# Patient Record
Sex: Male | Born: 1965 | Race: White | Hispanic: No | Marital: Single | Smoking: Former smoker
Health system: Southern US, Community
[De-identification: ages and names within clinical notes are randomized; demographics above are authoritative.]

## PROBLEM LIST (undated history)

## (undated) DIAGNOSIS — E119 Type 2 diabetes mellitus without complications: Secondary | ICD-10-CM

## (undated) DIAGNOSIS — I10 Essential (primary) hypertension: Secondary | ICD-10-CM

## (undated) DIAGNOSIS — N189 Chronic kidney disease, unspecified: Secondary | ICD-10-CM

## (undated) DIAGNOSIS — J449 Chronic obstructive pulmonary disease, unspecified: Secondary | ICD-10-CM

## (undated) DIAGNOSIS — J45909 Unspecified asthma, uncomplicated: Secondary | ICD-10-CM

---

## 2000-10-07 ENCOUNTER — Emergency Department (HOSPITAL_COMMUNITY): Admission: EM | Admit: 2000-10-07 | Discharge: 2000-10-07 | Payer: Self-pay | Admitting: Emergency Medicine

## 2000-10-07 ENCOUNTER — Encounter: Payer: Self-pay | Admitting: Emergency Medicine

## 2019-03-31 ENCOUNTER — Other Ambulatory Visit: Payer: Self-pay | Admitting: Nephrology

## 2019-03-31 DIAGNOSIS — N183 Chronic kidney disease, stage 3 unspecified: Secondary | ICD-10-CM

## 2019-04-20 ENCOUNTER — Ambulatory Visit
Admission: RE | Admit: 2019-04-20 | Discharge: 2019-04-20 | Disposition: A | Discharge status: Home / Self Care | Payer: 59 | Source: Ambulatory Visit | Attending: Nephrology | Admitting: Nephrology

## 2019-04-20 DIAGNOSIS — N183 Chronic kidney disease, stage 3 unspecified: Secondary | ICD-10-CM

## 2019-07-12 DIAGNOSIS — Z0000 Encounter for general adult medical examination without abnormal findings: Secondary | ICD-10-CM | POA: Diagnosis not present

## 2019-07-12 DIAGNOSIS — Z23 Encounter for immunization: Secondary | ICD-10-CM | POA: Diagnosis not present

## 2019-07-12 DIAGNOSIS — Z1211 Encounter for screening for malignant neoplasm of colon: Secondary | ICD-10-CM | POA: Diagnosis not present

## 2019-07-12 DIAGNOSIS — I10 Essential (primary) hypertension: Secondary | ICD-10-CM | POA: Diagnosis not present

## 2019-07-19 DIAGNOSIS — E1122 Type 2 diabetes mellitus with diabetic chronic kidney disease: Secondary | ICD-10-CM | POA: Diagnosis not present

## 2019-07-19 DIAGNOSIS — N183 Chronic kidney disease, stage 3 unspecified: Secondary | ICD-10-CM | POA: Diagnosis not present

## 2019-07-28 DIAGNOSIS — E875 Hyperkalemia: Secondary | ICD-10-CM | POA: Diagnosis not present

## 2019-07-28 DIAGNOSIS — I129 Hypertensive chronic kidney disease with stage 1 through stage 4 chronic kidney disease, or unspecified chronic kidney disease: Secondary | ICD-10-CM | POA: Diagnosis not present

## 2019-07-28 DIAGNOSIS — R809 Proteinuria, unspecified: Secondary | ICD-10-CM | POA: Diagnosis not present

## 2019-07-28 DIAGNOSIS — N183 Chronic kidney disease, stage 3 unspecified: Secondary | ICD-10-CM | POA: Diagnosis not present

## 2019-08-18 DIAGNOSIS — Z20828 Contact with and (suspected) exposure to other viral communicable diseases: Secondary | ICD-10-CM | POA: Diagnosis not present

## 2019-08-18 DIAGNOSIS — J329 Chronic sinusitis, unspecified: Secondary | ICD-10-CM | POA: Diagnosis not present

## 2019-08-18 DIAGNOSIS — J0140 Acute pansinusitis, unspecified: Secondary | ICD-10-CM | POA: Diagnosis not present

## 2019-10-12 DIAGNOSIS — E1165 Type 2 diabetes mellitus with hyperglycemia: Secondary | ICD-10-CM | POA: Diagnosis not present

## 2019-10-12 DIAGNOSIS — E1122 Type 2 diabetes mellitus with diabetic chronic kidney disease: Secondary | ICD-10-CM | POA: Diagnosis not present

## 2019-10-12 DIAGNOSIS — M25512 Pain in left shoulder: Secondary | ICD-10-CM | POA: Diagnosis not present

## 2019-10-12 DIAGNOSIS — E782 Mixed hyperlipidemia: Secondary | ICD-10-CM | POA: Diagnosis not present

## 2019-10-12 DIAGNOSIS — N1832 Chronic kidney disease, stage 3b: Secondary | ICD-10-CM | POA: Diagnosis not present

## 2019-10-12 DIAGNOSIS — Z79899 Other long term (current) drug therapy: Secondary | ICD-10-CM | POA: Diagnosis not present

## 2019-10-12 DIAGNOSIS — I129 Hypertensive chronic kidney disease with stage 1 through stage 4 chronic kidney disease, or unspecified chronic kidney disease: Secondary | ICD-10-CM | POA: Diagnosis not present

## 2019-10-12 DIAGNOSIS — L989 Disorder of the skin and subcutaneous tissue, unspecified: Secondary | ICD-10-CM | POA: Diagnosis not present

## 2019-10-20 DIAGNOSIS — H61002 Unspecified perichondritis of left external ear: Secondary | ICD-10-CM | POA: Diagnosis not present

## 2019-10-20 DIAGNOSIS — H61032 Chondritis of left external ear: Secondary | ICD-10-CM | POA: Diagnosis not present

## 2019-10-20 DIAGNOSIS — D492 Neoplasm of unspecified behavior of bone, soft tissue, and skin: Secondary | ICD-10-CM | POA: Diagnosis not present

## 2019-12-01 DIAGNOSIS — E113592 Type 2 diabetes mellitus with proliferative diabetic retinopathy without macular edema, left eye: Secondary | ICD-10-CM | POA: Diagnosis not present

## 2019-12-01 DIAGNOSIS — E113491 Type 2 diabetes mellitus with severe nonproliferative diabetic retinopathy without macular edema, right eye: Secondary | ICD-10-CM | POA: Diagnosis not present

## 2020-03-23 DIAGNOSIS — H3582 Retinal ischemia: Secondary | ICD-10-CM | POA: Diagnosis not present

## 2020-03-23 DIAGNOSIS — H35033 Hypertensive retinopathy, bilateral: Secondary | ICD-10-CM | POA: Diagnosis not present

## 2020-03-23 DIAGNOSIS — E113491 Type 2 diabetes mellitus with severe nonproliferative diabetic retinopathy without macular edema, right eye: Secondary | ICD-10-CM | POA: Diagnosis not present

## 2020-03-23 DIAGNOSIS — E113592 Type 2 diabetes mellitus with proliferative diabetic retinopathy without macular edema, left eye: Secondary | ICD-10-CM | POA: Diagnosis not present

## 2020-04-03 DIAGNOSIS — E782 Mixed hyperlipidemia: Secondary | ICD-10-CM | POA: Diagnosis not present

## 2020-04-03 DIAGNOSIS — E1122 Type 2 diabetes mellitus with diabetic chronic kidney disease: Secondary | ICD-10-CM | POA: Diagnosis not present

## 2020-04-03 DIAGNOSIS — I10 Essential (primary) hypertension: Secondary | ICD-10-CM | POA: Diagnosis not present

## 2020-04-03 DIAGNOSIS — N1832 Chronic kidney disease, stage 3b: Secondary | ICD-10-CM | POA: Diagnosis not present

## 2020-04-03 DIAGNOSIS — E1165 Type 2 diabetes mellitus with hyperglycemia: Secondary | ICD-10-CM | POA: Diagnosis not present

## 2020-04-03 DIAGNOSIS — I129 Hypertensive chronic kidney disease with stage 1 through stage 4 chronic kidney disease, or unspecified chronic kidney disease: Secondary | ICD-10-CM | POA: Diagnosis not present

## 2020-04-18 DIAGNOSIS — Z1211 Encounter for screening for malignant neoplasm of colon: Secondary | ICD-10-CM | POA: Diagnosis not present

## 2020-06-05 DIAGNOSIS — K635 Polyp of colon: Secondary | ICD-10-CM | POA: Diagnosis not present

## 2020-06-05 DIAGNOSIS — Z1211 Encounter for screening for malignant neoplasm of colon: Secondary | ICD-10-CM | POA: Diagnosis not present

## 2020-06-05 DIAGNOSIS — D120 Benign neoplasm of cecum: Secondary | ICD-10-CM | POA: Diagnosis not present

## 2020-06-05 DIAGNOSIS — K621 Rectal polyp: Secondary | ICD-10-CM | POA: Diagnosis not present

## 2020-07-05 DIAGNOSIS — I10 Essential (primary) hypertension: Secondary | ICD-10-CM | POA: Diagnosis not present

## 2020-07-05 DIAGNOSIS — E1165 Type 2 diabetes mellitus with hyperglycemia: Secondary | ICD-10-CM | POA: Diagnosis not present

## 2020-07-05 DIAGNOSIS — Z23 Encounter for immunization: Secondary | ICD-10-CM | POA: Diagnosis not present

## 2020-07-13 DIAGNOSIS — H3582 Retinal ischemia: Secondary | ICD-10-CM | POA: Diagnosis not present

## 2020-07-13 DIAGNOSIS — E113592 Type 2 diabetes mellitus with proliferative diabetic retinopathy without macular edema, left eye: Secondary | ICD-10-CM | POA: Diagnosis not present

## 2020-07-13 DIAGNOSIS — H35033 Hypertensive retinopathy, bilateral: Secondary | ICD-10-CM | POA: Diagnosis not present

## 2020-07-13 DIAGNOSIS — E113491 Type 2 diabetes mellitus with severe nonproliferative diabetic retinopathy without macular edema, right eye: Secondary | ICD-10-CM | POA: Diagnosis not present

## 2020-10-04 DIAGNOSIS — I10 Essential (primary) hypertension: Secondary | ICD-10-CM | POA: Diagnosis not present

## 2020-10-26 DIAGNOSIS — U071 COVID-19: Secondary | ICD-10-CM | POA: Diagnosis not present

## 2021-01-03 DIAGNOSIS — I129 Hypertensive chronic kidney disease with stage 1 through stage 4 chronic kidney disease, or unspecified chronic kidney disease: Secondary | ICD-10-CM | POA: Diagnosis not present

## 2021-01-03 DIAGNOSIS — N1832 Chronic kidney disease, stage 3b: Secondary | ICD-10-CM | POA: Diagnosis not present

## 2021-01-03 DIAGNOSIS — I10 Essential (primary) hypertension: Secondary | ICD-10-CM | POA: Diagnosis not present

## 2021-01-03 DIAGNOSIS — E782 Mixed hyperlipidemia: Secondary | ICD-10-CM | POA: Diagnosis not present

## 2021-01-03 DIAGNOSIS — E1165 Type 2 diabetes mellitus with hyperglycemia: Secondary | ICD-10-CM | POA: Diagnosis not present

## 2021-01-03 DIAGNOSIS — E1122 Type 2 diabetes mellitus with diabetic chronic kidney disease: Secondary | ICD-10-CM | POA: Diagnosis not present

## 2021-02-12 DIAGNOSIS — I129 Hypertensive chronic kidney disease with stage 1 through stage 4 chronic kidney disease, or unspecified chronic kidney disease: Secondary | ICD-10-CM | POA: Diagnosis not present

## 2021-02-12 DIAGNOSIS — N281 Cyst of kidney, acquired: Secondary | ICD-10-CM | POA: Diagnosis not present

## 2021-02-12 DIAGNOSIS — N184 Chronic kidney disease, stage 4 (severe): Secondary | ICD-10-CM | POA: Diagnosis not present

## 2021-02-12 DIAGNOSIS — E875 Hyperkalemia: Secondary | ICD-10-CM | POA: Diagnosis not present

## 2021-02-12 DIAGNOSIS — R809 Proteinuria, unspecified: Secondary | ICD-10-CM | POA: Diagnosis not present

## 2021-02-14 ENCOUNTER — Other Ambulatory Visit: Payer: Self-pay | Admitting: Nephrology

## 2021-02-14 DIAGNOSIS — N184 Chronic kidney disease, stage 4 (severe): Secondary | ICD-10-CM

## 2021-02-23 ENCOUNTER — Ambulatory Visit
Admission: RE | Admit: 2021-02-23 | Discharge: 2021-02-23 | Disposition: A | Discharge status: Home / Self Care | Payer: 59 | Source: Ambulatory Visit | Attending: Nephrology | Admitting: Nephrology

## 2021-02-23 DIAGNOSIS — N184 Chronic kidney disease, stage 4 (severe): Secondary | ICD-10-CM

## 2021-02-23 DIAGNOSIS — N281 Cyst of kidney, acquired: Secondary | ICD-10-CM | POA: Diagnosis not present

## 2021-04-02 DIAGNOSIS — E782 Mixed hyperlipidemia: Secondary | ICD-10-CM | POA: Diagnosis not present

## 2021-04-02 DIAGNOSIS — N1832 Chronic kidney disease, stage 3b: Secondary | ICD-10-CM | POA: Diagnosis not present

## 2021-04-02 DIAGNOSIS — Z0000 Encounter for general adult medical examination without abnormal findings: Secondary | ICD-10-CM | POA: Diagnosis not present

## 2021-04-02 DIAGNOSIS — Z125 Encounter for screening for malignant neoplasm of prostate: Secondary | ICD-10-CM | POA: Diagnosis not present

## 2021-04-02 DIAGNOSIS — I129 Hypertensive chronic kidney disease with stage 1 through stage 4 chronic kidney disease, or unspecified chronic kidney disease: Secondary | ICD-10-CM | POA: Diagnosis not present

## 2021-04-02 DIAGNOSIS — E1165 Type 2 diabetes mellitus with hyperglycemia: Secondary | ICD-10-CM | POA: Diagnosis not present

## 2021-04-23 DIAGNOSIS — N184 Chronic kidney disease, stage 4 (severe): Secondary | ICD-10-CM | POA: Diagnosis not present

## 2021-05-31 DIAGNOSIS — R809 Proteinuria, unspecified: Secondary | ICD-10-CM | POA: Diagnosis not present

## 2021-05-31 DIAGNOSIS — N2581 Secondary hyperparathyroidism of renal origin: Secondary | ICD-10-CM | POA: Diagnosis not present

## 2021-05-31 DIAGNOSIS — N184 Chronic kidney disease, stage 4 (severe): Secondary | ICD-10-CM | POA: Diagnosis not present

## 2021-05-31 DIAGNOSIS — N281 Cyst of kidney, acquired: Secondary | ICD-10-CM | POA: Diagnosis not present

## 2021-05-31 DIAGNOSIS — E875 Hyperkalemia: Secondary | ICD-10-CM | POA: Diagnosis not present

## 2021-05-31 DIAGNOSIS — I129 Hypertensive chronic kidney disease with stage 1 through stage 4 chronic kidney disease, or unspecified chronic kidney disease: Secondary | ICD-10-CM | POA: Diagnosis not present

## 2021-05-31 DIAGNOSIS — D631 Anemia in chronic kidney disease: Secondary | ICD-10-CM | POA: Diagnosis not present

## 2021-10-31 DIAGNOSIS — E7800 Pure hypercholesterolemia, unspecified: Secondary | ICD-10-CM | POA: Diagnosis not present

## 2021-10-31 DIAGNOSIS — N1832 Chronic kidney disease, stage 3b: Secondary | ICD-10-CM | POA: Diagnosis not present

## 2021-10-31 DIAGNOSIS — E119 Type 2 diabetes mellitus without complications: Secondary | ICD-10-CM | POA: Diagnosis not present

## 2021-10-31 DIAGNOSIS — I10 Essential (primary) hypertension: Secondary | ICD-10-CM | POA: Diagnosis not present

## 2021-11-01 ENCOUNTER — Encounter (HOSPITAL_COMMUNITY): Payer: Self-pay

## 2021-11-01 ENCOUNTER — Inpatient Hospital Stay (HOSPITAL_COMMUNITY): Payer: BC Managed Care – PPO

## 2021-11-01 ENCOUNTER — Other Ambulatory Visit: Payer: Self-pay

## 2021-11-01 ENCOUNTER — Inpatient Hospital Stay (HOSPITAL_COMMUNITY)
Admission: EM | Admit: 2021-11-01 | Discharge: 2021-11-09 | DRG: 673 | Disposition: A | Discharge status: Home / Self Care | Payer: BC Managed Care – PPO | Attending: Internal Medicine | Admitting: Internal Medicine

## 2021-11-01 DIAGNOSIS — I4891 Unspecified atrial fibrillation: Secondary | ICD-10-CM | POA: Diagnosis not present

## 2021-11-01 DIAGNOSIS — E1169 Type 2 diabetes mellitus with other specified complication: Secondary | ICD-10-CM | POA: Diagnosis present

## 2021-11-01 DIAGNOSIS — N184 Chronic kidney disease, stage 4 (severe): Secondary | ICD-10-CM | POA: Diagnosis not present

## 2021-11-01 DIAGNOSIS — I5043 Acute on chronic combined systolic (congestive) and diastolic (congestive) heart failure: Secondary | ICD-10-CM | POA: Diagnosis not present

## 2021-11-01 DIAGNOSIS — R0600 Dyspnea, unspecified: Secondary | ICD-10-CM | POA: Diagnosis not present

## 2021-11-01 DIAGNOSIS — I509 Heart failure, unspecified: Secondary | ICD-10-CM

## 2021-11-01 DIAGNOSIS — Z992 Dependence on renal dialysis: Secondary | ICD-10-CM

## 2021-11-01 DIAGNOSIS — Z7982 Long term (current) use of aspirin: Secondary | ICD-10-CM

## 2021-11-01 DIAGNOSIS — R195 Other fecal abnormalities: Secondary | ICD-10-CM | POA: Diagnosis not present

## 2021-11-01 DIAGNOSIS — Z86010 Personal history of colonic polyps: Secondary | ICD-10-CM

## 2021-11-01 DIAGNOSIS — N179 Acute kidney failure, unspecified: Principal | ICD-10-CM | POA: Diagnosis present

## 2021-11-01 DIAGNOSIS — Z4901 Encounter for fitting and adjustment of extracorporeal dialysis catheter: Secondary | ICD-10-CM | POA: Diagnosis not present

## 2021-11-01 DIAGNOSIS — Z7985 Long-term (current) use of injectable non-insulin antidiabetic drugs: Secondary | ICD-10-CM

## 2021-11-01 DIAGNOSIS — D123 Benign neoplasm of transverse colon: Secondary | ICD-10-CM | POA: Diagnosis not present

## 2021-11-01 DIAGNOSIS — E872 Acidosis, unspecified: Secondary | ICD-10-CM | POA: Diagnosis present

## 2021-11-01 DIAGNOSIS — I517 Cardiomegaly: Secondary | ICD-10-CM | POA: Diagnosis not present

## 2021-11-01 DIAGNOSIS — D649 Anemia, unspecified: Secondary | ICD-10-CM | POA: Diagnosis not present

## 2021-11-01 DIAGNOSIS — N186 End stage renal disease: Secondary | ICD-10-CM | POA: Diagnosis not present

## 2021-11-01 DIAGNOSIS — I129 Hypertensive chronic kidney disease with stage 1 through stage 4 chronic kidney disease, or unspecified chronic kidney disease: Secondary | ICD-10-CM | POA: Diagnosis not present

## 2021-11-01 DIAGNOSIS — D638 Anemia in other chronic diseases classified elsewhere: Secondary | ICD-10-CM | POA: Diagnosis not present

## 2021-11-01 DIAGNOSIS — K2980 Duodenitis without bleeding: Secondary | ICD-10-CM | POA: Diagnosis present

## 2021-11-01 DIAGNOSIS — I132 Hypertensive heart and chronic kidney disease with heart failure and with stage 5 chronic kidney disease, or end stage renal disease: Secondary | ICD-10-CM | POA: Diagnosis not present

## 2021-11-01 DIAGNOSIS — I10 Essential (primary) hypertension: Secondary | ICD-10-CM | POA: Diagnosis present

## 2021-11-01 DIAGNOSIS — E785 Hyperlipidemia, unspecified: Secondary | ICD-10-CM | POA: Diagnosis present

## 2021-11-01 DIAGNOSIS — F17220 Nicotine dependence, chewing tobacco, uncomplicated: Secondary | ICD-10-CM | POA: Diagnosis present

## 2021-11-01 DIAGNOSIS — D62 Acute posthemorrhagic anemia: Secondary | ICD-10-CM | POA: Diagnosis present

## 2021-11-01 DIAGNOSIS — K635 Polyp of colon: Secondary | ICD-10-CM | POA: Diagnosis not present

## 2021-11-01 DIAGNOSIS — D631 Anemia in chronic kidney disease: Secondary | ICD-10-CM | POA: Diagnosis present

## 2021-11-01 DIAGNOSIS — R0602 Shortness of breath: Secondary | ICD-10-CM | POA: Diagnosis present

## 2021-11-01 DIAGNOSIS — N2581 Secondary hyperparathyroidism of renal origin: Secondary | ICD-10-CM | POA: Diagnosis present

## 2021-11-01 DIAGNOSIS — N189 Chronic kidney disease, unspecified: Secondary | ICD-10-CM | POA: Diagnosis not present

## 2021-11-01 DIAGNOSIS — J9811 Atelectasis: Secondary | ICD-10-CM | POA: Diagnosis not present

## 2021-11-01 DIAGNOSIS — K922 Gastrointestinal hemorrhage, unspecified: Secondary | ICD-10-CM

## 2021-11-01 DIAGNOSIS — K5730 Diverticulosis of large intestine without perforation or abscess without bleeding: Secondary | ICD-10-CM | POA: Diagnosis not present

## 2021-11-01 DIAGNOSIS — A048 Other specified bacterial intestinal infections: Secondary | ICD-10-CM | POA: Diagnosis present

## 2021-11-01 DIAGNOSIS — N19 Unspecified kidney failure: Secondary | ICD-10-CM | POA: Diagnosis not present

## 2021-11-01 DIAGNOSIS — Z833 Family history of diabetes mellitus: Secondary | ICD-10-CM | POA: Diagnosis not present

## 2021-11-01 DIAGNOSIS — E1122 Type 2 diabetes mellitus with diabetic chronic kidney disease: Secondary | ICD-10-CM | POA: Diagnosis present

## 2021-11-01 DIAGNOSIS — J9601 Acute respiratory failure with hypoxia: Secondary | ICD-10-CM | POA: Diagnosis present

## 2021-11-01 DIAGNOSIS — Z20822 Contact with and (suspected) exposure to covid-19: Secondary | ICD-10-CM | POA: Diagnosis not present

## 2021-11-01 DIAGNOSIS — K2951 Unspecified chronic gastritis with bleeding: Secondary | ICD-10-CM | POA: Diagnosis not present

## 2021-11-01 DIAGNOSIS — Z79899 Other long term (current) drug therapy: Secondary | ICD-10-CM

## 2021-11-01 DIAGNOSIS — D509 Iron deficiency anemia, unspecified: Secondary | ICD-10-CM | POA: Diagnosis not present

## 2021-11-01 DIAGNOSIS — R918 Other nonspecific abnormal finding of lung field: Secondary | ICD-10-CM | POA: Diagnosis not present

## 2021-11-01 DIAGNOSIS — E8729 Other acidosis: Secondary | ICD-10-CM

## 2021-11-01 DIAGNOSIS — K2950 Unspecified chronic gastritis without bleeding: Secondary | ICD-10-CM | POA: Diagnosis present

## 2021-11-01 DIAGNOSIS — E1110 Type 2 diabetes mellitus with ketoacidosis without coma: Secondary | ICD-10-CM | POA: Diagnosis not present

## 2021-11-01 DIAGNOSIS — D500 Iron deficiency anemia secondary to blood loss (chronic): Secondary | ICD-10-CM | POA: Diagnosis not present

## 2021-11-01 HISTORY — DX: Chronic obstructive pulmonary disease, unspecified: J44.9

## 2021-11-01 HISTORY — DX: Chronic kidney disease, unspecified: N18.9

## 2021-11-01 HISTORY — DX: Type 2 diabetes mellitus without complications: E11.9

## 2021-11-01 HISTORY — DX: Unspecified asthma, uncomplicated: J45.909

## 2021-11-01 HISTORY — DX: Essential (primary) hypertension: I10

## 2021-11-01 LAB — COMPREHENSIVE METABOLIC PANEL
ALT: 21 U/L (ref 0–44)
AST: 13 U/L — ABNORMAL LOW (ref 15–41)
Albumin: 3.9 g/dL (ref 3.5–5.0)
Alkaline Phosphatase: 67 U/L (ref 38–126)
Anion gap: 20 — ABNORMAL HIGH (ref 5–15)
BUN: 140 mg/dL — ABNORMAL HIGH (ref 6–20)
CO2: 18 mmol/L — ABNORMAL LOW (ref 22–32)
Calcium: 7.7 mg/dL — ABNORMAL LOW (ref 8.9–10.3)
Chloride: 98 mmol/L (ref 98–111)
Creatinine, Ser: 14.18 mg/dL — ABNORMAL HIGH (ref 0.61–1.24)
GFR, Estimated: 4 mL/min — ABNORMAL LOW (ref >60)
Glucose, Bld: 113 mg/dL — ABNORMAL HIGH (ref 70–99)
Potassium: 4.4 mmol/L (ref 3.5–5.1)
Sodium: 136 mmol/L (ref 135–145)
Total Bilirubin: 0.7 mg/dL (ref 0.3–1.2)
Total Protein: 6.8 g/dL (ref 6.5–8.1)

## 2021-11-01 LAB — CBC WITH DIFFERENTIAL/PLATELET
Abs Immature Granulocytes: 0.03 10*3/uL (ref 0.00–0.07)
Basophils Absolute: 0 10*3/uL (ref 0.0–0.1)
Basophils Relative: 0 %
Eosinophils Absolute: 0.1 10*3/uL (ref 0.0–0.5)
Eosinophils Relative: 1 %
HCT: 20.8 % — ABNORMAL LOW (ref 39.0–52.0)
Hemoglobin: 7.3 g/dL — ABNORMAL LOW (ref 13.0–17.0)
Immature Granulocytes: 0 %
Lymphocytes Relative: 9 %
Lymphs Abs: 0.7 10*3/uL (ref 0.7–4.0)
MCH: 29.9 pg (ref 26.0–34.0)
MCHC: 35.1 g/dL (ref 30.0–36.0)
MCV: 85.2 fL (ref 80.0–100.0)
Monocytes Absolute: 0.5 10*3/uL (ref 0.1–1.0)
Monocytes Relative: 6 %
Neutro Abs: 6.3 10*3/uL (ref 1.7–7.7)
Neutrophils Relative %: 84 %
Platelets: 127 10*3/uL — ABNORMAL LOW (ref 150–400)
RBC: 2.44 MIL/uL — ABNORMAL LOW (ref 4.22–5.81)
RDW: 13.2 % (ref 11.5–15.5)
WBC: 7.6 10*3/uL (ref 4.0–10.5)
nRBC: 0 % (ref 0.0–0.2)

## 2021-11-01 LAB — URINALYSIS, ROUTINE W REFLEX MICROSCOPIC
Bilirubin Urine: NEGATIVE
Glucose, UA: 150 mg/dL — AB
Ketones, ur: 5 mg/dL — AB
Leukocytes,Ua: NEGATIVE
Nitrite: NEGATIVE
Protein, ur: >=300 mg/dL — AB
Specific Gravity, Urine: 1.01 (ref 1.005–1.030)
pH: 5 (ref 5.0–8.0)

## 2021-11-01 LAB — POC OCCULT BLOOD, ED: Fecal Occult Bld: POSITIVE — AB

## 2021-11-01 LAB — RESP PANEL BY RT-PCR (FLU A&B, COVID) ARPGX2
Influenza A by PCR: NEGATIVE
Influenza B by PCR: NEGATIVE
SARS Coronavirus 2 by RT PCR: NEGATIVE

## 2021-11-01 LAB — BRAIN NATRIURETIC PEPTIDE: B Natriuretic Peptide: 933.7 pg/mL — ABNORMAL HIGH (ref 0.0–100.0)

## 2021-11-01 LAB — PREPARE RBC (CROSSMATCH)

## 2021-11-01 LAB — ABO/RH: ABO/RH(D): A POS

## 2021-11-01 MED ORDER — METOPROLOL TARTRATE 5 MG/5ML IV SOLN
5.0000 mg | Freq: Once | INTRAVENOUS | Status: AC
Start: 2021-11-01 — End: 2021-11-02
  Administered 2021-11-02: 5 mg via INTRAVENOUS
  Filled 2021-11-01: qty 5

## 2021-11-01 MED ORDER — SODIUM CHLORIDE 0.9 % IV SOLN
10.0000 mL/h | Freq: Once | INTRAVENOUS | Status: AC
Start: 2021-11-01 — End: 2021-11-01
  Administered 2021-11-01: 10 mL/h via INTRAVENOUS

## 2021-11-01 MED ORDER — CHLORHEXIDINE GLUCONATE CLOTH 2 % EX PADS
6.0000 | MEDICATED_PAD | Freq: Every day | CUTANEOUS | Status: DC
  Administered 2021-11-02 – 2021-11-07 (×5): 6 via TOPICAL

## 2021-11-01 MED ORDER — FUROSEMIDE 10 MG/ML IJ SOLN
160.0000 mg | Freq: Once | INTRAVENOUS | Status: AC
  Administered 2021-11-01: 160 mg via INTRAVENOUS
  Filled 2021-11-01: qty 16

## 2021-11-01 NOTE — ED Provider Triage Note (Signed)
Emergency Medicine Provider Triage Evaluation Note  Allen Mathis , a 56 y.o. male  was evaluated in triage.  Pt complains of abnormal lab, had urine analysis by PCP which showed protein present.  Patient endorses urinating small amounts for the past 3 weeks, has also had some nausea along with vomiting.  He is followed by Dr. Carolin Sicks of nephrology  Review of Systems  Positive: Urinary symptoms, nausea, vomiting Negative: Fever, flank pain  Physical Exam  BP (!) 176/90 (BP Location: Left Arm)    Pulse 95    Temp 98 F (36.7 C) (Oral)    Resp 18    Ht 5\' 5"  (1.651 m)    Wt 79.4 kg    SpO2 96%    BMI 29.12 kg/m  Gen:   Awake, no distress   Resp:  Normal effort  MSK:   Moves extremities without difficulty  Other:    Medical Decision Making  Medically screening exam initiated at 1:01 PM.  Appropriate orders placed.  [name] was informed that the remainder of the evaluation will be completed by another provider, this initial triage assessment does not replace that evaluation, and the importance of remaining in the ED until their evaluation is complete.     Janeece Fitting, PA-C 11/01/21 1304

## 2021-11-01 NOTE — H&P (Addendum)
History and Physical    Allen Mathis XFG:[id number] DOB: 01/02/1966 DOA: 11/01/2021  PCP: Lyman Bishop, DO  Patient coming from: Home  I have personally briefly reviewed patient's old medical records in [place]  Chief Complaint: nausea, vomiting, diarrhea  HPI: Allen Mathis is a 56 y.o. male with medical history significant for HTN, HLD, Type 2 DM, CKD stage 4 who presents with nausea, vomiting, diarrhea.   He has been feeling sick for about 3 weeks with nausea,vomiting and diarrhea. No abdominal pain. Feels weak and dizzy. Went to a new primary doctor and had blood work yesterday and found to have worsening renal function and asked to come to ED. Past 2 days also more acutely short of breath,has orthopnea, and has mild cough. Slight ankle edema. No chest pain. Has urinary dribbling for about 8 months and feels he is urinating less in the past week.  Denies NSAID use other than daily aspirin.  No alcohol use.  ED Course:  He was afebrile, hypertensive up to 190 over 80s on 6 L.  No leukocytosis, hemoglobin of 7.3 down from 12.7 several months ago. Creatinine of 14 from previous of 3.78 back in June.  Anion gap of 20.  UA shows negative leukocyte, nitrite but had greater than 300 protein.  FOBT positive but per ED PA no melena or bright red blood was noted on exam.  Review of Systems:  No other pertinent positives or negatives other than stated above in HPI  Social history Chews tobacco daily, occasional alcohol use.  Previous history of marijuana use but not recently.  History reviewed. No pertinent surgical history.  No Known Allergies  Family History  Problem Relation Age of Onset   Diabetes Mother    Diabetes Father      Prior to Admission medications   Not on File    Physical Exam: Vitals:   11/01/21 1412 11/01/21 1546 11/01/21 1739 11/01/21 1816  BP: (!) 174/96 (!) 174/89 (!) 203/104 (!) 181/91  Pulse: 84 85 97 96  Resp: 18 17 (!) 25 (!)  25  Temp:   (!) 97.5 F (36.4 C)   TempSrc:   Oral   SpO2: 94% 91% 94% 94%  Weight:      Height:        Constitutional: Ill-appearing elderly gentleman appearing older than stated age sitting upright in bed with labored respiration Vitals:   11/01/21 1412 11/01/21 1546 11/01/21 1739 11/01/21 1816  BP: (!) 174/96 (!) 174/89 (!) 203/104 (!) 181/91  Pulse: 84 85 97 96  Resp: 18 17 (!) 25 (!) 25  Temp:   (!) 97.5 F (36.4 C)   TempSrc:   Oral   SpO2: 94% 91% 94% 94%  Weight:      Height:       Eyes:  lids and conjunctivae normal ENMT: Mucous membranes are moist.  Neck: normal, supple Respiratory: Left base crackles, labored respiration on 6 L via nasal cannula but no use of accessory muscle.  Able to speak in full sentences. cardiovascular: Regular rate and rhythm, no murmurs / rubs / gallops.  +2 pitting edema of distal lower extremity.   Abdomen: no tenderness,  Bowel sounds positive.  Musculoskeletal: no clubbing / cyanosis. No joint deformity upper and lower extremities. Good ROM, no contractures. Normal muscle tone.  Skin: no rashes, lesions, ulcers. No induration Neurologic: CN 2-12 grossly intact.  Strength 5/5 in all 4.  Psychiatric: Normal judgment and insight. Alert and oriented  x 3. Normal mood.     Labs on Admission: I have personally reviewed following labs and imaging studies  CBC: Recent Labs  Lab 11/01/21 1307  WBC 7.6  NEUTROABS 6.3  HGB 7.3*  HCT 20.8*  MCV 85.2  PLT 433*   Basic Metabolic Panel: Recent Labs  Lab 11/01/21 1307  NA 136  K 4.4  CL 98  CO2 18*  GLUCOSE 113*  BUN 140*  CREATININE 14.18*  CALCIUM 7.7*   GFR: Estimated Creatinine Clearance: 5.6 mL/min (A) (by C-G formula based on SCr of 14.18 mg/dL (H)). Liver Function Tests: Recent Labs  Lab 11/01/21 1307  AST 13*  ALT 21  ALKPHOS 67  BILITOT 0.7  PROT 6.8  ALBUMIN 3.9   No results for input(s): LIPASE, AMYLASE in the last 168 hours. No results for input(s): AMMONIA  in the last 168 hours. Coagulation Profile: No results for input(s): INR, PROTIME in the last 168 hours. Cardiac Enzymes: No results for input(s): CKTOTAL, CKMB, CKMBINDEX, TROPONINI in the last 168 hours. BNP (last 3 results) No results for input(s): PROBNP in the last 8760 hours. HbA1C: No results for input(s): HGBA1C in the last 72 hours. CBG: No results for input(s): GLUCAP in the last 168 hours. Lipid Profile: No results for input(s): CHOL, HDL, LDLCALC, TRIG, CHOLHDL, LDLDIRECT in the last 72 hours. Thyroid Function Tests: No results for input(s): TSH, T4TOTAL, FREET4, T3FREE, THYROIDAB in the last 72 hours. Anemia Panel: No results for input(s): VITAMINB12, FOLATE, FERRITIN, TIBC, IRON, RETICCTPCT in the last 72 hours. Urine analysis:    Component Value Date/Time   COLORURINE STRAW (A) 11/01/2021 1307   APPEARANCEUR CLEAR 11/01/2021 1307   LABSPEC 1.010 11/01/2021 1307   PHURINE 5.0 11/01/2021 1307   GLUCOSEU 150 (A) 11/01/2021 1307   HGBUR MODERATE (A) 11/01/2021 1307   BILIRUBINUR NEGATIVE 11/01/2021 1307   KETONESUR 5 (A) 11/01/2021 1307   PROTEINUR >=300 (A) 11/01/2021 1307   NITRITE NEGATIVE 11/01/2021 1307   LEUKOCYTESUR NEGATIVE 11/01/2021 1307    Radiological Exams on Admission: No results found.    Assessment/Plan  Acute renal failure w/hx of CKD stage IV -Creatinine of 14 from a prior of 3.78 back in June -renal ultrasound ruled out post-void obstruction -Pt to be transferred to [facility] for dialysis catheter placement and dialysis tomorrow -Monitor Is and Os, daily weights  -Avoid nephrotoxic agents, contrast study.  Renally dose medications. -Nephrology is following.  Appreciate recommendations.  Acute hypoxic respiratory failure -Multifactorial from suspected new CHF exacerbation, symptomatic anemia, volume overload from renal insufficiency -IV diuresis per nephrology overnight.  Hemodialysis in the morning.  New acute CHF exacerbation -CXR  obtained showing cardiomegaly with bilateral pleural effusion. BNP of 933 -obtain echocardiogram -IV Lasix 160mg  per nephrology. HD tomorrow.  -Strict intake and output, monitor daily weights  New onset atrial fibrillation with RVR -induced by new CHF, fluid overload -give one time dose of IV metoprolol and continue to monitor on telemetry -CHA2DS2-VASc score of 3 but not good candidate to start anticoagulation with symptomatic anemia and suspected GI bleed.   Symptomatic anemia -Hemoglobin of 7.3 down from 12.7 since June 2022 -suspect anemia of CKD and also possible GI source with positive FOBT -Last colonoscopy in 05/2020 at [place] to see results but pt says he did not have good prep and was advised to go back for repeat. ED PA did discuss with GI Dr. Michail Sermon and they will see in consultation. -will need to hold on transfusion  while he is fluid overloaded. Likely will need transfusion following dialysis. Appreciate nephrology on possible EPO.   Anion gap Metabolic acidosis secondary to Acute renal failure. Monitor after dialysis.   Type 2 diabetes Obtain hemoglobin A1c Hold on any insulin for now due to worsening renal function  Hypertension elevated. Received IV Lasix overnight. Continue amlodipine. Follow with HD tomorrow   Hyperlipidemia Continue statin  DVT prophylaxis:SCD Code Status: Full Family Communication: Plan discussed with patient and son at bedside  disposition Plan: Home with at least 2 midnight stays  Consults called: Nephrology Admission status: inpatient  Level of care: Telemetry Medical  Status is: Inpatient  Remains inpatient appropriate because: Admit - It is my clinical opinion that admission to INPATIENT is reasonable and necessary because this patient will require at least 2 midnights in the hospital to treat this condition based on the medical complexity of the problems presented.  Given the aforementioned information, the  predictability of an adverse outcome is felt to be significant.         Orene Desanctis DO [facility]   If 7PM-7AM, please contact night-coverage [email or web address]   11/01/2021, 7:22 PM

## 2021-11-01 NOTE — ED Notes (Signed)
Pt placed on 2L nasal cannula O2 levels did not come up until pt placed on 6 lpm and came up to 95%

## 2021-11-01 NOTE — ED Triage Notes (Addendum)
Patient was notified by his PCP that he had elevated protein in his urine and to come to the ED for further evaluation.  Patient added that he has been voiding frequent small amounts.

## 2021-11-01 NOTE — ED Notes (Signed)
Called lab and verified able to add BNP to previous drawn bloodwork

## 2021-11-01 NOTE — Consult Note (Signed)
Nephrology Consult   Requesting provider: Ileene Musa Service requesting consult: Hospitalist Reason for consult: AKI on CKD IV/V   Assessment/Recommendations: Allen Mathis is a/an 56 y.o. male with a past medical history DM2, HTN, CKD, HLD who present w/ acute renal failure   Severe acute renal failure on CKD IV/V: with uremic symptoms and likely excess volume. Given his fairly rapid progression I think this may represent advancement of his severe CKD but a component of AKI is possible -NPO at [facility] for dialysis catheter -IR consulted for [facility] placement; ideally [facility] but if he is too unstable can do temporary HD catheter more emergently -Dialysis ordered for tomorrow -Management of volume excess as below  -Continue to monitor daily Cr, Dose meds for GFR -Monitor Daily I/Os, Daily weight  -Maintain MAP>65 for optimal renal perfusion.  -Avoid nephrotoxic medications including NSAIDs and Vanc/Zosyn combo  Shortness of breath/volume overload: very dyspneic in the room likely related to volume overload and pulmonary edema -IV lasix 160mg  x1 -BNP and Chest x-ray -consider echocardiogram  Hypertension: diuresis as above. Consider additional agents PRN  Blood loss Anemia: possible GI bleeding. CKD playing a role. Would hold on transfusion until breathing status has improved with lasix. Then transfusions PRN  DM2: poorly controlled previously. mgmt per primary  Anion gap metabolic acidosis: likely mainly from renal failure. Plan for dialysis as above  Hypocalcemia: check PTH and phos   Recommendations conveyed to primary service.    [facility] 11/01/2021 8:00 PM   _____________________________________________________________________________________ CC: AKI on CKD 4/5  History of Present Illness: Allen Mathis is a/an 56 y.o. male with a past medical history of HTN, HLD DM2 who presents with abnormal labs found by PCP as well as SOB and  nausea.  Patient states that he felt himself about 4 weeks ago but over the past few weeks he has experienced worsening nausea and vomiting. He also has had several weeks of intermittent watery, non-bloody diarrhea. He has had a difficult time keeping food down and appetite has been poor. Over the past few days he has also noted worsening SOB and orthopnea. He has not had chest pain, fevers, or chills. He has not had hematuria but does say that his stream has been weak for about 6 months and sometimes it is difficult to void. He feels like he has continued to make a fair amount of urine. He also feels very weak and tired. Denies any NSAID use and does not take an ARB. He has not followed closely with his primary provider due to an insurance issue.  In the ER he was found to have a Crt of 14 and BUN of 140. Bicarb 18. UA with protein, moderate blood, but minimal cellularity. Hgb was 7.2 and fecal occult +. He has been hypertensive. RUS without obstruction.  Patient has followed with Dr. Carolin Sicks at [facility] and was last seen in August of 2022. He was lost to follow up but in August his creatinine was 4. Previously UPC was 4g. It is thought his CKD is 2/2 diabetes. Based on previous labs it appears that his Crt has worsened fairly rapidly in the past; going from 2 in 2021 to 4 in August of 2022.    Medications:  Current Facility-Administered Medications  Medication Dose Route Frequency Provider Last Rate Last Admin   furosemide (LASIX) 160 mg in dextrose 5 % 50 mL IVPB  160 mg Intravenous Once Reesa Chew, MD  Current Outpatient Medications  Medication Sig Dispense Refill   amLODipine (NORVASC) 5 MG tablet Take 1 tablet by mouth daily.     aspirin 81 MG EC tablet Take by mouth.     atorvastatin (LIPITOR) 20 MG tablet Take 20 mg by mouth daily.     omega-3 acid ethyl esters (LOVAZA) 1 g capsule Take 1 g by mouth daily.     TRULICITY 2.35 TD/3.2KG SOPN Inject 0.75 mg into the skin once a week.      vitamin B-12 (CYANOCOBALAMIN) 100 MCG tablet Take 100 mcg by mouth daily.       ALLERGIES Patient has no known allergies.  MEDICAL HISTORY History reviewed. No pertinent past medical history.   SOCIAL HISTORY Social History   Socioeconomic History   Marital status: Single    Spouse name: Not on file   Number of children: Not on file   Years of education: Not on file   Highest education level: Not on file  Occupational History   Not on file  Tobacco Use   Smoking status: Former    Types: Cigarettes   Smokeless tobacco: Current    Types: Snuff  Vaping Use   Vaping Use: Never used  Substance and Sexual Activity   Alcohol use: Yes   Drug use: Yes    Types: Marijuana   Sexual activity: Not on file  Other Topics Concern   Not on file  Social History Narrative   Not on file   Social Determinants of Health   Financial Resource Strain: Not on file  Food Insecurity: Not on file  Transportation Needs: Not on file  Physical Activity: Not on file  Stress: Not on file  Social Connections: Not on file  Intimate Partner Violence: Not on file     FAMILY HISTORY Family History  Problem Relation Age of Onset   Diabetes Mother    Diabetes Father       Review of Systems: 12 systems reviewed Otherwise as per HPI, all other systems reviewed and negative  Physical Exam: Vitals:   11/01/21 1915 11/01/21 1917  BP: (!) 182/91   Pulse: 97 94  Resp: (!) 23 (!) 23  Temp:    SpO2: 96% 96%   No intake/output data recorded. No intake or output data in the 24 hours ending 11/01/21 2000 General: ill appearing, mild distress HEENT: anicteric sclera, oropharynx clear without lesions CV: normal rate, no audible murmur, no rub Lungs: decreased breath sounds at the bases, mild crackles heard in right mid lung field, iwob Abd: soft, non-tender, non-distended Skin: no visible lesions or rashes Psych: alert, engaged, appropriate mood and affect Musculoskeletal: no obvious  deformities Neuro: normal speech, no gross focal deficits   Test Results Reviewed Lab Results  Component Value Date   NA 136 11/01/2021   K 4.4 11/01/2021   CL 98 11/01/2021   CO2 18 (L) 11/01/2021   BUN 140 (H) 11/01/2021   CREATININE 14.18 (H) 11/01/2021   CALCIUM 7.7 (L) 11/01/2021   ALBUMIN 3.9 11/01/2021     I have reviewed all relevant outside healthcare records related to the patient's current hospitalization

## 2021-11-01 NOTE — ED Provider Notes (Signed)
[facility] DEPT Provider Note   CSN: [id number] Arrival date & time: 11/01/21  1152     History  Chief Complaint  Patient presents with   Abnormal Lab    Allen Mathis is a 56 y.o. male.  The history is provided by the patient and a relative. No language interpreter was used.  Abnormal Lab  56 year old male sent here at the recommendation of his PCP due to having elevated protein in his urine.  Patient admits that he has a history of chronic kidney disease Stage IV.  He does follow-up with [facility].  He was seen by a PCP yesterday for regular well check and was notified today that his kidney function is abnormal and he needs to come to the ER for further assessment.  Patient admits for the past week and a half he has been feeling generalized weak.  He also endorsed having bouts of nausea, include with vomiting and having diarrhea he denies any abnormal bleeding no black tarry stool.  He denies alcohol abuse but admits to tobacco dipping.  He denies any chest pain or trouble breathing but does endorse generalized weakness.  He denies any recent medication changes.  No fever or cold symptoms.  He does make urine.  Report weak stream.  Home Medications Prior to Admission medications   Not on File      Allergies    Patient has no known allergies.    Review of Systems   Review of Systems  All other systems reviewed and are negative.  Physical Exam Updated Vital Signs BP (!) 174/89    Pulse 85    Temp 98 F (36.7 C) (Oral)    Resp 17    Ht 5\' 5"  (1.651 m)    Wt 79.4 kg    SpO2 91%    BMI 29.12 kg/m  Physical Exam Vitals and nursing note reviewed.  Constitutional:      General: He is not in acute distress.    Appearance: He is well-developed.  HENT:     Head: Atraumatic.  Eyes:     Conjunctiva/sclera: Conjunctivae normal.  Cardiovascular:     Rate and Rhythm: Normal rate and regular rhythm.     Pulses: Normal pulses.     Heart  sounds: Normal heart sounds.  Pulmonary:     Effort: Pulmonary effort is normal.     Breath sounds: Normal breath sounds.  Abdominal:     Palpations: Abdomen is soft.     Tenderness: There is no abdominal tenderness. There is no right CVA tenderness or left CVA tenderness.  Genitourinary:    Comments: Chaperone present during exam.  Normal rectal tone, no obvious mass, normal color stool on glove no obvious bleeding noted Musculoskeletal:     Cervical back: Neck supple.     Right lower leg: Edema present.     Left lower leg: Edema present.     Comments: Global weakness but equal strength.  Slight tremors  Trace edema to bilateral lower extremities  Skin:    Findings: No rash.  Neurological:     Mental Status: He is alert and oriented to person, place, and time.  Psychiatric:        Mood and Affect: Mood normal.    ED Results / Procedures / Treatments   Labs (all labs ordered are listed, but only abnormal results are displayed) Labs Reviewed  CBC WITH DIFFERENTIAL/PLATELET - Abnormal; Notable for the following components:  Result Value   RBC 2.44 (*)    Hemoglobin 7.3 (*)    HCT 20.8 (*)    Platelets 127 (*)    All other components within normal limits  COMPREHENSIVE METABOLIC PANEL - Abnormal; Notable for the following components:   CO2 18 (*)    Glucose, Bld 113 (*)    BUN 140 (*)    Creatinine, Ser 14.18 (*)    Calcium 7.7 (*)    AST 13 (*)    GFR, Estimated 4 (*)    Anion gap 20 (*)    All other components within normal limits  URINALYSIS, ROUTINE W REFLEX MICROSCOPIC - Abnormal; Notable for the following components:   Color, Urine STRAW (*)    Glucose, UA 150 (*)    Hgb urine dipstick MODERATE (*)    Ketones, ur 5 (*)    Protein, ur >=300 (*)    Bacteria, UA RARE (*)    All other components within normal limits  POC OCCULT BLOOD, ED - Abnormal; Notable for the following components:   Fecal Occult Bld POSITIVE (*)    All other components within normal  limits  URINE CULTURE  RESP PANEL BY RT-PCR (FLU A&B, COVID) ARPGX2  TYPE AND SCREEN  PREPARE RBC (CROSSMATCH)    EKG None  Radiology No results found.  Procedures .Critical Care Performed by: Domenic Moras, PA-C Authorized by: Domenic Moras, PA-C   Critical care provider statement:    Critical care time (minutes):  45   Critical care was time spent personally by me on the following activities:  Development of treatment plan with patient or surrogate, discussions with consultants, evaluation of patient's response to treatment, examination of patient, ordering and review of laboratory studies, ordering and review of radiographic studies, ordering and performing treatments and interventions, pulse oximetry, re-evaluation of patient's condition and review of old charts    Medications Ordered in ED Medications  0.9 %  sodium chloride infusion (10 mL/hr Intravenous New Bag/Given 11/01/21 1816)    ED Course/ Medical Decision Making/ A&P                           Medical Decision Making Amount and/or Complexity of Data Reviewed Labs: ordered. ECG/medicine tests: ordered.  Risk Prescription drug management.   BP (!) 174/89    Pulse 85    Temp 98 F (36.7 C) (Oral)    Resp 17    Ht 5\' 5"  (1.651 m)    Wt 79.4 kg    SpO2 91%    BMI 29.12 kg/m   4:47 PM This is a 56 year old male significant history of chronic kidney disease who presenting at the request of his PCP for concerns of kidney failure.  Work-up today remarkable for a BUN of 140, creatinine of 14.1  I do not have any prior value for comparison.  4:58 PM Patient has a hemoglobin of 7.3.  No prior value for comparison.  His anemia may be due to chronic kidney disease however since patient is symptomatic which includes weakness and dizziness, I discussed with patient and he is amenable for blood transfusion.  I did perform a digital rectal exam with normal color stool on glove, fecal occult blood test positive.  Will consult GI  for involvement.  5:40 PM I reviewed patient's outside notes from March 2022 when patient's creatinine was 3 at that time.  Therefore changed today significantly different from prior.  I have independently review  and interpret patient's labs.  5:50 PM Appreciate consultation from on call nephrologist Dr. Janan Halter who request pt to be transfer to [facility] for dialysis.  I have also contact [facility] Dr. Michail Sermon in regard to the fecal occult positive test and a Hgb of 7.3, they are agree to be involve in the care as well.  Will consult medicine for admission  I have also request 1 unit of PRBC to be transfuse.  Pt is agreeable with this plan.   6:36 PM Appreciate consultation from [facility] Dr. Flossie Buffy who agrees to see and will admit pt to [facility] for further care.   This patient presents to the ED for concern of acute renal failure, this involves an extensive number of treatment options, and is a complaint that carries with it a high risk of complications and morbidity.  The differential diagnosis includes nephrotic syndrome, ATN, nephrotoxicity, CKD, GIB, anemia, dehydration.  Co morbidities that complicate the patient evaluation CKD, DM Additional history obtained:  Additional history obtained from PCP visits External records from outside source obtained and reviewed including annual physical exam from 04/02/21 when his Hgb was 12, and Cr 3.0  Lab Tests:  I Ordered, and personally interpreted labs.  The pertinent results include:  BUN 140, Cr 14   Medicines ordered and prescription drug management:  I ordered medication including PRBC  for anemia Reevaluation of the patient after these medicines showed that the patient improved I have reviewed the patients home medicines and have made adjustments as needed   Critical Interventions: blood transfusion  Consultations Obtained:  I requested consultation with the GI specialist, nephrologist and hospitalist,  and discussed lab and  imaging findings as well as pertinent plan - they recommend: admission for dialysis and further work up  Problem List / ED Course: acute renal failure, symptomatic anemia, n/v/d  Reevaluation:  After the interventions noted above, I reevaluated the patient and found that they have :improved   Dispostion:  After consideration of the diagnostic results and the patients response to treatment, I feel that the patent would benefit from admission.         Final Clinical Impression(s) / ED Diagnoses Final diagnoses:  Symptomatic anemia  Acute renal failure, unspecified acute renal failure type [facility])    Rx / DC Orders ED Discharge Orders     None         Domenic Moras, PA-C 11/01/21 1843    Fredia Sorrow, MD 11/04/21 [phone]

## 2021-11-01 NOTE — ED Notes (Signed)
IV attempt x2 without success.

## 2021-11-02 ENCOUNTER — Inpatient Hospital Stay (HOSPITAL_COMMUNITY): Payer: BC Managed Care – PPO

## 2021-11-02 DIAGNOSIS — E8729 Other acidosis: Secondary | ICD-10-CM

## 2021-11-02 DIAGNOSIS — N179 Acute kidney failure, unspecified: Secondary | ICD-10-CM | POA: Diagnosis not present

## 2021-11-02 DIAGNOSIS — I10 Essential (primary) hypertension: Secondary | ICD-10-CM | POA: Diagnosis present

## 2021-11-02 DIAGNOSIS — D649 Anemia, unspecified: Secondary | ICD-10-CM | POA: Diagnosis not present

## 2021-11-02 DIAGNOSIS — J9601 Acute respiratory failure with hypoxia: Secondary | ICD-10-CM | POA: Diagnosis not present

## 2021-11-02 DIAGNOSIS — E1169 Type 2 diabetes mellitus with other specified complication: Secondary | ICD-10-CM

## 2021-11-02 DIAGNOSIS — I4891 Unspecified atrial fibrillation: Secondary | ICD-10-CM | POA: Diagnosis present

## 2021-11-02 DIAGNOSIS — I517 Cardiomegaly: Secondary | ICD-10-CM | POA: Diagnosis not present

## 2021-11-02 DIAGNOSIS — I509 Heart failure, unspecified: Secondary | ICD-10-CM

## 2021-11-02 HISTORY — PX: IR FLUORO GUIDE CV LINE RIGHT: IMG2283

## 2021-11-02 LAB — HEPATITIS B SURFACE ANTIGEN: Hepatitis B Surface Ag: NONREACTIVE

## 2021-11-02 LAB — IRON AND TIBC
Iron: 30 ug/dL — ABNORMAL LOW (ref 45–182)
Saturation Ratios: 11 % — ABNORMAL LOW (ref 17.9–39.5)
TIBC: 276 ug/dL (ref 250–450)
UIBC: 246 ug/dL

## 2021-11-02 LAB — BASIC METABOLIC PANEL
Anion gap: 22 — ABNORMAL HIGH (ref 5–15)
BUN: 145 mg/dL — ABNORMAL HIGH (ref 6–20)
CO2: 14 mmol/L — ABNORMAL LOW (ref 22–32)
Calcium: 8.1 mg/dL — ABNORMAL LOW (ref 8.9–10.3)
Chloride: 101 mmol/L (ref 98–111)
Creatinine, Ser: 15.06 mg/dL — ABNORMAL HIGH (ref 0.61–1.24)
GFR, Estimated: 3 mL/min — ABNORMAL LOW (ref >60)
Glucose, Bld: 106 mg/dL — ABNORMAL HIGH (ref 70–99)
Potassium: 4.7 mmol/L (ref 3.5–5.1)
Sodium: 137 mmol/L (ref 135–145)

## 2021-11-02 LAB — HIV ANTIBODY (ROUTINE TESTING W REFLEX): HIV Screen 4th Generation wRfx: NONREACTIVE

## 2021-11-02 LAB — CBC
HCT: 21.4 % — ABNORMAL LOW (ref 39.0–52.0)
Hemoglobin: 7.5 g/dL — ABNORMAL LOW (ref 13.0–17.0)
MCH: 29.3 pg (ref 26.0–34.0)
MCHC: 35 g/dL (ref 30.0–36.0)
MCV: 83.6 fL (ref 80.0–100.0)
Platelets: 126 10*3/uL — ABNORMAL LOW (ref 150–400)
RBC: 2.56 MIL/uL — ABNORMAL LOW (ref 4.22–5.81)
RDW: 13.2 % (ref 11.5–15.5)
WBC: 8.1 10*3/uL (ref 4.0–10.5)
nRBC: 0 % (ref 0.0–0.2)

## 2021-11-02 LAB — PHOSPHORUS: Phosphorus: 11.1 mg/dL — ABNORMAL HIGH (ref 2.5–4.6)

## 2021-11-02 LAB — ECHOCARDIOGRAM COMPLETE
Area-P 1/2: 4.93 cm2
Height: 65 in
S' Lateral: 4 cm
Weight: 2655.4 oz

## 2021-11-02 LAB — HEMOGLOBIN A1C
Hgb A1c MFr Bld: 4.8 % (ref 4.8–5.6)
Mean Plasma Glucose: 91.06 mg/dL

## 2021-11-02 LAB — URINE CULTURE: Culture: NO GROWTH

## 2021-11-02 LAB — HEPATITIS B SURFACE ANTIBODY,QUALITATIVE: Hep B S Ab: NONREACTIVE

## 2021-11-02 LAB — FERRITIN: Ferritin: 420 ng/mL — ABNORMAL HIGH (ref 24–336)

## 2021-11-02 LAB — TSH: TSH: 1.77 u[IU]/mL (ref 0.350–4.500)

## 2021-11-02 LAB — PREPARE RBC (CROSSMATCH)

## 2021-11-02 MED ORDER — AMLODIPINE BESYLATE 5 MG PO TABS
5.0000 mg | ORAL_TABLET | Freq: Every day | ORAL | Status: DC
  Administered 2021-11-02: 5 mg via ORAL
  Filled 2021-11-02: qty 1

## 2021-11-02 MED ORDER — GELATIN ABSORBABLE 12-7 MM EX MISC
CUTANEOUS | Status: AC
  Filled 2021-11-02: qty 1

## 2021-11-02 MED ORDER — METOPROLOL SUCCINATE ER 50 MG PO TB24
50.0000 mg | ORAL_TABLET | Freq: Every day | ORAL | Status: DC
  Administered 2021-11-03 – 2021-11-08 (×5): 50 mg via ORAL
  Filled 2021-11-02 (×5): qty 1

## 2021-11-02 MED ORDER — PENTAFLUOROPROP-TETRAFLUOROETH EX AERO: 1.0000 "application " | INHALATION_SPRAY | CUTANEOUS | Status: DC | PRN

## 2021-11-02 MED ORDER — DARBEPOETIN ALFA 200 MCG/0.4ML IJ SOSY: 200.0000 ug | PREFILLED_SYRINGE | INTRAMUSCULAR | Status: DC

## 2021-11-02 MED ORDER — NEPRO/CARBSTEADY PO LIQD
237.0000 mL | Freq: Two times a day (BID) | ORAL | Status: DC
  Administered 2021-11-03 – 2021-11-05 (×4): 237 mL via ORAL

## 2021-11-02 MED ORDER — MIDAZOLAM HCL 2 MG/2ML IJ SOLN
INTRAMUSCULAR | Status: AC
  Filled 2021-11-02: qty 2

## 2021-11-02 MED ORDER — CEFAZOLIN SODIUM-DEXTROSE 2-4 GM/100ML-% IV SOLN
INTRAVENOUS | Status: AC | PRN
  Administered 2021-11-02: 2 g via INTRAVENOUS

## 2021-11-02 MED ORDER — LIDOCAINE HCL (PF) 1 % IJ SOLN
5.0000 mL | INTRAMUSCULAR | Status: DC | PRN
  Filled 2021-11-02: qty 5

## 2021-11-02 MED ORDER — PERFLUTREN LIPID MICROSPHERE
1.0000 mL | INTRAVENOUS | Status: AC | PRN
  Administered 2021-11-02: 2 mL via INTRAVENOUS
  Filled 2021-11-02: qty 10

## 2021-11-02 MED ORDER — MIDAZOLAM HCL 2 MG/2ML IJ SOLN
INTRAMUSCULAR | Status: AC | PRN
  Administered 2021-11-02: .5 mg via INTRAVENOUS

## 2021-11-02 MED ORDER — CEFAZOLIN SODIUM-DEXTROSE 2-4 GM/100ML-% IV SOLN
INTRAVENOUS | Status: AC
  Filled 2021-11-02: qty 100

## 2021-11-02 MED ORDER — ALTEPLASE 2 MG IJ SOLR: 2.0000 mg | Freq: Once | INTRAMUSCULAR | Status: DC | PRN

## 2021-11-02 MED ORDER — SODIUM CHLORIDE 0.9 % IV SOLN: 100.0000 mL | INTRAVENOUS | Status: DC | PRN

## 2021-11-02 MED ORDER — FUROSEMIDE 10 MG/ML IJ SOLN
200.0000 mg | Freq: Two times a day (BID) | INTRAVENOUS | Status: DC
  Administered 2021-11-03 – 2021-11-05 (×5): 200 mg via INTRAVENOUS
  Filled 2021-11-02 (×8): qty 20

## 2021-11-02 MED ORDER — LIDOCAINE-PRILOCAINE 2.5-2.5 % EX CREA
1.0000 "application " | TOPICAL_CREAM | CUTANEOUS | Status: DC | PRN
  Filled 2021-11-02: qty 5

## 2021-11-02 MED ORDER — FENTANYL CITRATE (PF) 100 MCG/2ML IJ SOLN
INTRAMUSCULAR | Status: AC
  Filled 2021-11-02: qty 2

## 2021-11-02 MED ORDER — HEPARIN SODIUM (PORCINE) 1000 UNIT/ML DIALYSIS
1000.0000 [IU] | INTRAMUSCULAR | Status: DC | PRN
  Filled 2021-11-02: qty 1

## 2021-11-02 MED ORDER — METOPROLOL TARTRATE 5 MG/5ML IV SOLN
5.0000 mg | Freq: Once | INTRAVENOUS | Status: AC
Start: 2021-11-02 — End: 2021-11-02
  Administered 2021-11-02: 5 mg via INTRAVENOUS
  Filled 2021-11-02: qty 5

## 2021-11-02 MED ORDER — SODIUM CHLORIDE 0.9% IV SOLUTION: Freq: Once | INTRAVENOUS | Status: DC

## 2021-11-02 MED ORDER — HEPARIN SODIUM (PORCINE) 1000 UNIT/ML IJ SOLN
INTRAMUSCULAR | Status: AC
  Filled 2021-11-02: qty 10

## 2021-11-02 MED ORDER — FUROSEMIDE 10 MG/ML IJ SOLN
200.0000 mg | Freq: Once | INTRAVENOUS | Status: AC
  Administered 2021-11-02: 200 mg via INTRAVENOUS
  Filled 2021-11-02: qty 20

## 2021-11-02 MED ORDER — FENTANYL CITRATE (PF) 100 MCG/2ML IJ SOLN
INTRAMUSCULAR | Status: AC | PRN
  Administered 2021-11-02: 25 ug via INTRAVENOUS

## 2021-11-02 MED ORDER — LIDOCAINE HCL 1 % IJ SOLN
INTRAMUSCULAR | Status: AC
  Filled 2021-11-02: qty 20

## 2021-11-02 MED ORDER — ATORVASTATIN CALCIUM 10 MG PO TABS
20.0000 mg | ORAL_TABLET | Freq: Every day | ORAL | Status: DC
  Administered 2021-11-02 – 2021-11-09 (×7): 20 mg via ORAL
  Filled 2021-11-02 (×7): qty 2

## 2021-11-02 NOTE — Progress Notes (Signed)
RT called to assess pt for SOB. O2 increased to 6L [place] prior to RT's arrival, but pt is in obvious distress. Pt has very diminished bbs with fine crackles noted. Pt placed on BIPAP at this time per RT protocol. Pt achieving volumes of around 567mL, and does appear to be more comfortable on BIPAP. Pt states he is starting to breathe easier. RT will continue to monitor.

## 2021-11-02 NOTE — Procedures (Signed)
Interventional Radiology Procedure:   Indications: ESRD and needs hemodialysis  Procedure: Tunneled dialysis catheter placement  Findings: Right jugular Palindrome, 19 cm tip to cuff, tip at SVC/RA junction  Complications: No immediate complications noted.     EBL: Minimal  Plan: Dialysis catheter is ready to use.     Imelda Dandridge R. Anselm Pancoast, MD  Pager: [phone]

## 2021-11-02 NOTE — Progress Notes (Signed)
°  Transition of Care [facility]) Screening Note   Patient Details  Name: Allen Mathis Date of Birth: 03-15-1966   Transition of Care [facility]) CM/SW Contact:    Benard Halsted, LCSW Phone Number: 11/02/2021, 9:38 AM    Transition of Care Department [facility]) has reviewed patient and no TOC needs have been identified at this time. We will continue to monitor patient advancement through interdisciplinary progression rounds. If new patient transition needs arise, please place a TOC consult.

## 2021-11-02 NOTE — ED Notes (Signed)
Report given to [facility]. 

## 2021-11-02 NOTE — Progress Notes (Signed)
Subjective:  Appreciate IR-  should be getting temp HD cath placed soon followed by HD- still dyspneic but is making some urine with high dose lasix and actually feels better -  ultrasound did not show obstruction-  u/a with moderate hgb but no cells   Objective Vital signs in last 24 hours: Vitals:   11/02/21 0727 11/02/21 0751 11/02/21 0800 11/02/21 0930  BP: (!) 168/89 (!) 168/89 (!) 150/82   Pulse: 89 86 82 92  Resp: 20 (!) 21 13 (!) 22  Temp:   97.7 F (36.5 C)   TempSrc:   Axillary   SpO2: 97% 99% 100% 94%  Weight:      Height:       Weight change:   Intake/Output Summary (Last 24 hours) at 11/02/2021 1149 Last data filed at 11/02/2021 1049 Gross per 24 hour  Intake 50 ml  Output 900 ml  Net -850 ml    Assessment/ Plan: Pt is a 56 y.o. yo male who was admitted on 11/01/2021 with uremia and volume overload in the setting of worsening renal failure  Assessment/Plan: 1. Renal-  crt of 4   6 mos ago-  small echogenic kidneys on u/s-  we all suspect that this is just progression of ckd and now ESRD but could have acute component as well-  support with HD today and second treatment for tomorrow  and then monitor-  if is looking like esrd will put plans in place for permanent access and OP placement  2. HTN/vol-  overloaded-  some UOP with big dose diuretics to keep him out of trouble and also utilizing HD.  Continue diuretics for now -  anticipate BP will improve as volume improves  3. Anemia-   most likely due to CKD-  will check iron stores and give esa-  4. Secondary hyperparathyroidism- PTH pending-  will see what phos does with HD for now    [place]: Basic Metabolic Panel: Recent Labs  Lab 11/01/21 1307 11/02/21 0652  NA 136 137  K 4.4 4.7  CL 98 101  CO2 18* 14*  GLUCOSE 113* 106*  BUN 140* 145*  CREATININE 14.18* 15.06*  CALCIUM 7.7* 8.1*  PHOS  --  11.1*   Liver Function Tests: Recent Labs  Lab 11/01/21 1307  AST 13*  ALT 21   ALKPHOS 67  BILITOT 0.7  PROT 6.8  ALBUMIN 3.9   No results for input(s): LIPASE, AMYLASE in the last 168 hours. No results for input(s): AMMONIA in the last 168 hours. CBC: Recent Labs  Lab 11/01/21 1307 11/02/21 0652  WBC 7.6 8.1  NEUTROABS 6.3  --   HGB 7.3* 7.5*  HCT 20.8* 21.4*  MCV 85.2 83.6  PLT 127* 126*   Cardiac Enzymes: No results for input(s): CKTOTAL, CKMB, CKMBINDEX, TROPONINI in the last 168 hours. CBG: No results for input(s): GLUCAP in the last 168 hours.  Iron Studies: No results for input(s): IRON, TIBC, TRANSFERRIN, FERRITIN in the last 72 hours. Studies/Results: US RENAL  Result Date: 11/01/2021 CLINICAL DATA:  Acute renal insufficiency. EXAM: RENAL / URINARY TRACT ULTRASOUND COMPLETE COMPARISON:  Ultrasound dated 02/23/2021. FINDINGS: Right Kidney: Renal measurements: 9.5 x 4.3 x 8.2 cm = volume: 89 mL. The right kidney is atrophic and echogenic. No hydronephrosis or shadowing stone. Left Kidney: Renal measurements: 9.8 x 3.4 x 4.3 cm = volume: 75 mL. The left kidney is atrophic and echogenic. No hydronephrosis or shadowing stone. There is a  2.5 cm inferior pole cyst. Bladder: Appears normal for degree of bladder distention. Other: None. IMPRESSION: Echogenic and atrophic kidneys in keeping with chronic kidney disease. No hydronephrosis or shadowing stone. Electronically Signed   By: Anner Crete M.D.   On: 11/01/2021 19:46   DG CHEST PORT 1 VIEW  Result Date: 11/01/2021 CLINICAL DATA:  Proteinuria, dyspnea, diabetes mellitus, smoker EXAM: PORTABLE CHEST 1 VIEW COMPARISON:  Portable exam 1951 hours without priors for comparison FINDINGS: Enlargement of cardiac silhouette. Mediastinal contours normal. Patchy BILATERAL pulmonary infiltrates with bibasilar pleural effusions and atelectasis. No pneumothorax or acute osseous findings. IMPRESSION: BILATERAL pulmonary infiltrates with bibasilar pleural effusions atelectasis associated with cardiomegaly. Findings  favor pulmonary edema and CHF though infection is not completely excluded. Electronically Signed   By: Lavonia Dana M.D.   On: 11/01/2021 19:59   Medications: Infusions:  sodium chloride     sodium chloride      Scheduled Medications:  sodium chloride   Intravenous Once   amLODipine  5 mg Oral Daily   atorvastatin  20 mg Oral Daily   Chlorhexidine Gluconate Cloth  6 each Topical Q0600    have reviewed scheduled and prn medications.  Physical Exam: General: more comfortable than has been noted in chart-  family at bedside Heart: RRR Lungs: dec BS at bases Abdomen: soft, non tender Extremities: pitting edema  Dialysis Access: none yet    11/02/2021,11:49 AM  LOS: 1 day

## 2021-11-02 NOTE — Consult Note (Signed)
Reason for Consult: Anemia and heme positive stool Referring Physician: [facility]  [name] HPI: This is a 56 year old male with a PMH of DM, HTN, hyperlipidemia, and renal insufficiency admitted for worsening creatinine, nausea, vomiting, and diarrhea.  He states that his symptoms started 3-4 weeks ago and over the time period he felt weaker.  His symptoms stopped approximately one week ago and he did feel that he had an infection at that time.   Nobody else in his family was ill.  Blood work at his PCP's office was showed that he had a markedly elevated creatinine.  In the ER his creatinine was documented to be at 14.18 with a BUN of 140.  His BNP was also elevated at 933.  With his anemia a hemoccult was obtained and he was noted to be heme positive.  The patient was evaluated by Dr. Collene Mares for a screening colonoscopy on 04/2020.  His HGB at that time was 14.0 g/dL.  A colonoscopy was performed on 06/05/2020 and some adenomas were removed, but he had a poor prep.  The recommendation was for him to reprep and to have the repeat procedure in 3 months, but the patient did not follow up as his insurance was not going to cover the procedure.  He needed to wait one year for a repeat procedure.  History reviewed. No pertinent past medical history.  History reviewed. No pertinent surgical history.  Family History  Problem Relation Age of Onset   Diabetes Mother    Diabetes Father     Social History:  reports that he has quit smoking. His smoking use included cigarettes. His smokeless tobacco use includes snuff. He reports current alcohol use. He reports current drug use. Drug: Marijuana.  Allergies: No Known Allergies  Medications: Scheduled:  sodium chloride   Intravenous Once   amLODipine  5 mg Oral Daily   atorvastatin  20 mg Oral Daily   Chlorhexidine Gluconate Cloth  6 each Topical Q0600   Continuous:  sodium chloride     sodium chloride      Results for orders placed or  performed during the hospital encounter of 11/01/21 (from the past 24 hour(s))  CBC with Differential     Status: Abnormal   Collection Time: 11/01/21  1:07 PM  Result Value Ref Range   WBC 7.6 4.0 - 10.5 K/uL   RBC 2.44 (L) 4.22 - 5.81 MIL/uL   Hemoglobin 7.3 (L) 13.0 - 17.0 g/dL   HCT 20.8 (L) 39.0 - 52.0 %   MCV 85.2 80.0 - 100.0 fL   MCH 29.9 26.0 - 34.0 pg   MCHC 35.1 30.0 - 36.0 g/dL   RDW 13.2 11.5 - 15.5 %   Platelets 127 (L) 150 - 400 K/uL   nRBC 0.0 0.0 - 0.2 %   Neutrophils Relative % 84 %   Neutro Abs 6.3 1.7 - 7.7 K/uL   Lymphocytes Relative 9 %   Lymphs Abs 0.7 0.7 - 4.0 K/uL   Monocytes Relative 6 %   Monocytes Absolute 0.5 0.1 - 1.0 K/uL   Eosinophils Relative 1 %   Eosinophils Absolute 0.1 0.0 - 0.5 K/uL   Basophils Relative 0 %   Basophils Absolute 0.0 0.0 - 0.1 K/uL   Immature Granulocytes 0 %   Abs Immature Granulocytes 0.03 0.00 - 0.07 K/uL  Comprehensive metabolic panel     Status: Abnormal   Collection Time: 11/01/21  1:07 PM  Result Value Ref Range  Sodium 136 135 - 145 mmol/L   Potassium 4.4 3.5 - 5.1 mmol/L   Chloride 98 98 - 111 mmol/L   CO2 18 (L) 22 - 32 mmol/L   Glucose, Bld 113 (H) 70 - 99 mg/dL   BUN 140 (H) 6 - 20 mg/dL   Creatinine, Ser 14.18 (H) 0.61 - 1.24 mg/dL   Calcium 7.7 (L) 8.9 - 10.3 mg/dL   Total Protein 6.8 6.5 - 8.1 g/dL   Albumin 3.9 3.5 - 5.0 g/dL   AST 13 (L) 15 - 41 U/L   ALT 21 0 - 44 U/L   Alkaline Phosphatase 67 38 - 126 U/L   Total Bilirubin 0.7 0.3 - 1.2 mg/dL   GFR, Estimated 4 (L) >60 mL/min   Anion gap 20 (H) 5 - 15  Urinalysis, Routine w reflex microscopic Urine, Clean Catch     Status: Abnormal   Collection Time: 11/01/21  1:07 PM  Result Value Ref Range   Color, Urine STRAW (A) YELLOW   APPearance CLEAR CLEAR   Specific Gravity, Urine 1.010 1.005 - 1.030   pH 5.0 5.0 - 8.0   Glucose, UA 150 (A) NEGATIVE mg/dL   Hgb urine dipstick MODERATE (A) NEGATIVE   Bilirubin Urine NEGATIVE NEGATIVE   Ketones, ur  5 (A) NEGATIVE mg/dL   Protein, ur >=300 (A) NEGATIVE mg/dL   Nitrite NEGATIVE NEGATIVE   Leukocytes,Ua NEGATIVE NEGATIVE   RBC / HPF 0-5 0 - 5 RBC/hpf   WBC, UA 0-5 0 - 5 WBC/hpf   Bacteria, UA RARE (A) NONE SEEN  ABO/Rh     Status: None   Collection Time: 11/01/21  1:07 PM  Result Value Ref Range   ABO/RH(D)      A POS Performed at [facility], [place] [address] [place], [place] [address]   Brain natriuretic peptide     Status: Abnormal   Collection Time: 11/01/21  1:07 PM  Result Value Ref Range   B Natriuretic Peptide 933.7 (H) 0.0 - 100.0 pg/mL  Prepare RBC (crossmatch)     Status: None   Collection Time: 11/01/21  4:44 PM  Result Value Ref Range   Order Confirmation      ORDER PROCESSED BY BLOOD BANK Performed at [place] [facility] [address], [place], [place] [address]   POC occult blood, ED RN will collect     Status: Abnormal   Collection Time: 11/01/21  5:18 PM  Result Value Ref Range   Fecal Occult Bld POSITIVE (A) NEGATIVE  Type and screen     Status: None (Preliminary result)   Collection Time: 11/01/21  5:50 PM  Result Value Ref Range   ABO/RH(D) A POS    Antibody Screen NEG    Sample Expiration 11/04/2021,2359    Unit Number [id number]    Blood Component Type RED CELLS,LR    Unit division 00    Status of Unit ALLOCATED    Transfusion Status OK TO TRANSFUSE    Crossmatch Result      Compatible Performed at [facility] [address] [place],  [address]   Resp Panel by RT-PCR (Flu A&B, Covid) Nasopharyngeal Swab     Status: None   Collection Time: 11/01/21  5:55 PM   Specimen: Nasopharyngeal Swab; Nasopharyngeal(NP) swabs in vial transport medium  Result Value Ref Range   SARS Coronavirus 2 by RT PCR NEGATIVE NEGATIVE   Influenza A by PCR NEGATIVE NEGATIVE   Influenza B by  PCR NEGATIVE NEGATIVE  Phosphorus     Status: Abnormal   Collection Time: 11/02/21  6:52 AM  Result Value Ref  Range   Phosphorus 11.1 (H) 2.5 - 4.6 mg/dL  TSH     Status: None   Collection Time: 11/02/21  6:52 AM  Result Value Ref Range   TSH 1.770 0.350 - 4.500 uIU/mL  Basic metabolic panel     Status: Abnormal   Collection Time: 11/02/21  6:52 AM  Result Value Ref Range   Sodium 137 135 - 145 mmol/L   Potassium 4.7 3.5 - 5.1 mmol/L   Chloride 101 98 - 111 mmol/L   CO2 14 (L) 22 - 32 mmol/L   Glucose, Bld 106 (H) 70 - 99 mg/dL   BUN 145 (H) 6 - 20 mg/dL   Creatinine, Ser 15.06 (H) 0.61 - 1.24 mg/dL   Calcium 8.1 (L) 8.9 - 10.3 mg/dL   GFR, Estimated 3 (L) >60 mL/min   Anion gap 22 (H) 5 - 15  CBC     Status: Abnormal   Collection Time: 11/02/21  6:52 AM  Result Value Ref Range   WBC 8.1 4.0 - 10.5 K/uL   RBC 2.56 (L) 4.22 - 5.81 MIL/uL   Hemoglobin 7.5 (L) 13.0 - 17.0 g/dL   HCT 21.4 (L) 39.0 - 52.0 %   MCV 83.6 80.0 - 100.0 fL   MCH 29.3 26.0 - 34.0 pg   MCHC 35.0 30.0 - 36.0 g/dL   RDW 13.2 11.5 - 15.5 %   Platelets 126 (L) 150 - 400 K/uL   nRBC 0.0 0.0 - 0.2 %  Hemoglobin A1c     Status: None   Collection Time: 11/02/21  6:52 AM  Result Value Ref Range   Hgb A1c MFr Bld 4.8 4.8 - 5.6 %   Mean Plasma Glucose 91.06 mg/dL  Type and screen [place]     Status: None (Preliminary result)   Collection Time: 11/02/21  6:55 AM  Result Value Ref Range   ABO/RH(D) A POS    Antibody Screen NEG    Sample Expiration      11/05/2021,2359 Performed at [facility], [address], [place], [place] [address]    Unit Number [id number]    Blood Component Type RED CELLS,LR    Unit division 00    Status of Unit ALLOCATED    Transfusion Status OK TO TRANSFUSE    Crossmatch Result Compatible   Prepare RBC (crossmatch)     Status: None   Collection Time: 11/02/21  7:56 AM  Result Value Ref Range   Order Confirmation      ORDER PROCESSED BY BLOOD BANK Performed at [facility], [place] [address], [place], [place] [address]      US RENAL  Result Date:  11/01/2021 CLINICAL DATA:  Acute renal insufficiency. EXAM: RENAL / URINARY TRACT ULTRASOUND COMPLETE COMPARISON:  Ultrasound dated 02/23/2021. FINDINGS: Right Kidney: Renal measurements: 9.5 x 4.3 x 8.2 cm = volume: 89 mL. The right kidney is atrophic and echogenic. No hydronephrosis or shadowing stone. Left Kidney: Renal measurements: 9.8 x 3.4 x 4.3 cm = volume: 75 mL. The left kidney is atrophic and echogenic. No hydronephrosis or shadowing stone. There is a 2.5 cm inferior pole cyst. Bladder: Appears normal for degree of bladder distention. Other: None. IMPRESSION: Echogenic and atrophic kidneys in keeping with chronic kidney disease. No hydronephrosis or shadowing stone. Electronically Signed   By: Anner Crete  M.D.   On: 11/01/2021 19:46   DG CHEST PORT 1 VIEW  Result Date: 11/01/2021 CLINICAL DATA:  Proteinuria, dyspnea, diabetes mellitus, smoker EXAM: PORTABLE CHEST 1 VIEW COMPARISON:  Portable exam 1951 hours without priors for comparison FINDINGS: Enlargement of cardiac silhouette. Mediastinal contours normal. Patchy BILATERAL pulmonary infiltrates with bibasilar pleural effusions and atelectasis. No pneumothorax or acute osseous findings. IMPRESSION: BILATERAL pulmonary infiltrates with bibasilar pleural effusions atelectasis associated with cardiomegaly. Findings favor pulmonary edema and CHF though infection is not completely excluded. Electronically Signed   By: Lavonia Dana M.D.   On: 11/01/2021 19:59    ROS:  As stated above in the HPI otherwise negative.  Blood pressure (!) 150/82, pulse 92, temperature 97.7 F (36.5 C), temperature source Axillary, resp. rate (!) 22, height 5\' 5"  (1.651 m), weight 75.3 kg, SpO2 94 %.    PE: Gen: NAD, Alert and Oriented HEENT:  [place]/AT, EOMI Neck: Supple, no LAD Lungs: Crackles CV: RRR without M/G/R ABD: Soft, NTND, +BS Ext: No C/C/E  Assessment/Plan: 1) Heme positive stool. 2) Anemia. 3) History of tubular adenomas. 3) Renal  failure.   The patient will require an EGD/colonoscopy during this hospitalization, however, his clinical status needs to be stabilized.  His current oxygen saturation is at 92-94% on 4 liters of oxygen and he does have some dyspnea with speaking.  He is on the schedule today to receive his HD catheter with  IR.  Plan: 1) Dialysis per Renal. 2) Plan for EGD/colonoscopy this coming Monday or Tuesday.  Dr. Tarri Glenn will round for [facility] Soules this weekend.  Dashiell Franchino D 11/02/2021, 11:07 AM

## 2021-11-02 NOTE — Progress Notes (Signed)
RT NOTES: Pt removed from bipap and placed on 5lpm nasal cannula per MD. Pt tolerating well. Will continue to monitor.

## 2021-11-02 NOTE — ED Notes (Signed)
[facility] here to transport pt 

## 2021-11-02 NOTE — Progress Notes (Signed)
Echocardiogram 2D Echocardiogram has been performed.  Oneal Deputy Shakeerah Gradel RDCS 11/02/2021, 12:32 PM

## 2021-11-02 NOTE — Progress Notes (Signed)
PROGRESS NOTE    Allen Mathis  ZOX:[id number] DOB: January 13, 1966 DOA: 11/01/2021 PCP: Allen Bishop, DO    Chief Complaint  Patient presents with   Abnormal Lab    Brief Narrative:    Allen Mathis is a 56 y.o. male with medical history significant for HTN, HLD, Type 2 DM, CKD stage 4 who presents with nausea, vomiting, diarrhea, in ED his work-up significant for ascending creatinine at 14, anemia with hemoglobin of 7.3, with Hemoccult positive stool, significantly dyspneic, hypoxic, requiring 6 L nasal cannula initially, he did require to go on BiPAP overnight as well.   Assessment & Plan:   Principal Problem:   Acute renal failure (ARF) (HCC) Active Problems:   Acute respiratory failure with hypoxia (HCC)   CHF exacerbation (HCC)   Atrial fibrillation with RVR (HCC)   Anemia   High anion gap metabolic acidosis   Type 2 diabetes mellitus with hyperlipidemia (HCC)   HTN (hypertension)     Acute renal failure w/hx of CKD stage IV -Creatinine of 14 from a prior of 3.78 back in June -Management per renal, patient will need to start hemodialysis, especially in the setting of volume overload and poor response to Lasix and respiratory distress. -Plan for temporary HD catheter today, as unstable yet for tunneled HD catheter, to be placed by IR. -Patient to start hemodialysis today after catheter is inserted.  Acute hypoxic respiratory failure -To volume overload in the setting of renal failure, required BiPAP overnight, received significant dose of Lasix 160 mg IV yesterday evening, and 200 mg of IV Lasix this morning,, 600 mils unit output this a.m.    Acute combined systolic/diastolic CHF -CXR obtained showing cardiomegaly with bilateral pleural effusion. BNP of 933 -obtain echocardiogram -IV Lasix 160mg  per nephrology. HD tomorrow.  -Strict intake and output, monitor daily weights   New onset atrial fibrillation with RVR -induced by new CHF, fluid overload -give  one time dose of IV metoprolol and continue to monitor on telemetry -CHA2DS2-VASc score of 3 but not good candidate to start anticoagulation with symptomatic anemia and suspected GI bleed.    Symptomatic anemia -Hemoglobin of 7.3 down from 12.7 since June 2022 -Patient will receive 1 unit PRBC today during HD, will avoid earlier acute to significant volume overload -suspect anemia of CKD and also possible GI source with positive FOBT -Input appreciated, plan for EGD/colonoscopy next week.   Anion gap Metabolic acidosis - secondary to Acute renal failure. Monitor after dialysis.    Type 2 diabetes - Obtain hemoglobin A1c - Hold on any insulin for now due to worsening renal function   Hypertension elevated. Received IV Lasix overnight. Continue amlodipine. Follow with HD today   Hyperlipidemia Continue statin   DVT prophylaxis: SCD  Code Status: Full Family Communication: Cussed with son and sister at bedside Disposition:   Status is: Inpatient  Remains inpatient appropriate because: Will need dialysis, volume overloaded, and respiratory distress       Consultants:  IR  renal GI   Subjective:  Reports dyspnea, he denies any chest pain, fever or chills  Objective: Vitals:   11/02/21 0751 11/02/21 0800 11/02/21 0930 11/02/21 1214  BP: (!) 168/89 (!) 150/82  (!) 166/90  Pulse: 86 82 92 93  Resp: (!) 21 13 (!) 22 20  Temp:  97.7 F (36.5 C)  98.3 F (36.8 C)  TempSrc:  Axillary  Oral  SpO2: 99% 100% 94% 94%  Weight:      Height:  Intake/Output Summary (Last 24 hours) at 11/02/2021 1416 Last data filed at 11/02/2021 1235 Gross per 24 hour  Intake 120 ml  Output 1000 ml  Net -880 ml   Filed Weights   11/01/21 1258 11/02/21 0545  Weight: 79.4 kg 75.3 kg    Examination:   Awake Alert, Oriented X 3, No new F.N deficits, in mild distress due to dyspnea Symmetrical Chest wall movement, diminished air entry at the bases with some  crackles Irregular,No Gallops,Rubs or new Murmurs, No Parasternal Heave, has + JVD  +ve B.Sounds, Abd Soft, No tenderness, No rebound - guarding or rigidity. No Cyanosis, Clubbing ,+1 edema, No new Rash or bruise      Data Reviewed: I have personally reviewed following labs and imaging studies  CBC: Recent Labs  Lab 11/01/21 1307 11/02/21 0652  WBC 7.6 8.1  NEUTROABS 6.3  --   HGB 7.3* 7.5*  HCT 20.8* 21.4*  MCV 85.2 83.6  PLT 127* 126*    Basic Metabolic Panel: Recent Labs  Lab 11/01/21 1307 11/02/21 0652  NA 136 137  K 4.4 4.7  CL 98 101  CO2 18* 14*  GLUCOSE 113* 106*  BUN 140* 145*  CREATININE 14.18* 15.06*  CALCIUM 7.7* 8.1*  PHOS  --  11.1*    GFR: Estimated Creatinine Clearance: 5.1 mL/min (A) (by C-G formula based on SCr of 15.06 mg/dL (H)).  Liver Function Tests: Recent Labs  Lab 11/01/21 1307  AST 13*  ALT 21  ALKPHOS 67  BILITOT 0.7  PROT 6.8  ALBUMIN 3.9    CBG: No results for input(s): GLUCAP in the last 168 hours.   Recent Results (from the past 240 hour(s))  Urine Culture     Status: None   Collection Time: 11/01/21  1:08 PM   Specimen: Urine, Clean Catch  Result Value Ref Range Status   Specimen Description   Final    URINE, CLEAN CATCH Performed at [facility], [place] [address] [place], [place] [address]    Special Requests   Final    NONE Performed at [facility], [place] [address], [place], [place] [address]    Culture   Final    NO GROWTH Performed at [facility], [place] [address], [place], [place] [address]    Report Status 11/02/2021 FINAL  Final  Resp Panel by RT-PCR (Flu A&B, Covid) Nasopharyngeal Swab     Status: None   Collection Time: 11/01/21  5:55 PM   Specimen: Nasopharyngeal Swab; Nasopharyngeal(NP) swabs in vial transport medium  Result Value Ref Range Status   SARS Coronavirus 2 by RT PCR NEGATIVE NEGATIVE Final    Comment: (NOTE) SARS-CoV-2 target nucleic acids  are NOT DETECTED.  The SARS-CoV-2 RNA is generally detectable in upper respiratory specimens during the acute phase of infection. The lowest concentration of SARS-CoV-2 viral copies this assay can detect is 138 copies/mL. A negative result does not preclude SARS-Cov-2 infection and should not be used as the sole basis for treatment or other patient management decisions. A negative result may occur with  improper specimen collection/handling, submission of specimen other than nasopharyngeal swab, presence of viral mutation(s) within the areas targeted by this assay, and inadequate number of viral copies(<138 copies/mL). A negative result must be combined with clinical observations, patient history, and epidemiological information. The expected result is Negative.  Fact Sheet for Patients:  EntrepreneurPulse.com.au  Fact Sheet for Healthcare Providers:  IncredibleEmployment.be  This test is no t yet  approved or cleared by the [place] and  has been authorized for detection and/or diagnosis of SARS-CoV-2 by FDA under an Emergency Use Authorization (EUA). This EUA will remain  in effect (meaning this test can be used) for the duration of the COVID-19 declaration under Section 564(b)(1) of the Act, 21 U.S.C.section 360bbb-3(b)(1), unless the authorization is terminated  or revoked sooner.       Influenza A by PCR NEGATIVE NEGATIVE Final   Influenza B by PCR NEGATIVE NEGATIVE Final    Comment: (NOTE) The Xpert Xpress SARS-CoV-2/FLU/RSV plus assay is intended as an aid in the diagnosis of influenza from Nasopharyngeal swab specimens and should not be used as a sole basis for treatment. Nasal washings and aspirates are unacceptable for Xpert Xpress SARS-CoV-2/FLU/RSV testing.  Fact Sheet for Patients: EntrepreneurPulse.com.au  Fact Sheet for Healthcare Providers: IncredibleEmployment.be  This test is  not yet approved or cleared by the [place] FDA and has been authorized for detection and/or diagnosis of SARS-CoV-2 by FDA under an Emergency Use Authorization (EUA). This EUA will remain in effect (meaning this test can be used) for the duration of the COVID-19 declaration under Section 564(b)(1) of the Act, 21 U.S.C. section 360bbb-3(b)(1), unless the authorization is terminated or revoked.  Performed at [facility], [place] [address], [place], [place] [address]          Radiology Studies: US RENAL  Result Date: 11/01/2021 CLINICAL DATA:  Acute renal insufficiency. EXAM: RENAL / URINARY TRACT ULTRASOUND COMPLETE COMPARISON:  Ultrasound dated 02/23/2021. FINDINGS: Right Kidney: Renal measurements: 9.5 x 4.3 x 8.2 cm = volume: 89 mL. The right kidney is atrophic and echogenic. No hydronephrosis or shadowing stone. Left Kidney: Renal measurements: 9.8 x 3.4 x 4.3 cm = volume: 75 mL. The left kidney is atrophic and echogenic. No hydronephrosis or shadowing stone. There is a 2.5 cm inferior pole cyst. Bladder: Appears normal for degree of bladder distention. Other: None. IMPRESSION: Echogenic and atrophic kidneys in keeping with chronic kidney disease. No hydronephrosis or shadowing stone. Electronically Signed   By: Anner Crete M.D.   On: 11/01/2021 19:46   DG CHEST PORT 1 VIEW  Result Date: 11/01/2021 CLINICAL DATA:  Proteinuria, dyspnea, diabetes mellitus, smoker EXAM: PORTABLE CHEST 1 VIEW COMPARISON:  Portable exam 1951 hours without priors for comparison FINDINGS: Enlargement of cardiac silhouette. Mediastinal contours normal. Patchy BILATERAL pulmonary infiltrates with bibasilar pleural effusions and atelectasis. No pneumothorax or acute osseous findings. IMPRESSION: BILATERAL pulmonary infiltrates with bibasilar pleural effusions atelectasis associated with cardiomegaly. Findings favor pulmonary edema and CHF though infection is not completely excluded.  Electronically Signed   By: Lavonia Dana M.D.   On: 11/01/2021 19:59   ECHOCARDIOGRAM COMPLETE  Result Date: 11/02/2021    ECHOCARDIOGRAM REPORT   Patient Name:   Allen Mathis Date of Exam: 11/02/2021 Medical Rec #:  [id number]        Height:       65.0 in Accession #:    [id number]       Weight:       166.0 lb Date of Birth:  17-Jan-1966        BSA:          1.827 m Patient Age:    64 years         BP:           150/82 mmHg Patient Gender: M  HR:           92 bpm. Exam Location:  Inpatient Procedure: 2D Echo, Color Doppler, Cardiac Doppler and Intracardiac            Opacification Agent Indications:    I51.7 Cardiomegaly  History:        Patient has no prior history of Echocardiogram examinations.                 Arrythmias:Atrial Fibrillation; Risk Factors:Hypertension and                 Diabetes.  Sonographer:    Raquel Sarna Senior RDCS Referring Phys: [id number] [place]  1. Left ventricular ejection fraction, by estimation, is 45 to 50%. The left ventricle has mildly decreased function. The left ventricle has no regional wall motion abnormalities. Left ventricular diastolic parameters are indeterminate. Elevated left ventricular end-diastolic pressure.  2. Right ventricular systolic function is normal. The right ventricular size is normal.  3. The mitral valve is normal in structure. Mild mitral valve regurgitation. No evidence of mitral stenosis.  4. The aortic valve is normal in structure. Aortic valve regurgitation is not visualized. No aortic stenosis is present.  5. The inferior vena cava is normal in size with greater than 50% respiratory variability, suggesting right atrial pressure of 3 mmHg. FINDINGS  Left Ventricle: Left ventricular ejection fraction, by estimation, is 45 to 50%. The left ventricle has mildly decreased function. The left ventricle has no regional wall motion abnormalities. Definity contrast agent was given IV to delineate the left ventricular endocardial  borders. The left ventricular internal cavity size was normal in size. There is no left ventricular hypertrophy. Left ventricular diastolic parameters are indeterminate. Elevated left ventricular end-diastolic pressure. Right Ventricle: The right ventricular size is normal. No increase in right ventricular wall thickness. Right ventricular systolic function is normal. Left Atrium: Left atrial size was normal in size. Right Atrium: Right atrial size was normal in size. Pericardium: There is no evidence of pericardial effusion. Mitral Valve: The mitral valve is normal in structure. Mild mitral valve regurgitation. No evidence of mitral valve stenosis. Tricuspid Valve: The tricuspid valve is normal in structure. Tricuspid valve regurgitation is not demonstrated. No evidence of tricuspid stenosis. Aortic Valve: The aortic valve is normal in structure. Aortic valve regurgitation is not visualized. No aortic stenosis is present. Pulmonic Valve: The pulmonic valve was normal in structure. Pulmonic valve regurgitation is not visualized. No evidence of pulmonic stenosis. Aorta: The aortic root is normal in size and structure. Venous: The inferior vena cava is normal in size with greater than 50% respiratory variability, suggesting right atrial pressure of 3 mmHg. IAS/Shunts: No atrial level shunt detected by color flow Doppler.  LEFT VENTRICLE PLAX 2D LVIDd:         5.25 cm   Diastology LVIDs:         4.00 cm   LV e' medial:    4.03 cm/s LV PW:         1.00 cm   LV E/e' medial:  28.5 LV IVS:        0.95 cm   LV e' lateral:   8.38 cm/s LVOT diam:     2.20 cm   LV E/e' lateral: 13.7 LV SV:         73 LV SV Index:   40 LVOT Area:     3.80 cm  RIGHT VENTRICLE RV S prime:     15.20 cm/s TAPSE (M-mode):  2.6 cm LEFT ATRIUM             Index        RIGHT ATRIUM           Index LA diam:        4.60 cm 2.52 cm/m   RA Area:     18.00 cm LA Vol (A2C):   72.7 ml 39.78 ml/m  RA Volume:   49.60 ml  27.14 ml/m LA Vol (A4C):   51.3 ml  28.07 ml/m LA Biplane Vol: 62.8 ml 34.37 ml/m  AORTIC VALVE LVOT Vmax:   97.40 cm/s LVOT Vmean:  73.700 cm/s LVOT VTI:    0.193 m  AORTA Ao Root diam: 3.00 cm MITRAL VALVE MV Area (PHT): 4.93 cm     SHUNTS MV Decel Time: 154 msec     Systemic VTI:  0.19 m MV E velocity: 115.00 cm/s  Systemic Diam: 2.20 cm MV A velocity: 106.00 cm/s MV E/A ratio:  1.08 Fransico Him MD Electronically signed by Fransico Him MD Signature Date/Time: 11/02/2021/12:45:26 PM    Final         Scheduled Meds:  sodium chloride   Intravenous Once   amLODipine  5 mg Oral Daily   atorvastatin  20 mg Oral Daily   Chlorhexidine Gluconate Cloth  6 each Topical Q0600   [START ON 11/08/2021] darbepoetin (ARANESP) injection - DIALYSIS  200 mcg Intravenous Q Thu-HD   Continuous Infusions:  sodium chloride     sodium chloride     furosemide       LOS: 1 day       Phillips Climes, MD [facility]   To contact the attending provider between 7A-7P or the covering provider during after hours 7P-7A, please log into the web site [email or web address] and access using universal [place] password for that web site. If you do not have the password, please call the hospital operator.  11/02/2021, 2:16 PM   Patient ID: KAITLYN SKOWRON, male   DOB: 11-22-1965, 56 y.o.   MRN: [id number]

## 2021-11-02 NOTE — H&P (Addendum)
Chief Complaint: Patient was seen in consultation today for temporary/tunneled dialysis catheter at the request of Santiago Bumpers, MD  Referring Physician(s): Santiago Bumpers, MD  Supervising Physician: Juliet Rude  Patient Status: [facility] - In-pt  History of Present Illness: Allen Mathis is a 56 y.o. male w/ PMH of DM2, HTN, CKD and HLD admitted for ARF. Dr. Santiago Bumpers is requesting catheter for hemodialysis. Pt has experienced respiratory distress with BIPAP today. At the time of consult, BIPAP was removed and pt on O2 via [place]. Lasix drip has improved respiratory status. Due to respiratory status, pt and family wish to have temporary catheter placed and tunneled placed at a later date when pt is more stable.    Allergies: Patient has no known allergies.  Medications: Prior to Admission medications   Medication Sig Start Date End Date Taking? Authorizing Provider  amLODipine (NORVASC) 5 MG tablet Take 1 tablet by mouth daily. 09/05/21  Yes [provider]  aspirin 81 MG EC tablet Take by mouth.   Yes [provider]  atorvastatin (LIPITOR) 20 MG tablet Take 20 mg by mouth daily. 10/30/21  Yes [provider]  omega-3 acid ethyl esters (LOVAZA) 1 g capsule Take 1 g by mouth daily.   Yes [provider]  TRULICITY 9.51 OA/4.1YS SOPN Inject 0.75 mg into the skin once a week. 09/14/21  Yes [provider]  vitamin B-12 (CYANOCOBALAMIN) 100 MCG tablet Take 100 mcg by mouth daily.   Yes [provider]     Family History  Problem Relation Age of Onset   Diabetes Mother    Diabetes Father     Social History   Socioeconomic History   Marital status: Single    Spouse name: Not on file   Number of children: Not on file   Years of education: Not on file   Highest education level: Not on file  Occupational History   Not on file  Tobacco Use   Smoking status: Former    Types: Cigarettes   Smokeless tobacco: Current     Types: Snuff  Vaping Use   Vaping Use: Never used  Substance and Sexual Activity   Alcohol use: Yes   Drug use: Yes    Types: Marijuana   Sexual activity: Not on file  Other Topics Concern   Not on file  Social History Narrative   Not on file   Social Determinants of Health   Financial Resource Strain: Not on file  Food Insecurity: Not on file  Transportation Needs: Not on file  Physical Activity: Not on file  Stress: Not on file  Social Connections: Not on file    Review of Systems: A 12 point ROS discussed and pertinent positives are indicated in the HPI above.  All other systems are negative.  Review of Systems  Constitutional:  Positive for appetite change and chills. Negative for fever.  Respiratory:  Positive for cough and shortness of breath.        Pt reports hemoptysis today  Cardiovascular:  Negative for chest pain and leg swelling.  Gastrointestinal:  Negative for abdominal pain, blood in stool, nausea and vomiting.  Endocrine: Positive for cold intolerance.  Genitourinary:  Negative for hematuria.  Neurological:  Negative for dizziness and headaches.   Vital Signs: BP (!) 150/82 (BP Location: Left Arm)    Pulse 92    Temp 97.7 F (36.5 C) (Axillary)    Resp (!) 22    Ht 5\' 5"  (  1.651 m)    Wt 165 lb 15.4 oz (75.3 kg)    SpO2 94%    BMI 27.62 kg/m   Physical Exam Constitutional:      Appearance: He is ill-appearing.  HENT:     Head: Normocephalic and atraumatic.     Mouth/Throat:     Mouth: Mucous membranes are dry.     Pharynx: Oropharynx is clear.  Cardiovascular:     Rate and Rhythm: Regular rhythm. Tachycardia present.     Pulses: Normal pulses.     Heart sounds: Normal heart sounds. No murmur heard.   No friction rub. No gallop.  Pulmonary:     Effort: Respiratory distress present.     Breath sounds: Normal breath sounds. No stridor. No wheezing, rhonchi or rales.     Comments: Pt unable to speak in complete sentences, [place] in  place Musculoskeletal:     Right lower leg: No edema.     Left lower leg: No edema.  Skin:    General: Skin is warm and dry.  Neurological:     Mental Status: He is alert and oriented to person, place, and time.  Psychiatric:        Mood and Affect: Mood normal.        Behavior: Behavior normal.        Thought Content: Thought content normal.        Judgment: Judgment normal.    Imaging: US RENAL  Result Date: 11/01/2021 CLINICAL DATA:  Acute renal insufficiency. EXAM: RENAL / URINARY TRACT ULTRASOUND COMPLETE COMPARISON:  Ultrasound dated 02/23/2021. FINDINGS: Right Kidney: Renal measurements: 9.5 x 4.3 x 8.2 cm = volume: 89 mL. The right kidney is atrophic and echogenic. No hydronephrosis or shadowing stone. Left Kidney: Renal measurements: 9.8 x 3.4 x 4.3 cm = volume: 75 mL. The left kidney is atrophic and echogenic. No hydronephrosis or shadowing stone. There is a 2.5 cm inferior pole cyst. Bladder: Appears normal for degree of bladder distention. Other: None. IMPRESSION: Echogenic and atrophic kidneys in keeping with chronic kidney disease. No hydronephrosis or shadowing stone. Electronically Signed   By: Anner Crete M.D.   On: 11/01/2021 19:46   DG CHEST PORT 1 VIEW  Result Date: 11/01/2021 CLINICAL DATA:  Proteinuria, dyspnea, diabetes mellitus, smoker EXAM: PORTABLE CHEST 1 VIEW COMPARISON:  Portable exam 1951 hours without priors for comparison FINDINGS: Enlargement of cardiac silhouette. Mediastinal contours normal. Patchy BILATERAL pulmonary infiltrates with bibasilar pleural effusions and atelectasis. No pneumothorax or acute osseous findings. IMPRESSION: BILATERAL pulmonary infiltrates with bibasilar pleural effusions atelectasis associated with cardiomegaly. Findings favor pulmonary edema and CHF though infection is not completely excluded. Electronically Signed   By: Lavonia Dana M.D.   On: 11/01/2021 19:59    Labs:  CBC: Recent Labs    11/01/21 1307 11/02/21 0652   WBC 7.6 8.1  HGB 7.3* 7.5*  HCT 20.8* 21.4*  PLT 127* 126*    COAGS: No results for input(s): INR, APTT in the last 8760 hours.  BMP: Recent Labs    11/01/21 1307 11/02/21 0652  NA 136 137  K 4.4 4.7  CL 98 101  CO2 18* 14*  GLUCOSE 113* 106*  BUN 140* 145*  CALCIUM 7.7* 8.1*  CREATININE 14.18* 15.06*  GFRNONAA 4* 3*    LIVER FUNCTION TESTS: Recent Labs    11/01/21 1307  BILITOT 0.7  AST 13*  ALT 21  ALKPHOS 67  PROT 6.8  ALBUMIN 3.9    TUMOR MARKERS: No  results for input(s): AFPTM, CEA, CA199, CHROMGRNA in the last 8760 hours.  Assessment and Plan: History of DM2, HTN, CKD and HLD admitted for ARF. Dr. Santiago Bumpers is requesting catheter for hemodialysis. Pt has experienced respiratory distress with BIPAP today. At the time of consult, BIPAP was removed and pt on O2 via [place]. Lasix drip has improved respiratory status. Due to respiratory status, pt and family wish to have temporary catheter placed and tunneled placed at a later date when pt is more stable.    Pt sitting on edge of bed. He appears to be in mild, respiratory distress with O2 via [place].  Pt family at bedside.  Pt states he is NPO.   Risks and benefits discussed with the patient including, but not limited to bleeding, infection, vascular injury, pneumothorax which may require chest tube placement, air embolism or even death  All of the patient's questions were answered, patient is agreeable to proceed. Consent signed and in chart.   Thank you for this interesting consult.  I greatly enjoyed meeting Allen Mathis and look forward to participating in their care.  A copy of this report was sent to the requesting provider on this date.  Electronically Signed: Tyson Alias, NP 11/02/2021, 10:21 AM   I spent a total of 20 minutes in face to face in clinical consultation, greater than 50% of which was counseling/coordinating care for temporary dialysis catheter.

## 2021-11-02 NOTE — Progress Notes (Signed)
Notified by RN that pt had elevated HR in 130-140 range. In A-fib which was confirmed on EKG.  Given metoprolol 5 mg IV. HR improved to 90-98 and converted to NSR.  Pt had been given metoprolol in ER earlier in day when admitted and converted to sinus rhythm then too.   Add metoprolol XL 50 mg po q am starting on 11/03/21

## 2021-11-03 DIAGNOSIS — D500 Iron deficiency anemia secondary to blood loss (chronic): Secondary | ICD-10-CM | POA: Diagnosis not present

## 2021-11-03 DIAGNOSIS — N179 Acute kidney failure, unspecified: Secondary | ICD-10-CM | POA: Diagnosis not present

## 2021-11-03 DIAGNOSIS — I10 Essential (primary) hypertension: Secondary | ICD-10-CM | POA: Diagnosis not present

## 2021-11-03 LAB — RENAL FUNCTION PANEL
Albumin: 3.1 g/dL — ABNORMAL LOW (ref 3.5–5.0)
Anion gap: 15 (ref 5–15)
BUN: 91 mg/dL — ABNORMAL HIGH (ref 6–20)
CO2: 23 mmol/L (ref 22–32)
Calcium: 8.1 mg/dL — ABNORMAL LOW (ref 8.9–10.3)
Chloride: 101 mmol/L (ref 98–111)
Creatinine, Ser: 10.12 mg/dL — ABNORMAL HIGH (ref 0.61–1.24)
GFR, Estimated: 5 mL/min — ABNORMAL LOW (ref >60)
Glucose, Bld: 99 mg/dL (ref 70–99)
Phosphorus: 7.6 mg/dL — ABNORMAL HIGH (ref 2.5–4.6)
Potassium: 3.9 mmol/L (ref 3.5–5.1)
Sodium: 139 mmol/L (ref 135–145)

## 2021-11-03 LAB — CBC
HCT: 22.5 % — ABNORMAL LOW (ref 39.0–52.0)
Hemoglobin: 8 g/dL — ABNORMAL LOW (ref 13.0–17.0)
MCH: 29.4 pg (ref 26.0–34.0)
MCHC: 35.6 g/dL (ref 30.0–36.0)
MCV: 82.7 fL (ref 80.0–100.0)
Platelets: 113 10*3/uL — ABNORMAL LOW (ref 150–400)
RBC: 2.72 MIL/uL — ABNORMAL LOW (ref 4.22–5.81)
RDW: 12.9 % (ref 11.5–15.5)
WBC: 6.3 10*3/uL (ref 4.0–10.5)
nRBC: 0 % (ref 0.0–0.2)

## 2021-11-03 LAB — HEPATITIS B SURFACE ANTIBODY, QUANTITATIVE: Hep B S AB Quant (Post): <3.1 m[IU]/mL — ABNORMAL LOW (ref >9.9)

## 2021-11-03 LAB — PARATHYROID HORMONE, INTACT (NO CA): PTH: 265 pg/mL — ABNORMAL HIGH (ref 15–65)

## 2021-11-03 MED ORDER — PANTOPRAZOLE SODIUM 40 MG PO TBEC
40.0000 mg | DELAYED_RELEASE_TABLET | Freq: Two times a day (BID) | ORAL | Status: DC
  Administered 2021-11-03 – 2021-11-09 (×10): 40 mg via ORAL
  Filled 2021-11-03 (×10): qty 1

## 2021-11-03 MED ORDER — NA FERRIC GLUC CPLX IN SUCROSE 12.5 MG/ML IV SOLN
250.0000 mg | Freq: Every day | INTRAVENOUS | Status: AC
  Administered 2021-11-03 – 2021-11-06 (×4): 250 mg via INTRAVENOUS
  Filled 2021-11-03 (×5): qty 20

## 2021-11-03 MED ORDER — CHLORHEXIDINE GLUCONATE CLOTH 2 % EX PADS
6.0000 | MEDICATED_PAD | Freq: Every day | CUTANEOUS | Status: DC
  Administered 2021-11-04 – 2021-11-09 (×6): 6 via TOPICAL

## 2021-11-03 MED ORDER — METOPROLOL TARTRATE 5 MG/5ML IV SOLN
5.0000 mg | INTRAVENOUS | Status: DC | PRN
  Filled 2021-11-03: qty 5

## 2021-11-03 MED ORDER — HEPARIN SODIUM (PORCINE) 1000 UNIT/ML IJ SOLN
INTRAMUSCULAR | Status: AC
  Administered 2021-11-03: 3000 [IU]
  Filled 2021-11-03: qty 3

## 2021-11-03 NOTE — Progress Notes (Signed)
Subjective:  Had HD last night after TDC placed-  removed 1 liters also had nearly a liter of UOP-  went into Afib overnight-  converted with lopressor -   labs improved but still abnormal-  planning on HD #2 today -   he looks like a brand new man today-  slept laying flat-  standing up without assistance   Objective Vital signs in last 24 hours: Vitals:   11/03/21 0500 11/03/21 0600 11/03/21 0828 11/03/21 0901  BP:  131/80 (!) 146/81   Pulse: 87 85 83   Resp: 17 15 20    Temp:  97.9 F (36.6 C) 98.5 F (36.9 C)   TempSrc:  Oral Oral   SpO2: 98% 97% 94% 94%  Weight: 72.8 kg     Height: 5\' 5"  (1.651 m)      Weight change: -6.38 kg  Intake/Output Summary (Last 24 hours) at 11/03/2021 0930 Last data filed at 11/03/2021 6283 Gross per 24 hour  Intake 1165 ml  Output 1820 ml  Net -655 ml    Assessment/ Plan: Pt is a 56 y.o. yo male who was admitted on 11/01/2021 with uremia and volume overload in the setting of worsening renal failure  Assessment/Plan: 1. Renal-  crt of 4   6 mos ago-  small echogenic kidneys on u/s-  we all suspect that this is just progression of ckd and now ESRD but could have acute component as well-  support with HD yesterday and second treatment for today  and then monitor-  if is looking like esrd will put plans in place for permanent access and OP placement -   2. HTN/vol-  overloaded-  some UOP with big dose diuretics to keep him out of trouble and also utilizing HD.  Continue high dose diuretics for now -  anticipate BP will improve as volume improves -  is already  3. Anemia-   most likely due to CKD-   iron stores low- will replete and also gave esa-  4. Secondary hyperparathyroidism- PTH pending-  will see what phos does with HD for now    [place]: Basic Metabolic Panel: Recent Labs  Lab 11/01/21 1307 11/02/21 0652 11/03/21 0055  NA 136 137 139  K 4.4 4.7 3.9  CL 98 101 101  CO2 18* 14* 23  GLUCOSE 113* 106* 99  BUN 140*  145* 91*  CREATININE 14.18* 15.06* 10.12*  CALCIUM 7.7* 8.1* 8.1*  PHOS  --  11.1* 7.6*   Liver Function Tests: Recent Labs  Lab 11/01/21 1307 11/03/21 0055  AST 13*  --   ALT 21  --   ALKPHOS 67  --   BILITOT 0.7  --   PROT 6.8  --   ALBUMIN 3.9 3.1*   No results for input(s): LIPASE, AMYLASE in the last 168 hours. No results for input(s): AMMONIA in the last 168 hours. CBC: Recent Labs  Lab 11/01/21 1307 11/02/21 0652 11/03/21 0055  WBC 7.6 8.1 6.3  NEUTROABS 6.3  --   --   HGB 7.3* 7.5* 8.0*  HCT 20.8* 21.4* 22.5*  MCV 85.2 83.6 82.7  PLT 127* 126* 113*   Cardiac Enzymes: No results for input(s): CKTOTAL, CKMB, CKMBINDEX, TROPONINI in the last 168 hours. CBG: No results for input(s): GLUCAP in the last 168 hours.  Iron Studies:  Recent Labs    11/02/21 0652  IRON 30*  TIBC 276  FERRITIN 420*   Studies/Results: US RENAL  Result Date: 11/01/2021 CLINICAL DATA:  Acute renal insufficiency. EXAM: RENAL / URINARY TRACT ULTRASOUND COMPLETE COMPARISON:  Ultrasound dated 02/23/2021. FINDINGS: Right Kidney: Renal measurements: 9.5 x 4.3 x 8.2 cm = volume: 89 mL. The right kidney is atrophic and echogenic. No hydronephrosis or shadowing stone. Left Kidney: Renal measurements: 9.8 x 3.4 x 4.3 cm = volume: 75 mL. The left kidney is atrophic and echogenic. No hydronephrosis or shadowing stone. There is a 2.5 cm inferior pole cyst. Bladder: Appears normal for degree of bladder distention. Other: None. IMPRESSION: Echogenic and atrophic kidneys in keeping with chronic kidney disease. No hydronephrosis or shadowing stone. Electronically Signed   By: Anner Crete M.D.   On: 11/01/2021 19:46   IR Fluoro Guide CV Line Right  Result Date: 11/02/2021 INDICATION: 56 year old with end-stage renal disease. Patient needs a catheter for hemodialysis. EXAM: FLUOROSCOPIC AND ULTRASOUND GUIDED PLACEMENT OF A TUNNELED DIALYSIS CATHETER Physician: Stephan Minister. Anselm Pancoast, MD MEDICATIONS: Ancef 2 g;  The antibiotic was administered within an appropriate time interval prior to skin puncture. ANESTHESIA/SEDATION: Moderate (conscious) sedation was employed during this procedure. A total of Versed 0.5mg  and fentanyl 25 mcg was administered intravenously at the order of the provider performing the procedure. Total intra-service moderate sedation time: 22 minutes. Patient's level of consciousness and vital signs were monitored continuously by radiology nurse throughout the procedure under the supervision of the provider performing the procedure. FLUOROSCOPY TIME:  Fluoroscopy Time: 1 minute, 12 seconds, 8 mGy COMPLICATIONS: None immediate. PROCEDURE: The procedure was explained to the patient. The risks and benefits of the procedure were discussed and the patient's questions were addressed. Informed consent was obtained from the patient. The patient was placed supine on the interventional table. Ultrasound confirmed a patent right internal jugular vein. Ultrasound image obtained for documentation. The right neck and chest was prepped and draped in a sterile fashion. Maximal barrier sterile technique was utilized including caps, mask, sterile gowns, sterile gloves, sterile drape, hand hygiene and skin antiseptic. The right neck was anesthetized with 1% lidocaine. A small incision was made with #11 blade scalpel. A 21 gauge needle directed into the right internal jugular vein with ultrasound guidance. A micropuncture dilator set was placed. A 19 cm tip to cuff Palindrome catheter was selected. The skin below the right clavicle was anesthetized and a small incision was made with an #11 blade scalpel. A subcutaneous tunnel was formed to the vein dermatotomy site. The catheter was brought through the tunnel. The vein dermatotomy site was dilated to accommodate a peel-away sheath. The catheter was placed through the peel-away sheath and directed into the central venous structures. The tip of the catheter was placed at  superior cavoatrial junction with fluoroscopy. Fluoroscopic images were obtained for documentation. Both lumens were found to aspirate and flush well. The proper amount of heparin was flushed in both lumens. The vein dermatotomy site was closed using a single layer of absorbable suture and Dermabond. Gel-Foam was placed in the subcutaneous tract. The catheter was secured to the skin using Prolene suture. IMPRESSION: Successful placement of a right jugular tunneled dialysis catheter using ultrasound and fluoroscopic guidance. Electronically Signed   By: Markus Daft M.D.   On: 11/02/2021 17:08   DG CHEST PORT 1 VIEW  Result Date: 11/01/2021 CLINICAL DATA:  Proteinuria, dyspnea, diabetes mellitus, smoker EXAM: PORTABLE CHEST 1 VIEW COMPARISON:  Portable exam 1951 hours without priors for comparison FINDINGS: Enlargement of cardiac silhouette. Mediastinal contours normal. Patchy BILATERAL pulmonary infiltrates with bibasilar pleural effusions  and atelectasis. No pneumothorax or acute osseous findings. IMPRESSION: BILATERAL pulmonary infiltrates with bibasilar pleural effusions atelectasis associated with cardiomegaly. Findings favor pulmonary edema and CHF though infection is not completely excluded. Electronically Signed   By: Lavonia Dana M.D.   On: 11/01/2021 19:59   ECHOCARDIOGRAM COMPLETE  Result Date: 11/02/2021    ECHOCARDIOGRAM REPORT   Patient Name:   HENRICK MCGUE Date of Exam: 11/02/2021 Medical Rec #:  [id number]        Height:       65.0 in Accession #:    [id number]       Weight:       166.0 lb Date of Birth:  02/14/1966        BSA:          1.827 m Patient Age:    18 years         BP:           150/82 mmHg Patient Gender: M                HR:           92 bpm. Exam Location:  Inpatient Procedure: 2D Echo, Color Doppler, Cardiac Doppler and Intracardiac            Opacification Agent Indications:    I51.7 Cardiomegaly  History:        Patient has no prior history of Echocardiogram examinations.                  Arrythmias:Atrial Fibrillation; Risk Factors:Hypertension and                 Diabetes.  Sonographer:    Raquel Sarna Senior RDCS Referring Phys: [id number] [place]  1. Left ventricular ejection fraction, by estimation, is 45 to 50%. The left ventricle has mildly decreased function. The left ventricle has no regional wall motion abnormalities. Left ventricular diastolic parameters are indeterminate. Elevated left ventricular end-diastolic pressure.  2. Right ventricular systolic function is normal. The right ventricular size is normal.  3. The mitral valve is normal in structure. Mild mitral valve regurgitation. No evidence of mitral stenosis.  4. The aortic valve is normal in structure. Aortic valve regurgitation is not visualized. No aortic stenosis is present.  5. The inferior vena cava is normal in size with greater than 50% respiratory variability, suggesting right atrial pressure of 3 mmHg. FINDINGS  Left Ventricle: Left ventricular ejection fraction, by estimation, is 45 to 50%. The left ventricle has mildly decreased function. The left ventricle has no regional wall motion abnormalities. Definity contrast agent was given IV to delineate the left ventricular endocardial borders. The left ventricular internal cavity size was normal in size. There is no left ventricular hypertrophy. Left ventricular diastolic parameters are indeterminate. Elevated left ventricular end-diastolic pressure. Right Ventricle: The right ventricular size is normal. No increase in right ventricular wall thickness. Right ventricular systolic function is normal. Left Atrium: Left atrial size was normal in size. Right Atrium: Right atrial size was normal in size. Pericardium: There is no evidence of pericardial effusion. Mitral Valve: The mitral valve is normal in structure. Mild mitral valve regurgitation. No evidence of mitral valve stenosis. Tricuspid Valve: The tricuspid valve is normal in structure. Tricuspid valve  regurgitation is not demonstrated. No evidence of tricuspid stenosis. Aortic Valve: The aortic valve is normal in structure. Aortic valve regurgitation is not visualized. No aortic stenosis is present. Pulmonic Valve: The pulmonic valve was normal in structure.  Pulmonic valve regurgitation is not visualized. No evidence of pulmonic stenosis. Aorta: The aortic root is normal in size and structure. Venous: The inferior vena cava is normal in size with greater than 50% respiratory variability, suggesting right atrial pressure of 3 mmHg. IAS/Shunts: No atrial level shunt detected by color flow Doppler.  LEFT VENTRICLE PLAX 2D LVIDd:         5.25 cm   Diastology LVIDs:         4.00 cm   LV e' medial:    4.03 cm/s LV PW:         1.00 cm   LV E/e' medial:  28.5 LV IVS:        0.95 cm   LV e' lateral:   8.38 cm/s LVOT diam:     2.20 cm   LV E/e' lateral: 13.7 LV SV:         73 LV SV Index:   40 LVOT Area:     3.80 cm  RIGHT VENTRICLE RV S prime:     15.20 cm/s TAPSE (M-mode): 2.6 cm LEFT ATRIUM             Index        RIGHT ATRIUM           Index LA diam:        4.60 cm 2.52 cm/m   RA Area:     18.00 cm LA Vol (A2C):   72.7 ml 39.78 ml/m  RA Volume:   49.60 ml  27.14 ml/m LA Vol (A4C):   51.3 ml 28.07 ml/m LA Biplane Vol: 62.8 ml 34.37 ml/m  AORTIC VALVE LVOT Vmax:   97.40 cm/s LVOT Vmean:  73.700 cm/s LVOT VTI:    0.193 m  AORTA Ao Root diam: 3.00 cm MITRAL VALVE MV Area (PHT): 4.93 cm     SHUNTS MV Decel Time: 154 msec     Systemic VTI:  0.19 m MV E velocity: 115.00 cm/s  Systemic Diam: 2.20 cm MV A velocity: 106.00 cm/s MV E/A ratio:  1.08 Fransico Him MD Electronically signed by Fransico Him MD Signature Date/Time: 11/02/2021/12:45:26 PM    Final    Medications: Infusions:  furosemide 200 mg (11/03/21 0055)    Scheduled Medications:  sodium chloride   Intravenous Once   atorvastatin  20 mg Oral Daily   Chlorhexidine Gluconate Cloth  6 each Topical Q0600   [START ON 11/08/2021] darbepoetin (ARANESP)  injection - DIALYSIS  200 mcg Intravenous Q Thu-HD   feeding supplement (NEPRO CARB STEADY)  237 mL Oral BID BM   metoprolol succinate  50 mg Oral Daily   pantoprazole  40 mg Oral BID AC    have reviewed scheduled and prn medications.  Physical Exam: General: much more comfortable-  on room air Heart: RRR Lungs: dec BS at bases Abdomen: soft, non tender Extremities: pitting edema - improved  Dialysis Access: right sided tunneled HD cath    11/03/2021,9:30 AM  LOS: 2 days

## 2021-11-03 NOTE — Progress Notes (Signed)
° ° ° °  [facility] Progress Note  CC:  Anemia  Assessment / Plan: Found to have normocytic anemia with heme + stools during this hospitalization for symptomatic, progressive end-stage renal disease presenting with uremia, volume overload, and acute hypoxic respiratory failure. He started dialysis yesterday. Likely has component of anemia due to chronic kidney disease. Iron levels are low with ferritin of 420, saturation ratio 11. Most consider component of GI blood loss given his history of incomplete colonoscopy in 2021 although there are no localizing symptoms. Polyps were identified on colonoscopy 2021. Repeat exam recommended in 3 months but not performed.   Planing EGD and colonoscopy when stable for the procedure and anesthesia. He is having his second HD treatment day. Will check back Monday for endoscopy next week. Please continue with serial hgb/hct with transfusion as needed in the meantime. Nephrology ordered ferric gluconate and darbepoetin alfa already.   Please call the on-call gastroenterologist with any questions or concerns prior to that time.   Subjective: Started dialysis yesterday. Went into atrial fibrillation overnight. There has been no overt bleeding. No GI complaints today.   Objective:  Vital signs in last 24 hours: Temp:  [97.9 F (36.6 C)-98.8 F (37.1 C)] 98.5 F (36.9 C) (01/28 0828) Pulse Rate:  [47-147] 83 (01/28 0828) Resp:  [9-35] 20 (01/28 0828) BP: (119-177)/(77-131) 146/81 (01/28 0828) SpO2:  [91 %-100 %] 95 % (01/28 1053) FiO2 (%):  [40 %] 40 % (01/28 0300) Weight:  [72 kg-73 kg] 72.8 kg (01/28 0500) Last BM Date: 11/02/21 General:   Alert, in NAD, HD catheter present on the right side Abdomen:  Soft. Nontender. Nondistended. Normal bowel sounds. No rebound or guarding. Extremities:  pitting edema Neurologic:  Alert and  oriented x4;  grossly normal neurologically. Psych:  Alert and cooperative. Normal mood and affect.  Lab  Results: Recent Labs    11/01/21 1307 11/02/21 0652 11/03/21 0055  WBC 7.6 8.1 6.3  HGB 7.3* 7.5* 8.0*  HCT 20.8* 21.4* 22.5*  PLT 127* 126* 113*   BMET Recent Labs    11/01/21 1307 11/02/21 0652 11/03/21 0055  NA 136 137 139  K 4.4 4.7 3.9  CL 98 101 101  CO2 18* 14* 23  GLUCOSE 113* 106* 99  BUN 140* 145* 91*  CREATININE 14.18* 15.06* 10.12*  CALCIUM 7.7* 8.1* 8.1*   LFT Recent Labs    11/01/21 1307 11/03/21 0055  PROT 6.8  --   ALBUMIN 3.9 3.1*  AST 13*  --   ALT 21  --   ALKPHOS 67  --   BILITOT 0.7  --       LOS: 2 days   [place]  11/03/2021, 12:03 PM

## 2021-11-03 NOTE — Progress Notes (Signed)
PROGRESS NOTE    Allen Mathis  DJS:[id number] DOB: [birth year] DOA: 11/01/2021 PCP: Lyman Bishop, DO    Chief Complaint  Patient presents with   Abnormal Lab    Brief Narrative:    Allen Mathis is a 56 y.o. male with medical history significant for HTN, HLD, Type 2 DM, CKD stage 4 who presents with nausea, vomiting, diarrhea, in ED his work-up significant for ascending creatinine at 14, anemia with hemoglobin of 7.3, with Hemoccult positive stool, significantly dyspneic, hypoxic, requiring 6 L nasal cannula initially, he did require to go on BiPAP overnight as well. -Patient had tunneled catheter inserted 1/27, and he started hemodialysis same day, breathing, mentation and strength significantly improved.   Assessment & Plan:   Principal Problem:   Acute renal failure (ARF) (HCC) Active Problems:   Acute respiratory failure with hypoxia (HCC)   CHF exacerbation (HCC)   Atrial fibrillation with RVR (HCC)   Anemia   High anion gap metabolic acidosis   Type 2 diabetes mellitus with hyperlipidemia (HCC)   HTN (hypertension)     Acute renal failure w/hx of CKD stage IV -Creatinine of 14 from a prior of 3.78 back in June -Management per renal, patient will need to start hemodialysis, especially in the setting of volume overload and poor response to Lasix and respiratory distress. -Management per nephrology, first dialysis 1/27, plan for second session today, remains on IV Lasix with some urine output as well.  Acute hypoxic respiratory failure -improving, required BiPAP for short period overnight, wean oxygen as tolerated  Acute combined systolic/diastolic CHF -CXR obtained showing cardiomegaly with bilateral pleural effusion. BNP of 933 -2D echo with low EF 45 to 50% -Continue with IV Lasix. -Volume management with HD. -Started on beta-blockers, no ACE/ARB/Entresto due to his renal failure   New onset atrial fibrillation with RVR -Back into A. fib with RVR  overnight, started on Toprol-XL -CHA2DS2-VASc score of 3 , need anticoagulation, will initiate once cleared by GI after endoscopy / colonoscopy early next week.     Symptomatic anemia -Hemoglobin of 7.3 down from 12.7 since June 2022 -Received 1 unit PRBC, it is improved to 8 today, on Aranesp and IV iron by renal. -suspect anemia of CKD and also possible GI source with positive FOBT -Input appreciated, plan for EGD/colonoscopy next week. -Started on Protonix.  Anion gap Metabolic acidosis - secondary to Acute renal failure. Monitor after dialysis.    Type 2 diabetes -Controlled with A1c of 4.8.  Hypertension -improving now on metoprolol, continue with Norvasc, as well should continue to improve with dialysis.    Hyperlipidemia Continue statin   DVT prophylaxis: SCD  Code Status: Full Family Communication: Cussed with son and sister at bedside 1/27  Disposition:   Status is: Inpatient  Remains inpatient appropriate because: Will need dialysis, volume overloaded, and respiratory distress       Consultants:  IR  renal GI   Subjective:  patient reports he is feeling much better, he did produce some urine yesterday, had light brown bowel movement yesterday as well, dyspnea significantly improved.  Objective: Vitals:   11/03/21 0901 11/03/21 1031 11/03/21 1053 11/03/21 1204  BP:    (!) 150/82  Pulse:    78  Resp:    19  Temp:    97.6 F (36.4 C)  TempSrc:    Oral  SpO2: 94% 94% 95% 94%  Weight:      Height:        Intake/Output Summary (  Last 24 hours) at 11/03/2021 1225 Last data filed at 11/03/2021 9798 Gross per 24 hour  Intake 1095 ml  Output 1670 ml  Net -575 ml   Filed Weights   11/02/21 1744 11/02/21 2000 11/03/21 0500  Weight: 73 kg 72 kg 72.8 kg    Examination:   Awake Alert, Oriented X 3, No new F.N deficits, in mild distress due to dyspnea Symmetrical Chest wall movement, diminished air entry at the bases with some crackles Irregular,No  Gallops,Rubs or new Murmurs, No Parasternal Heave, has + JVD  +ve B.Sounds, Abd Soft, No tenderness, No rebound - guarding or rigidity. No Cyanosis, Clubbing ,+1 edema, No new Rash or bruise      Data Reviewed: I have personally reviewed following labs and imaging studies  CBC: Recent Labs  Lab 11/01/21 1307 11/02/21 0652 11/03/21 0055  WBC 7.6 8.1 6.3  NEUTROABS 6.3  --   --   HGB 7.3* 7.5* 8.0*  HCT 20.8* 21.4* 22.5*  MCV 85.2 83.6 82.7  PLT 127* 126* 113*    Basic Metabolic Panel: Recent Labs  Lab 11/01/21 1307 11/02/21 0652 11/03/21 0055  NA 136 137 139  K 4.4 4.7 3.9  CL 98 101 101  CO2 18* 14* 23  GLUCOSE 113* 106* 99  BUN 140* 145* 91*  CREATININE 14.18* 15.06* 10.12*  CALCIUM 7.7* 8.1* 8.1*  PHOS  --  11.1* 7.6*    GFR: Estimated Creatinine Clearance: 7 mL/min (A) (by C-G formula based on SCr of 10.12 mg/dL (H)).  Liver Function Tests: Recent Labs  Lab 11/01/21 1307 11/03/21 0055  AST 13*  --   ALT 21  --   ALKPHOS 67  --   BILITOT 0.7  --   PROT 6.8  --   ALBUMIN 3.9 3.1*    CBG: No results for input(s): GLUCAP in the last 168 hours.   Recent Results (from the past 240 hour(s))  Urine Culture     Status: None   Collection Time: 11/01/21  1:08 PM   Specimen: Urine, Clean Catch  Result Value Ref Range Status   Specimen Description   Final    URINE, CLEAN CATCH Performed at [facility], [place] [address], [place], [place] [address]    Special Requests   Final    NONE Performed at [facility], [place] [address] [place], [place] [address]    Culture   Final    NO GROWTH Performed at [facility], [place] [address] [place], [place] [address]    Report Status 11/02/2021 FINAL  Final  Resp Panel by RT-PCR (Flu A&B, Covid) Nasopharyngeal Swab     Status: None   Collection Time: 11/01/21  5:55 PM   Specimen: Nasopharyngeal Swab; Nasopharyngeal(NP) swabs in vial transport medium  Result Value Ref  Range Status   SARS Coronavirus 2 by RT PCR NEGATIVE NEGATIVE Final    Comment: (NOTE) SARS-CoV-2 target nucleic acids are NOT DETECTED.  The SARS-CoV-2 RNA is generally detectable in upper respiratory specimens during the acute phase of infection. The lowest concentration of SARS-CoV-2 viral copies this assay can detect is 138 copies/mL. A negative result does not preclude SARS-Cov-2 infection and should not be used as the sole basis for treatment or other patient management decisions. A negative result may occur with  improper specimen collection/handling, submission of specimen other than nasopharyngeal swab, presence of viral mutation(s) within the areas targeted by this assay, and inadequate number of viral copies(<138 copies/mL).  A negative result must be combined with clinical observations, patient history, and epidemiological information. The expected result is Negative.  Fact Sheet for Patients:  EntrepreneurPulse.com.au  Fact Sheet for Healthcare Providers:  IncredibleEmployment.be  This test is no t yet approved or cleared by the [place] FDA and  has been authorized for detection and/or diagnosis of SARS-CoV-2 by FDA under an Emergency Use Authorization (EUA). This EUA will remain  in effect (meaning this test can be used) for the duration of the COVID-19 declaration under Section 564(b)(1) of the Act, 21 U.S.C.section 360bbb-3(b)(1), unless the authorization is terminated  or revoked sooner.       Influenza A by PCR NEGATIVE NEGATIVE Final   Influenza B by PCR NEGATIVE NEGATIVE Final    Comment: (NOTE) The Xpert Xpress SARS-CoV-2/FLU/RSV plus assay is intended as an aid in the diagnosis of influenza from Nasopharyngeal swab specimens and should not be used as a sole basis for treatment. Nasal washings and aspirates are unacceptable for Xpert Xpress SARS-CoV-2/FLU/RSV testing.  Fact Sheet for  Patients: EntrepreneurPulse.com.au  Fact Sheet for Healthcare Providers: IncredibleEmployment.be  This test is not yet approved or cleared by the [place] FDA and has been authorized for detection and/or diagnosis of SARS-CoV-2 by FDA under an Emergency Use Authorization (EUA). This EUA will remain in effect (meaning this test can be used) for the duration of the COVID-19 declaration under Section 564(b)(1) of the Act, 21 U.S.C. section 360bbb-3(b)(1), unless the authorization is terminated or revoked.  Performed at [facility], [place] [address] [place], [place] [address]          Radiology Studies: US RENAL  Result Date: 11/01/2021 CLINICAL DATA:  Acute renal insufficiency. EXAM: RENAL / URINARY TRACT ULTRASOUND COMPLETE COMPARISON:  Ultrasound dated 02/23/2021. FINDINGS: Right Kidney: Renal measurements: 9.5 x 4.3 x 8.2 cm = volume: 89 mL. The right kidney is atrophic and echogenic. No hydronephrosis or shadowing stone. Left Kidney: Renal measurements: 9.8 x 3.4 x 4.3 cm = volume: 75 mL. The left kidney is atrophic and echogenic. No hydronephrosis or shadowing stone. There is a 2.5 cm inferior pole cyst. Bladder: Appears normal for degree of bladder distention. Other: None. IMPRESSION: Echogenic and atrophic kidneys in keeping with chronic kidney disease. No hydronephrosis or shadowing stone. Electronically Signed   By: Anner Crete M.D.   On: 11/01/2021 19:46   IR Fluoro Guide CV Line Right  Result Date: 11/02/2021 INDICATION: 56 year old with end-stage renal disease. Patient needs a catheter for hemodialysis. EXAM: FLUOROSCOPIC AND ULTRASOUND GUIDED PLACEMENT OF A TUNNELED DIALYSIS CATHETER Physician: Stephan Minister. Anselm Pancoast, MD MEDICATIONS: Ancef 2 g; The antibiotic was administered within an appropriate time interval prior to skin puncture. ANESTHESIA/SEDATION: Moderate (conscious) sedation was employed during this procedure. A  total of Versed 0.5mg  and fentanyl 25 mcg was administered intravenously at the order of the provider performing the procedure. Total intra-service moderate sedation time: 22 minutes. Patient's level of consciousness and vital signs were monitored continuously by radiology nurse throughout the procedure under the supervision of the provider performing the procedure. FLUOROSCOPY TIME:  Fluoroscopy Time: 1 minute, 12 seconds, 8 mGy COMPLICATIONS: None immediate. PROCEDURE: The procedure was explained to the patient. The risks and benefits of the procedure were discussed and the patient's questions were addressed. Informed consent was obtained from the patient. The patient was placed supine on the interventional table. Ultrasound confirmed a patent right internal jugular vein. Ultrasound image obtained for documentation. The right neck and chest was prepped  and draped in a sterile fashion. Maximal barrier sterile technique was utilized including caps, mask, sterile gowns, sterile gloves, sterile drape, hand hygiene and skin antiseptic. The right neck was anesthetized with 1% lidocaine. A small incision was made with #11 blade scalpel. A 21 gauge needle directed into the right internal jugular vein with ultrasound guidance. A micropuncture dilator set was placed. A 19 cm tip to cuff Palindrome catheter was selected. The skin below the right clavicle was anesthetized and a small incision was made with an #11 blade scalpel. A subcutaneous tunnel was formed to the vein dermatotomy site. The catheter was brought through the tunnel. The vein dermatotomy site was dilated to accommodate a peel-away sheath. The catheter was placed through the peel-away sheath and directed into the central venous structures. The tip of the catheter was placed at superior cavoatrial junction with fluoroscopy. Fluoroscopic images were obtained for documentation. Both lumens were found to aspirate and flush well. The proper amount of heparin was  flushed in both lumens. The vein dermatotomy site was closed using a single layer of absorbable suture and Dermabond. Gel-Foam was placed in the subcutaneous tract. The catheter was secured to the skin using Prolene suture. IMPRESSION: Successful placement of a right jugular tunneled dialysis catheter using ultrasound and fluoroscopic guidance. Electronically Signed   By: Markus Daft M.D.   On: 11/02/2021 17:08   DG CHEST PORT 1 VIEW  Result Date: 11/01/2021 CLINICAL DATA:  Proteinuria, dyspnea, diabetes mellitus, smoker EXAM: PORTABLE CHEST 1 VIEW COMPARISON:  Portable exam 1951 hours without priors for comparison FINDINGS: Enlargement of cardiac silhouette. Mediastinal contours normal. Patchy BILATERAL pulmonary infiltrates with bibasilar pleural effusions and atelectasis. No pneumothorax or acute osseous findings. IMPRESSION: BILATERAL pulmonary infiltrates with bibasilar pleural effusions atelectasis associated with cardiomegaly. Findings favor pulmonary edema and CHF though infection is not completely excluded. Electronically Signed   By: Lavonia Dana M.D.   On: 11/01/2021 19:59   ECHOCARDIOGRAM COMPLETE  Result Date: 11/02/2021    ECHOCARDIOGRAM REPORT   Patient Name:   Allen Mathis Date of Exam: 11/02/2021 Medical Rec #:  [id number]        Height:       65.0 in Accession #:    [id number]       Weight:       166.0 lb Date of Birth:  16-Jun-1966        BSA:          1.827 m Patient Age:    33 years         BP:           150/82 mmHg Patient Gender: M                HR:           92 bpm. Exam Location:  Inpatient Procedure: 2D Echo, Color Doppler, Cardiac Doppler and Intracardiac            Opacification Agent Indications:    I51.7 Cardiomegaly  History:        Patient has no prior history of Echocardiogram examinations.                 Arrythmias:Atrial Fibrillation; Risk Factors:Hypertension and                 Diabetes.  Sonographer:    Raquel Sarna Senior RDCS Referring Phys: [id number] [place]   1. Left ventricular ejection fraction, by estimation, is 45 to 50%. The left ventricle  has mildly decreased function. The left ventricle has no regional wall motion abnormalities. Left ventricular diastolic parameters are indeterminate. Elevated left ventricular end-diastolic pressure.  2. Right ventricular systolic function is normal. The right ventricular size is normal.  3. The mitral valve is normal in structure. Mild mitral valve regurgitation. No evidence of mitral stenosis.  4. The aortic valve is normal in structure. Aortic valve regurgitation is not visualized. No aortic stenosis is present.  5. The inferior vena cava is normal in size with greater than 50% respiratory variability, suggesting right atrial pressure of 3 mmHg. FINDINGS  Left Ventricle: Left ventricular ejection fraction, by estimation, is 45 to 50%. The left ventricle has mildly decreased function. The left ventricle has no regional wall motion abnormalities. Definity contrast agent was given IV to delineate the left ventricular endocardial borders. The left ventricular internal cavity size was normal in size. There is no left ventricular hypertrophy. Left ventricular diastolic parameters are indeterminate. Elevated left ventricular end-diastolic pressure. Right Ventricle: The right ventricular size is normal. No increase in right ventricular wall thickness. Right ventricular systolic function is normal. Left Atrium: Left atrial size was normal in size. Right Atrium: Right atrial size was normal in size. Pericardium: There is no evidence of pericardial effusion. Mitral Valve: The mitral valve is normal in structure. Mild mitral valve regurgitation. No evidence of mitral valve stenosis. Tricuspid Valve: The tricuspid valve is normal in structure. Tricuspid valve regurgitation is not demonstrated. No evidence of tricuspid stenosis. Aortic Valve: The aortic valve is normal in structure. Aortic valve regurgitation is not visualized. No aortic  stenosis is present. Pulmonic Valve: The pulmonic valve was normal in structure. Pulmonic valve regurgitation is not visualized. No evidence of pulmonic stenosis. Aorta: The aortic root is normal in size and structure. Venous: The inferior vena cava is normal in size with greater than 50% respiratory variability, suggesting right atrial pressure of 3 mmHg. IAS/Shunts: No atrial level shunt detected by color flow Doppler.  LEFT VENTRICLE PLAX 2D LVIDd:         5.25 cm   Diastology LVIDs:         4.00 cm   LV e' medial:    4.03 cm/s LV PW:         1.00 cm   LV E/e' medial:  28.5 LV IVS:        0.95 cm   LV e' lateral:   8.38 cm/s LVOT diam:     2.20 cm   LV E/e' lateral: 13.7 LV SV:         73 LV SV Index:   40 LVOT Area:     3.80 cm  RIGHT VENTRICLE RV S prime:     15.20 cm/s TAPSE (M-mode): 2.6 cm LEFT ATRIUM             Index        RIGHT ATRIUM           Index LA diam:        4.60 cm 2.52 cm/m   RA Area:     18.00 cm LA Vol (A2C):   72.7 ml 39.78 ml/m  RA Volume:   49.60 ml  27.14 ml/m LA Vol (A4C):   51.3 ml 28.07 ml/m LA Biplane Vol: 62.8 ml 34.37 ml/m  AORTIC VALVE LVOT Vmax:   97.40 cm/s LVOT Vmean:  73.700 cm/s LVOT VTI:    0.193 m  AORTA Ao Root diam: 3.00 cm MITRAL VALVE MV Area (PHT): 4.93 cm  SHUNTS MV Decel Time: 154 msec     Systemic VTI:  0.19 m MV E velocity: 115.00 cm/s  Systemic Diam: 2.20 cm MV A velocity: 106.00 cm/s MV E/A ratio:  1.08 Fransico Him MD Electronically signed by Fransico Him MD Signature Date/Time: 11/02/2021/12:45:26 PM    Final         Scheduled Meds:  sodium chloride   Intravenous Once   atorvastatin  20 mg Oral Daily   Chlorhexidine Gluconate Cloth  6 each Topical Q0600   Chlorhexidine Gluconate Cloth  6 each Topical Q0600   [START ON 11/08/2021] darbepoetin (ARANESP) injection - DIALYSIS  200 mcg Intravenous Q Thu-HD   feeding supplement (NEPRO CARB STEADY)  237 mL Oral BID BM   metoprolol succinate  50 mg Oral Daily   pantoprazole  40 mg Oral BID AC    Continuous Infusions:  ferric gluconate (FERRLECIT) IVPB     furosemide 200 mg (11/03/21 1203)     LOS: 2 days       Phillips Climes, MD [facility]   To contact the attending provider between 7A-7P or the covering provider during after hours 7P-7A, please log into the web site [email or web address] and access using universal [place] password for that web site. If you do not have the password, please call the hospital operator.  11/03/2021, 12:25 PM   Patient ID: Allen Mathis, male   DOB: [birth year], 56 y.o.   MRN: [id number] Patient ID: Allen Mathis, male   DOB: 03-30-1966, 56 y.o.   MRN: [id number]

## 2021-11-04 DIAGNOSIS — J9601 Acute respiratory failure with hypoxia: Secondary | ICD-10-CM | POA: Diagnosis not present

## 2021-11-04 DIAGNOSIS — D500 Iron deficiency anemia secondary to blood loss (chronic): Secondary | ICD-10-CM | POA: Diagnosis not present

## 2021-11-04 DIAGNOSIS — N179 Acute kidney failure, unspecified: Secondary | ICD-10-CM | POA: Diagnosis not present

## 2021-11-04 LAB — CBC
HCT: 21.9 % — ABNORMAL LOW (ref 39.0–52.0)
Hemoglobin: 8 g/dL — ABNORMAL LOW (ref 13.0–17.0)
MCH: 30.4 pg (ref 26.0–34.0)
MCHC: 36.5 g/dL — ABNORMAL HIGH (ref 30.0–36.0)
MCV: 83.3 fL (ref 80.0–100.0)
Platelets: 120 10*3/uL — ABNORMAL LOW (ref 150–400)
RBC: 2.63 MIL/uL — ABNORMAL LOW (ref 4.22–5.81)
RDW: 13 % (ref 11.5–15.5)
WBC: 6.5 10*3/uL (ref 4.0–10.5)
nRBC: 0 % (ref 0.0–0.2)

## 2021-11-04 LAB — RENAL FUNCTION PANEL
Albumin: 2.8 g/dL — ABNORMAL LOW (ref 3.5–5.0)
Anion gap: 14 (ref 5–15)
BUN: 55 mg/dL — ABNORMAL HIGH (ref 6–20)
CO2: 24 mmol/L (ref 22–32)
Calcium: 8 mg/dL — ABNORMAL LOW (ref 8.9–10.3)
Chloride: 99 mmol/L (ref 98–111)
Creatinine, Ser: 6.95 mg/dL — ABNORMAL HIGH (ref 0.61–1.24)
GFR, Estimated: 9 mL/min — ABNORMAL LOW (ref >60)
Glucose, Bld: 118 mg/dL — ABNORMAL HIGH (ref 70–99)
Phosphorus: 5.8 mg/dL — ABNORMAL HIGH (ref 2.5–4.6)
Potassium: 3.6 mmol/L (ref 3.5–5.1)
Sodium: 137 mmol/L (ref 135–145)

## 2021-11-04 NOTE — Progress Notes (Signed)
PROGRESS NOTE    Allen Mathis  KAJ:[id number] DOB: January 06, 1966 DOA: 11/01/2021 PCP: Lyman Bishop, DO    Chief Complaint  Patient presents with   Abnormal Lab    Brief Narrative:    Allen Mathis is a 56 y.o. male with medical history significant for HTN, HLD, Type 2 DM, CKD stage 4 who presents with nausea, vomiting, diarrhea, in ED his work-up significant for ascending creatinine at 14, anemia with hemoglobin of 7.3, with Hemoccult positive stool, significantly dyspneic, hypoxic, requiring 6 L nasal cannula initially, he did require to go on BiPAP overnight as well. -Patient had tunneled catheter inserted 1/27, he was dialyzed the same day, dialyzed 2 days back-to-back, had significant improvement of his dyspnea,    Assessment & Plan:   Principal Problem:   Acute renal failure (ARF) ([place]) Active Problems:   Acute respiratory failure with hypoxia ([place])   CHF exacerbation (HCC)   Atrial fibrillation with RVR (HCC)   Anemia   High anion gap metabolic acidosis   Type 2 diabetes mellitus with hyperlipidemia ([place])   HTN (hypertension)     Acute renal failure w/hx of CKD stage IV -Creatinine of 14 from a prior of 3.78 back in June -Management per renal, this is likely progression of his CKD, patient will need to start hemodialysis, especially in the setting of volume overload and poor response to Lasix and respiratory distress. -Management per nephrology, first dialysis 1/27, other HD yesterday 1/28, patient reports is feeling much better currently, no dyspnea  Acute hypoxic respiratory failure -improving, required BiPAP for short period overnight, wean oxygen as tolerated - resolved   Acute combined systolic/diastolic CHF -CXR obtained showing cardiomegaly with bilateral pleural effusion. BNP of 933 -2D echo with low EF 45 to 50% -Continue with IV Lasix. -Volume management with HD. -Started on beta-blockers, -  No ACE/ARB/Entresto due to his renal failure    New onset atrial fibrillation with RVR -Back into A. fib with RVR overnight, started on Toprol-XL,  he is having some intermittent A. fib with RVR episode so added as needed metoprolol.  -CHA2DS2-VASc score of 3 , need anticoagulation, will initiate once cleared by GI after endoscopy / colonoscopy early next week.     Symptomatic anemia -Hemoglobin of 7.3 down from 12.7 since June 2022 -Received 1 unit PRBC, it is improved to 8 today, on Aranesp and IV iron by renal. -suspect anemia of CKD and also possible GI source with positive FOBT -GI input greatly appreciated, timing for EGD/colonoscopy per GI -Started on Protonix.  Anion gap Metabolic acidosis - secondary to Acute renal failure. Monitor after dialysis.    Type 2 diabetes -Controlled with A1c of 4.8.  Hypertension -improving now on metoprolol, continue with Norvasc, as well should continue to improve with dialysis.    Hyperlipidemia Continue statin   DVT prophylaxis: SCD  Code Status: Full Family Communication: Discussed with sister at bedside 1/29 Disposition:   Status is: Inpatient  Remains inpatient appropriate because: Will need dialysis, volume overloaded, and respiratory distress       Consultants:  IR  renal GI   Subjective:  No dyspnea, report is feeling better, brown color BM yesterday. Objective: Vitals:   11/04/21 1145 11/04/21 1150 11/04/21 1200 11/04/21 1205  BP:    129/76  Pulse:   69 65  Resp: 18 16 16 19   Temp:      TempSrc:      SpO2:   (!) 87% 97%  Weight:  Height:        Intake/Output Summary (Last 24 hours) at 11/04/2021 1458 Last data filed at 11/04/2021 0600 Gross per 24 hour  Intake 345 ml  Output 2890 ml  Net -2545 ml   Filed Weights   11/03/21 1349 11/03/21 1655 11/04/21 0500  Weight: 73 kg 71 kg 74.2 kg    Examination:   Awake Alert, Oriented X 3, No new F.N deficits, Normal affect Symmetrical Chest wall movement, Good air movement bilaterally, CTAB RRR,No  Gallops,Rubs or new Murmurs, No Parasternal Heave +ve B.Sounds, Abd Soft, No tenderness, No rebound - guarding or rigidity. No Cyanosis, Clubbing or edema, No new Rash or bruise       Data Reviewed: I have personally reviewed following labs and imaging studies  CBC: Recent Labs  Lab 11/01/21 1307 11/02/21 0652 11/03/21 0055 11/04/21 0245  WBC 7.6 8.1 6.3 6.5  NEUTROABS 6.3  --   --   --   HGB 7.3* 7.5* 8.0* 8.0*  HCT 20.8* 21.4* 22.5* 21.9*  MCV 85.2 83.6 82.7 83.3  PLT 127* 126* 113* 120*    Basic Metabolic Panel: Recent Labs  Lab 11/01/21 1307 11/02/21 0652 11/03/21 0055 11/04/21 0245  NA 136 137 139 137  K 4.4 4.7 3.9 3.6  CL 98 101 101 99  CO2 18* 14* 23 24  GLUCOSE 113* 106* 99 118*  BUN 140* 145* 91* 55*  CREATININE 14.18* 15.06* 10.12* 6.95*  CALCIUM 7.7* 8.1* 8.1* 8.0*  PHOS  --  11.1* 7.6* 5.8*    GFR: Estimated Creatinine Clearance: 11 mL/min (A) (by C-G formula based on SCr of 6.95 mg/dL (H)).  Liver Function Tests: Recent Labs  Lab 11/01/21 1307 11/03/21 0055 11/04/21 0245  AST 13*  --   --   ALT 21  --   --   ALKPHOS 67  --   --   BILITOT 0.7  --   --   PROT 6.8  --   --   ALBUMIN 3.9 3.1* 2.8*    CBG: No results for input(s): GLUCAP in the last 168 hours.   Recent Results (from the past 240 hour(s))  Urine Culture     Status: None   Collection Time: 11/01/21  1:08 PM   Specimen: Urine, Clean Catch  Result Value Ref Range Status   Specimen Description   Final    URINE, CLEAN CATCH Performed at [facility], [place] [address], [place], [place] [address]    Special Requests   Final    NONE Performed at [facility], [place] [address], [place], [place] [address]    Culture   Final    NO GROWTH Performed at [facility], [place] [address] [place], [place] [address]    Report Status 11/02/2021 FINAL  Final  Resp Panel by RT-PCR (Flu A&B, Covid) Nasopharyngeal Swab     Status: None    Collection Time: 11/01/21  5:55 PM   Specimen: Nasopharyngeal Swab; Nasopharyngeal(NP) swabs in vial transport medium  Result Value Ref Range Status   SARS Coronavirus 2 by RT PCR NEGATIVE NEGATIVE Final    Comment: (NOTE) SARS-CoV-2 target nucleic acids are NOT DETECTED.  The SARS-CoV-2 RNA is generally detectable in upper respiratory specimens during the acute phase of infection. The lowest concentration of SARS-CoV-2 viral copies this assay can detect is 138 copies/mL. A negative result does not preclude SARS-Cov-2 infection and should not be used as the sole basis for treatment or other  patient management decisions. A negative result may occur with  improper specimen collection/handling, submission of specimen other than nasopharyngeal swab, presence of viral mutation(s) within the areas targeted by this assay, and inadequate number of viral copies(<138 copies/mL). A negative result must be combined with clinical observations, patient history, and epidemiological information. The expected result is Negative.  Fact Sheet for Patients:  EntrepreneurPulse.com.au  Fact Sheet for Healthcare Providers:  IncredibleEmployment.be  This test is no t yet approved or cleared by the [place] FDA and  has been authorized for detection and/or diagnosis of SARS-CoV-2 by FDA under an Emergency Use Authorization (EUA). This EUA will remain  in effect (meaning this test can be used) for the duration of the COVID-19 declaration under Section 564(b)(1) of the Act, 21 U.S.C.section 360bbb-3(b)(1), unless the authorization is terminated  or revoked sooner.       Influenza A by PCR NEGATIVE NEGATIVE Final   Influenza B by PCR NEGATIVE NEGATIVE Final    Comment: (NOTE) The Xpert Xpress SARS-CoV-2/FLU/RSV plus assay is intended as an aid in the diagnosis of influenza from Nasopharyngeal swab specimens and should not be used as a sole basis for treatment.  Nasal washings and aspirates are unacceptable for Xpert Xpress SARS-CoV-2/FLU/RSV testing.  Fact Sheet for Patients: EntrepreneurPulse.com.au  Fact Sheet for Healthcare Providers: IncredibleEmployment.be  This test is not yet approved or cleared by the [place] FDA and has been authorized for detection and/or diagnosis of SARS-CoV-2 by FDA under an Emergency Use Authorization (EUA). This EUA will remain in effect (meaning this test can be used) for the duration of the COVID-19 declaration under Section 564(b)(1) of the Act, 21 U.S.C. section 360bbb-3(b)(1), unless the authorization is terminated or revoked.  Performed at [facility], [place] [address] [place], [place] [address]          Radiology Studies: IR Fluoro Guide CV Line Right  Result Date: 11/02/2021 INDICATION: 56 year old with end-stage renal disease. Patient needs a catheter for hemodialysis. EXAM: FLUOROSCOPIC AND ULTRASOUND GUIDED PLACEMENT OF A TUNNELED DIALYSIS CATHETER Physician: Stephan Minister. Anselm Pancoast, MD MEDICATIONS: Ancef 2 g; The antibiotic was administered within an appropriate time interval prior to skin puncture. ANESTHESIA/SEDATION: Moderate (conscious) sedation was employed during this procedure. A total of Versed 0.5mg  and fentanyl 25 mcg was administered intravenously at the order of the provider performing the procedure. Total intra-service moderate sedation time: 22 minutes. Patient's level of consciousness and vital signs were monitored continuously by radiology nurse throughout the procedure under the supervision of the provider performing the procedure. FLUOROSCOPY TIME:  Fluoroscopy Time: 1 minute, 12 seconds, 8 mGy COMPLICATIONS: None immediate. PROCEDURE: The procedure was explained to the patient. The risks and benefits of the procedure were discussed and the patient's questions were addressed. Informed consent was obtained from the patient. The patient  was placed supine on the interventional table. Ultrasound confirmed a patent right internal jugular vein. Ultrasound image obtained for documentation. The right neck and chest was prepped and draped in a sterile fashion. Maximal barrier sterile technique was utilized including caps, mask, sterile gowns, sterile gloves, sterile drape, hand hygiene and skin antiseptic. The right neck was anesthetized with 1% lidocaine. A small incision was made with #11 blade scalpel. A 21 gauge needle directed into the right internal jugular vein with ultrasound guidance. A micropuncture dilator set was placed. A 19 cm tip to cuff Palindrome catheter was selected. The skin below the right clavicle was anesthetized and a small incision was made with an #  11 blade scalpel. A subcutaneous tunnel was formed to the vein dermatotomy site. The catheter was brought through the tunnel. The vein dermatotomy site was dilated to accommodate a peel-away sheath. The catheter was placed through the peel-away sheath and directed into the central venous structures. The tip of the catheter was placed at superior cavoatrial junction with fluoroscopy. Fluoroscopic images were obtained for documentation. Both lumens were found to aspirate and flush well. The proper amount of heparin was flushed in both lumens. The vein dermatotomy site was closed using a single layer of absorbable suture and Dermabond. Gel-Foam was placed in the subcutaneous tract. The catheter was secured to the skin using Prolene suture. IMPRESSION: Successful placement of a right jugular tunneled dialysis catheter using ultrasound and fluoroscopic guidance. Electronically Signed   By: Markus Daft M.D.   On: 11/02/2021 17:08        Scheduled Meds:  sodium chloride   Intravenous Once   atorvastatin  20 mg Oral Daily   Chlorhexidine Gluconate Cloth  6 each Topical Q0600   Chlorhexidine Gluconate Cloth  6 each Topical Q0600   [START ON 11/08/2021] darbepoetin (ARANESP) injection -  DIALYSIS  200 mcg Intravenous Q Thu-HD   feeding supplement (NEPRO CARB STEADY)  237 mL Oral BID BM   metoprolol succinate  50 mg Oral Daily   pantoprazole  40 mg Oral BID AC   Continuous Infusions:  ferric gluconate (FERRLECIT) IVPB 250 mg (11/04/21 0802)   furosemide 200 mg (11/04/21 1203)     LOS: 3 days       Phillips Climes, MD [facility]   To contact the attending provider between 7A-7P or the covering provider during after hours 7P-7A, please log into the web site [email or web address] and access using universal  password for that web site. If you do not have the password, please call the hospital operator.  11/04/2021, 2:58 PM   Patient ID: STEPHANIE MCGLONE, male   DOB: 1966/10/03, 56 y.o.   MRN: [id number] Patient ID: SAYYID HAREWOOD, male   DOB: 12/29/1965, 56 y.o.   MRN: [id number] Patient ID: LOURDES MANNING, male   DOB: 1966-03-18, 56 y.o.   MRN: [id number]

## 2021-11-04 NOTE — Progress Notes (Signed)
Pt ambulates around the room well with no supervision, on room air, and has no s/s of distress or discomfort.

## 2021-11-04 NOTE — Progress Notes (Signed)
Subjective:  Had second HD yest-  removed 2 liters also had over a liter of UOP-  no issues overnight-  on room air-  labs reflective of HD yesterday   Objective Vital signs in last 24 hours: Vitals:   11/04/21 0500 11/04/21 0600 11/04/21 0700 11/04/21 0807  BP:    140/63  Pulse: 70 77 85 80  Resp: 15 (!) 22 (!) 23 15  Temp:    (!) 97.5 F (36.4 C)  TempSrc:    Oral  SpO2: 92% 97% 100% 94%  Weight: 74.2 kg     Height:       Weight change: 0 kg  Intake/Output Summary (Last 24 hours) at 11/04/2021 1042 Last data filed at 11/04/2021 0600 Gross per 24 hour  Intake 345 ml  Output 2890 ml  Net -2545 ml    Assessment/ Plan: Pt is a 56 y.o. yo male who was admitted on 11/01/2021 with uremia and volume overload in the setting of worsening renal failure  Assessment/Plan: 1. Renal-  crt of 4   6 mos ago-  small echogenic kidneys on u/s-  we all suspect that this is just progression of ckd and now ESRD but could have acute component as well-  support with HD on 1/27 and second treatment yesterday with good results.  Plan is to monitor-  see what his labs do the next 24-48 hours-  if if is looking like esrd will put plans in place for permanent access and OP placement -   2. HTN/vol-  overloaded-  some UOP with big dose diuretics to keep him out of trouble and also utilizing HD.  Continue high dose diuretics for now -  anticipate BP will improve as volume improves -  is already -  also on toprol 3. Anemia-   most likely due to CKD-   iron stores low- repleting and also gave esa-  4. Secondary hyperparathyroidism- PTH 265-  no meds-  will see what phos does with HD for now -  down to 5.8   [facility]    Labs: Basic Metabolic Panel: Recent Labs  Lab 11/02/21 0652 11/03/21 0055 11/04/21 0245  NA 137 139 137  K 4.7 3.9 3.6  CL 101 101 99  CO2 14* 23 24  GLUCOSE 106* 99 118*  BUN 145* 91* 55*  CREATININE 15.06* 10.12* 6.95*  CALCIUM 8.1* 8.1* 8.0*  PHOS 11.1* 7.6* 5.8*    Liver Function Tests: Recent Labs  Lab 11/01/21 1307 11/03/21 0055 11/04/21 0245  AST 13*  --   --   ALT 21  --   --   ALKPHOS 67  --   --   BILITOT 0.7  --   --   PROT 6.8  --   --   ALBUMIN 3.9 3.1* 2.8*   No results for input(s): LIPASE, AMYLASE in the last 168 hours. No results for input(s): AMMONIA in the last 168 hours. CBC: Recent Labs  Lab 11/01/21 1307 11/02/21 0652 11/03/21 0055 11/04/21 0245  WBC 7.6 8.1 6.3 6.5  NEUTROABS 6.3  --   --   --   HGB 7.3* 7.5* 8.0* 8.0*  HCT 20.8* 21.4* 22.5* 21.9*  MCV 85.2 83.6 82.7 83.3  PLT 127* 126* 113* 120*   Cardiac Enzymes: No results for input(s): CKTOTAL, CKMB, CKMBINDEX, TROPONINI in the last 168 hours. CBG: No results for input(s): GLUCAP in the last 168 hours.  Iron Studies:  Recent Labs    11/02/21 [phone]  IRON 30*  TIBC 276  FERRITIN 420*   Studies/Results: IR Fluoro Guide CV Line Right  Result Date: 11/02/2021 INDICATION: 56 year old with end-stage renal disease. Patient needs a catheter for hemodialysis. EXAM: FLUOROSCOPIC AND ULTRASOUND GUIDED PLACEMENT OF A TUNNELED DIALYSIS CATHETER Physician: Stephan Minister. Anselm Pancoast, MD MEDICATIONS: Ancef 2 g; The antibiotic was administered within an appropriate time interval prior to skin puncture. ANESTHESIA/SEDATION: Moderate (conscious) sedation was employed during this procedure. A total of Versed 0.5mg  and fentanyl 25 mcg was administered intravenously at the order of the provider performing the procedure. Total intra-service moderate sedation time: 22 minutes. Patient's level of consciousness and vital signs were monitored continuously by radiology nurse throughout the procedure under the supervision of the provider performing the procedure. FLUOROSCOPY TIME:  Fluoroscopy Time: 1 minute, 12 seconds, 8 mGy COMPLICATIONS: None immediate. PROCEDURE: The procedure was explained to the patient. The risks and benefits of the procedure were discussed and the patient's questions were  addressed. Informed consent was obtained from the patient. The patient was placed supine on the interventional table. Ultrasound confirmed a patent right internal jugular vein. Ultrasound image obtained for documentation. The right neck and chest was prepped and draped in a sterile fashion. Maximal barrier sterile technique was utilized including caps, mask, sterile gowns, sterile gloves, sterile drape, hand hygiene and skin antiseptic. The right neck was anesthetized with 1% lidocaine. A small incision was made with #11 blade scalpel. A 21 gauge needle directed into the right internal jugular vein with ultrasound guidance. A micropuncture dilator set was placed. A 19 cm tip to cuff Palindrome catheter was selected. The skin below the right clavicle was anesthetized and a small incision was made with an #11 blade scalpel. A subcutaneous tunnel was formed to the vein dermatotomy site. The catheter was brought through the tunnel. The vein dermatotomy site was dilated to accommodate a peel-away sheath. The catheter was placed through the peel-away sheath and directed into the central venous structures. The tip of the catheter was placed at superior cavoatrial junction with fluoroscopy. Fluoroscopic images were obtained for documentation. Both lumens were found to aspirate and flush well. The proper amount of heparin was flushed in both lumens. The vein dermatotomy site was closed using a single layer of absorbable suture and Dermabond. Gel-Foam was placed in the subcutaneous tract. The catheter was secured to the skin using Prolene suture. IMPRESSION: Successful placement of a right jugular tunneled dialysis catheter using ultrasound and fluoroscopic guidance. Electronically Signed   By: Markus Daft M.D.   On: 11/02/2021 17:08   ECHOCARDIOGRAM COMPLETE  Result Date: 11/02/2021    ECHOCARDIOGRAM REPORT   Patient Name:   Allen Mathis Date of Exam: 11/02/2021 Medical Rec #:  [id number]        Height:       65.0 in  Accession #:    [id number]       Weight:       166.0 lb Date of Birth:  10/29/1965        BSA:          1.827 m Patient Age:    21 years         BP:           150/82 mmHg Patient Gender: M                HR:           92 bpm. Exam Location:  Inpatient Procedure: 2D Echo, Color Doppler, Cardiac Doppler and  Intracardiac            Opacification Agent Indications:    I51.7 Cardiomegaly  History:        Patient has no prior history of Echocardiogram examinations.                 Arrythmias:Atrial Fibrillation; Risk Factors:Hypertension and                 Diabetes.  Sonographer:    Raquel Sarna Senior RDCS Referring Phys: [id number] [place]  1. Left ventricular ejection fraction, by estimation, is 45 to 50%. The left ventricle has mildly decreased function. The left ventricle has no regional wall motion abnormalities. Left ventricular diastolic parameters are indeterminate. Elevated left ventricular end-diastolic pressure.  2. Right ventricular systolic function is normal. The right ventricular size is normal.  3. The mitral valve is normal in structure. Mild mitral valve regurgitation. No evidence of mitral stenosis.  4. The aortic valve is normal in structure. Aortic valve regurgitation is not visualized. No aortic stenosis is present.  5. The inferior vena cava is normal in size with greater than 50% respiratory variability, suggesting right atrial pressure of 3 mmHg. FINDINGS  Left Ventricle: Left ventricular ejection fraction, by estimation, is 45 to 50%. The left ventricle has mildly decreased function. The left ventricle has no regional wall motion abnormalities. Definity contrast agent was given IV to delineate the left ventricular endocardial borders. The left ventricular internal cavity size was normal in size. There is no left ventricular hypertrophy. Left ventricular diastolic parameters are indeterminate. Elevated left ventricular end-diastolic pressure. Right Ventricle: The right ventricular size is  normal. No increase in right ventricular wall thickness. Right ventricular systolic function is normal. Left Atrium: Left atrial size was normal in size. Right Atrium: Right atrial size was normal in size. Pericardium: There is no evidence of pericardial effusion. Mitral Valve: The mitral valve is normal in structure. Mild mitral valve regurgitation. No evidence of mitral valve stenosis. Tricuspid Valve: The tricuspid valve is normal in structure. Tricuspid valve regurgitation is not demonstrated. No evidence of tricuspid stenosis. Aortic Valve: The aortic valve is normal in structure. Aortic valve regurgitation is not visualized. No aortic stenosis is present. Pulmonic Valve: The pulmonic valve was normal in structure. Pulmonic valve regurgitation is not visualized. No evidence of pulmonic stenosis. Aorta: The aortic root is normal in size and structure. Venous: The inferior vena cava is normal in size with greater than 50% respiratory variability, suggesting right atrial pressure of 3 mmHg. IAS/Shunts: No atrial level shunt detected by color flow Doppler.  LEFT VENTRICLE PLAX 2D LVIDd:         5.25 cm   Diastology LVIDs:         4.00 cm   LV e' medial:    4.03 cm/s LV PW:         1.00 cm   LV E/e' medial:  28.5 LV IVS:        0.95 cm   LV e' lateral:   8.38 cm/s LVOT diam:     2.20 cm   LV E/e' lateral: 13.7 LV SV:         73 LV SV Index:   40 LVOT Area:     3.80 cm  RIGHT VENTRICLE RV S prime:     15.20 cm/s TAPSE (M-mode): 2.6 cm LEFT ATRIUM             Index        RIGHT  ATRIUM           Index LA diam:        4.60 cm 2.52 cm/m   RA Area:     18.00 cm LA Vol (A2C):   72.7 ml 39.78 ml/m  RA Volume:   49.60 ml  27.14 ml/m LA Vol (A4C):   51.3 ml 28.07 ml/m LA Biplane Vol: 62.8 ml 34.37 ml/m  AORTIC VALVE LVOT Vmax:   97.40 cm/s LVOT Vmean:  73.700 cm/s LVOT VTI:    0.193 m  AORTA Ao Root diam: 3.00 cm MITRAL VALVE MV Area (PHT): 4.93 cm     SHUNTS MV Decel Time: 154 msec     Systemic VTI:  0.19 m MV E  velocity: 115.00 cm/s  Systemic Diam: 2.20 cm MV A velocity: 106.00 cm/s MV E/A ratio:  1.08 Fransico Him MD Electronically signed by Fransico Him MD Signature Date/Time: 11/02/2021/12:45:26 PM    Final    Medications: Infusions:  ferric gluconate (FERRLECIT) IVPB 250 mg (11/04/21 0802)   furosemide 200 mg (11/04/21 0155)    Scheduled Medications:  sodium chloride   Intravenous Once   atorvastatin  20 mg Oral Daily   Chlorhexidine Gluconate Cloth  6 each Topical Q0600   Chlorhexidine Gluconate Cloth  6 each Topical Q0600   [START ON 11/08/2021] darbepoetin (ARANESP) injection - DIALYSIS  200 mcg Intravenous Q Thu-HD   feeding supplement (NEPRO CARB STEADY)  237 mL Oral BID BM   metoprolol succinate  50 mg Oral Daily   pantoprazole  40 mg Oral BID AC    have reviewed scheduled and prn medications.  Physical Exam: General: much more comfortable-  on room air Heart: RRR Lungs: dec BS at bases Abdomen: soft, non tender Extremities:  edema - improved  Dialysis Access: right sided tunneled HD cath    11/04/2021,10:42 AM  LOS: 3 days

## 2021-11-05 DIAGNOSIS — J9601 Acute respiratory failure with hypoxia: Secondary | ICD-10-CM | POA: Diagnosis not present

## 2021-11-05 DIAGNOSIS — N184 Chronic kidney disease, stage 4 (severe): Secondary | ICD-10-CM

## 2021-11-05 DIAGNOSIS — I4891 Unspecified atrial fibrillation: Secondary | ICD-10-CM | POA: Diagnosis not present

## 2021-11-05 DIAGNOSIS — N179 Acute kidney failure, unspecified: Secondary | ICD-10-CM | POA: Diagnosis not present

## 2021-11-05 DIAGNOSIS — I10 Essential (primary) hypertension: Secondary | ICD-10-CM | POA: Diagnosis not present

## 2021-11-05 DIAGNOSIS — D500 Iron deficiency anemia secondary to blood loss (chronic): Secondary | ICD-10-CM | POA: Diagnosis not present

## 2021-11-05 LAB — TYPE AND SCREEN
ABO/RH(D): A POS
Antibody Screen: NEGATIVE
Unit division: 0

## 2021-11-05 LAB — CBC
HCT: 22.2 % — ABNORMAL LOW (ref 39.0–52.0)
Hemoglobin: 7.7 g/dL — ABNORMAL LOW (ref 13.0–17.0)
MCH: 29.6 pg (ref 26.0–34.0)
MCHC: 34.7 g/dL (ref 30.0–36.0)
MCV: 85.4 fL (ref 80.0–100.0)
Platelets: 125 10*3/uL — ABNORMAL LOW (ref 150–400)
RBC: 2.6 MIL/uL — ABNORMAL LOW (ref 4.22–5.81)
RDW: 13 % (ref 11.5–15.5)
WBC: 7.3 10*3/uL (ref 4.0–10.5)
nRBC: 0 % (ref 0.0–0.2)

## 2021-11-05 LAB — PREPARE RBC (CROSSMATCH)

## 2021-11-05 LAB — RENAL FUNCTION PANEL
Albumin: 2.9 g/dL — ABNORMAL LOW (ref 3.5–5.0)
Anion gap: 14 (ref 5–15)
BUN: 74 mg/dL — ABNORMAL HIGH (ref 6–20)
CO2: 23 mmol/L (ref 22–32)
Calcium: 8 mg/dL — ABNORMAL LOW (ref 8.9–10.3)
Chloride: 100 mmol/L (ref 98–111)
Creatinine, Ser: 8.56 mg/dL — ABNORMAL HIGH (ref 0.61–1.24)
GFR, Estimated: 7 mL/min — ABNORMAL LOW
Glucose, Bld: 125 mg/dL — ABNORMAL HIGH (ref 70–99)
Phosphorus: 7 mg/dL — ABNORMAL HIGH (ref 2.5–4.6)
Potassium: 4.2 mmol/L (ref 3.5–5.1)
Sodium: 137 mmol/L (ref 135–145)

## 2021-11-05 LAB — BPAM RBC
Blood Product Expiration Date: 202302132359
Unit Type and Rh: 6200

## 2021-11-05 MED ORDER — SODIUM CHLORIDE 0.9% IV SOLUTION: Freq: Once | INTRAVENOUS | Status: DC

## 2021-11-05 MED ORDER — RENA-VITE PO TABS
1.0000 | ORAL_TABLET | Freq: Every day | ORAL | Status: DC
  Administered 2021-11-05 – 2021-11-08 (×4): 1 via ORAL
  Filled 2021-11-05 (×4): qty 1

## 2021-11-05 MED ORDER — FUROSEMIDE 40 MG PO TABS
120.0000 mg | ORAL_TABLET | Freq: Two times a day (BID) | ORAL | Status: DC
  Administered 2021-11-05 – 2021-11-07 (×3): 120 mg via ORAL
  Filled 2021-11-05 (×3): qty 3

## 2021-11-05 NOTE — Progress Notes (Signed)
Subjective: Patient states he feels really well today.  Denies significant shortness of breath or chest pain.  No nausea or vomiting.  Objective Vital signs in last 24 hours: Vitals:   11/05/21 0443 11/05/21 0804 11/05/21 0900 11/05/21 0959  BP: 128/76 (!) 168/95 (!) 153/88 (!) 142/82  Pulse: 75 78 70 61  Resp: 20 14 19 18   Temp: 97.7 F (36.5 C) 97.7 F (36.5 C) 97.7 F (36.5 C) 97.8 F (36.6 C)  TempSrc: Oral Oral Oral Oral  SpO2: 93% 98% 99% 99%  Weight: 71.8 kg     Height:       Weight change: -1.2 kg  Intake/Output Summary (Last 24 hours) at 11/05/2021 1115 Last data filed at 11/05/2021 0500 Gross per 24 hour  Intake 190 ml  Output 600 ml  Net -410 ml    Assessment/ Plan: Pt is a 56 y.o. yo male who was admitted on 11/01/2021 with uremia and volume overload in the setting of worsening renal failure  Assessment/Plan: 1. Renal-  crt of 4   6 mos ago-  small echogenic kidneys on u/s-  we all suspect that this is just progression of ckd and now ESRD-  Had HD 1/27 and 1/28 with improvement in symptoms. Patient has made more urine so wants to watch progress off HD but given rise in BUN and Crt today I will plan on HD tomorrow. If Crt still rising tomorrow will also need to consult VVS with [facility] and AVF/G creation. 2. HTN/vol-volume status and hypertension much improved.  Switch Lasix to 120 mg twice daily oral.  Continue metoprolol 3. Anemia-   most likely due to CKD-   iron stores low- repleting and also gave esa-receiving transfusion today 4. Secondary hyperparathyroidism- PTH 265-  no meds-  will see what phos does with HD for now   [place]: Basic Metabolic Panel: Recent Labs  Lab 11/03/21 0055 11/04/21 0245 11/05/21 0032  NA 139 137 137  K 3.9 3.6 4.2  CL 101 99 100  CO2 23 24 23   GLUCOSE 99 118* 125*  BUN 91* 55* 74*  CREATININE 10.12* 6.95* 8.56*  CALCIUM 8.1* 8.0* 8.0*  PHOS 7.6* 5.8* 7.0*   Liver Function Tests: Recent Labs  Lab  11/01/21 1307 11/03/21 0055 11/04/21 0245 11/05/21 0032  AST 13*  --   --   --   ALT 21  --   --   --   ALKPHOS 67  --   --   --   BILITOT 0.7  --   --   --   PROT 6.8  --   --   --   ALBUMIN 3.9 3.1* 2.8* 2.9*   No results for input(s): LIPASE, AMYLASE in the last 168 hours. No results for input(s): AMMONIA in the last 168 hours. CBC: Recent Labs  Lab 11/01/21 1307 11/02/21 0652 11/03/21 0055 11/04/21 0245 11/05/21 0032  WBC 7.6 8.1 6.3 6.5 7.3  NEUTROABS 6.3  --   --   --   --   HGB 7.3* 7.5* 8.0* 8.0* 7.7*  HCT 20.8* 21.4* 22.5* 21.9* 22.2*  MCV 85.2 83.6 82.7 83.3 85.4  PLT 127* 126* 113* 120* 125*   Cardiac Enzymes: No results for input(s): CKTOTAL, CKMB, CKMBINDEX, TROPONINI in the last 168 hours. CBG: No results for input(s): GLUCAP in the last 168 hours.  Iron Studies:  No results for input(s): IRON, TIBC, TRANSFERRIN, FERRITIN in the last 72 hours.  Studies/Results:  No results found. Medications: Infusions:  ferric gluconate (FERRLECIT) IVPB 250 mg (11/04/21 0802)   furosemide 200 mg (11/05/21 0116)    Scheduled Medications:  sodium chloride   Intravenous Once   sodium chloride   Intravenous Once   atorvastatin  20 mg Oral Daily   Chlorhexidine Gluconate Cloth  6 each Topical Q0600   Chlorhexidine Gluconate Cloth  6 each Topical Q0600   [START ON 11/08/2021] darbepoetin (ARANESP) injection - DIALYSIS  200 mcg Intravenous Q Thu-HD   feeding supplement (NEPRO CARB STEADY)  237 mL Oral BID BM   metoprolol succinate  50 mg Oral Daily   pantoprazole  40 mg Oral BID AC    have reviewed scheduled and prn medications.  Physical Exam: General: much more comfortable-  on room air Heart: RRR Lungs: dec BS at bases Abdomen: soft, non tender Extremities:  edema - improved  Dialysis Access: right sided tunneled HD cath    11/05/2021,11:15 AM  LOS: 4 days

## 2021-11-05 NOTE — Progress Notes (Signed)
Initial Nutrition Assessment  DOCUMENTATION CODES:   Not applicable  INTERVENTION:   Renal Multivitamin w/ minerals daily Continue Nepro Shake po BID, each supplement provides 425 kcal and 19 grams protein Encourage good PO intake  NUTRITION DIAGNOSIS:   Increased nutrient needs related to chronic illness (CHF) as evidenced by estimated needs  GOAL:   Patient will meet greater than or equal to 90% of their needs  MONITOR:   PO intake, Supplement acceptance, Labs, I & O's  REASON FOR ASSESSMENT:   Malnutrition Screening Tool    ASSESSMENT:   56 y.o. male presented to the ED with nausea, vomiting, and diarrhea for 3 weeks. PMH includes HTN, T2DM, CKD IV, CHF, and A. Fib. Pt admitted with acute renal failure on CKD IV, CHF exacerbation, and acute hypoxic respiratory failure.    Pt sleeping at time of visit and did not wake to RD voice or touch. No family at bedside, unable to obtain nutrition related history.  No intakes recorded within EMR.  Per Nephrology, plan to have another dialysis treatment tomorrow; suspect that pt has progressed to ESRD based on labs values.   Per Meds History, pt is accepting Nepro 66% of the time that it is offered.   Medications reviewed and include: Aranesp, Protonix, Ferric Gluconate, Lasix Labs reviewed:  - BUN 74 - Creatinine 8.56 - Phosphorus 7 - Hgb A1c 4.8%  HD on 1/28 Net UF: 2000 mL Post- Dialysis Weight: 71 kg  UOP: 600 mL + 1 unmeasured occurrence x 24 hr  NUTRITION - FOCUSED PHYSICAL EXAM:  Deferred to follow-up.   Diet Order:   Diet Order             Diet renal with fluid restriction Fluid restriction: 1200 mL Fluid; Room service appropriate? Yes; Fluid consistency: Thin  Diet effective now                   EDUCATION NEEDS:   No education needs have been identified at this time  Skin:  Skin Assessment: Reviewed RN Assessment  Last BM:  1/29  Height:   Ht Readings from Last 1 Encounters:  11/03/21  5\' 5"  (1.651 m)    Weight:   Wt Readings from Last 1 Encounters:  11/05/21 71.8 kg    Ideal Body Weight:  61.8 kg  BMI:  Body mass index is 26.34 kg/m.  Estimated Nutritional Needs:   Kcal:  1900-2100  Protein:  95-110 grams  Fluid:  UOP + 1L    Allen Mathis Louie Casa, RD, LDN Clinical Dietitian See [facility] for contact information.

## 2021-11-05 NOTE — Progress Notes (Signed)
PROGRESS NOTE    Allen Mathis  RFX:[id number] DOB: April 24, 1966 DOA: 11/01/2021 PCP: Lyman Bishop, DO    Chief Complaint  Patient presents with   Abnormal Lab    Brief Narrative:    Allen Mathis is a 56 y.o. male with medical history significant for HTN, HLD, Type 2 DM, CKD stage 4 who presents with nausea, vomiting, diarrhea, in ED his work-up significant for ascending creatinine at 14, anemia with hemoglobin of 7.3, with Hemoccult positive stool, significantly dyspneic, hypoxic, requiring 6 L nasal cannula initially, he did require to go on BiPAP overnight as well. -Patient had tunneled catheter inserted 1/27, he was dialyzed the same day, dialyzed 2 days back-to-back, had significant improvement of his dyspnea,    Assessment & Plan:   Principal Problem:   Acute renal failure (ARF) ([place]) Active Problems:   Acute respiratory failure with hypoxia ([place])   CHF exacerbation (H[facility])   Atrial fibrillation with RVR (H[facility])   Anemia   High anion gap metabolic acidosis   Type 2 diabetes mellitus with hyperlipidemia ([place])   HTN (hypertension)     Acute renal failure w/hx of CKD stage IV -Creatinine of 14 from a prior of 3.78 back in June -Management per renal, this is likely progression of his CKD, patient will need to start hemodialysis, especially in the setting of volume overload and poor response to Lasix and respiratory distress. -Dialyzed 2 days back-to-back initially, volume status improving, as well responding to Lasix, HD tomorrow.    Acute hypoxic respiratory failure -improving, required BiPAP for short period overnight, wean oxygen as tolerated - resolved   Acute combined systolic/diastolic CHF -CXR obtained showing cardiomegaly with bilateral pleural effusion. BNP of 933 -2D echo with low EF 45 to 50% -Continue with IV Lasix. -Volume management with HD. -Started on beta-blockers, -  No ACE/ARB/Entresto due to his renal failure   New onset atrial  fibrillation with RVR -Back into A. fib with RVR overnight, started on Toprol-XL,  he is having some intermittent A. fib with RVR episode so added as needed metoprolol.  -CHA2DS2-VASc score of 3 , cardiology input greatly appreciated, currently converted back to normal sinus rhythm, at this point risk of bleed outweigh the benefit.  Symptomatic anemia -Hemoglobin of 7.3 down from 12.7 since June 2022 -Received 1 unit PRBC, it is improved to 8 today, on Aranesp and IV iron by renal. -suspect anemia of CKD and also possible GI source with positive FOBT -GI input greatly appreciated, timing for EGD/colonoscopy per GI -Started on Protonix. -Hemoglobin 7.7 this morning, will transfuse 1 unit PRBC.  Anion gap Metabolic acidosis - secondary to Acute renal failure. Monitor after dialysis.    Type 2 diabetes -Controlled with A1c of 4.8.  Hypertension -improving now on metoprolol, continue with Norvasc, as well should continue to improve with dialysis.    Hyperlipidemia Continue statin   DVT prophylaxis: SCD  Code Status: Full Family Communication: None at bedside Disposition:   Status is: Inpatient  Remains inpatient appropriate because: Will need dialysis, volume overloaded, and respiratory distress       Consultants:  IR  renal GI  Audiology  Subjective: Dyspnea, no chest pain, no dizziness or lightheadedness. Objective: Vitals:   11/05/21 0900 11/05/21 0959 11/05/21 1135 11/05/21 1319  BP: (!) 153/88 (!) 142/82 123/83 (!) 153/89  Pulse: 70 61 67 71  Resp: 19 18 20 18   Temp: 97.7 F (36.5 C) 97.8 F (36.6 C) 97.8 F (36.6 C) 97.8 F (  36.6 C)  TempSrc: Oral Oral Oral Oral  SpO2: 99% 99% 97%   Weight:      Height:        Intake/Output Summary (Last 24 hours) at 11/05/2021 1416 Last data filed at 11/05/2021 1319 Gross per 24 hour  Intake 571.67 ml  Output 700 ml  Net -128.33 ml   Filed Weights   11/03/21 1655 11/04/21 0500 11/05/21 0443  Weight: 71 kg 74.2  kg 71.8 kg    Examination:   Awake Alert, Oriented X 3, No new F.N deficits, Normal affect Symmetrical Chest wall movement, Good air movement bilaterally, CTAB RRR,No Gallops,Rubs or new Murmurs, No Parasternal Heave +ve B.Sounds, Abd Soft, No tenderness, No rebound - guarding or rigidity. No Cyanosis, Clubbing or edema, No new Rash or bruise        Data Reviewed: I have personally reviewed following labs and imaging studies  CBC: Recent Labs  Lab 11/01/21 1307 11/02/21 0652 11/03/21 0055 11/04/21 0245 11/05/21 0032  WBC 7.6 8.1 6.3 6.5 7.3  NEUTROABS 6.3  --   --   --   --   HGB 7.3* 7.5* 8.0* 8.0* 7.7*  HCT 20.8* 21.4* 22.5* 21.9* 22.2*  MCV 85.2 83.6 82.7 83.3 85.4  PLT 127* 126* 113* 120* 125*    Basic Metabolic Panel: Recent Labs  Lab 11/01/21 1307 11/02/21 0652 11/03/21 0055 11/04/21 0245 11/05/21 0032  NA 136 137 139 137 137  K 4.4 4.7 3.9 3.6 4.2  CL 98 101 101 99 100  CO2 18* 14* 23 24 23   GLUCOSE 113* 106* 99 118* 125*  BUN 140* 145* 91* 55* 74*  CREATININE 14.18* 15.06* 10.12* 6.95* 8.56*  CALCIUM 7.7* 8.1* 8.1* 8.0* 8.0*  PHOS  --  11.1* 7.6* 5.8* 7.0*    GFR: Estimated Creatinine Clearance: 8.3 mL/min (A) (by C-G formula based on SCr of 8.56 mg/dL (H)).  Liver Function Tests: Recent Labs  Lab 11/01/21 1307 11/03/21 0055 11/04/21 0245 11/05/21 0032  AST 13*  --   --   --   ALT 21  --   --   --   ALKPHOS 67  --   --   --   BILITOT 0.7  --   --   --   PROT 6.8  --   --   --   ALBUMIN 3.9 3.1* 2.8* 2.9*    CBG: No results for input(s): GLUCAP in the last 168 hours.   Recent Results (from the past 240 hour(s))  Urine Culture     Status: None   Collection Time: 11/01/21  1:08 PM   Specimen: Urine, Clean Catch  Result Value Ref Range Status   Specimen Description   Final    URINE, CLEAN CATCH Performed at [facility], [place] [address] [place], [place] [address]    Special Requests   Final    NONE Performed  at [facility], [place] [address] [place], [place] [address]    Culture   Final    NO GROWTH Performed at [facility], [place] [address] [place], [place] [address]    Report Status 11/02/2021 FINAL  Final  Resp Panel by RT-PCR (Flu A&B, Covid) Nasopharyngeal Swab     Status: None   Collection Time: 11/01/21  5:55 PM   Specimen: Nasopharyngeal Swab; Nasopharyngeal(NP) swabs in vial transport medium  Result Value Ref Range Status   SARS Coronavirus 2 by RT PCR NEGATIVE NEGATIVE Final  Comment: (NOTE) SARS-CoV-2 target nucleic acids are NOT DETECTED.  The SARS-CoV-2 RNA is generally detectable in upper respiratory specimens during the acute phase of infection. The lowest concentration of SARS-CoV-2 viral copies this assay can detect is 138 copies/mL. A negative result does not preclude SARS-Cov-2 infection and should not be used as the sole basis for treatment or other patient management decisions. A negative result may occur with  improper specimen collection/handling, submission of specimen other than nasopharyngeal swab, presence of viral mutation(s) within the areas targeted by this assay, and inadequate number of viral copies(<138 copies/mL). A negative result must be combined with clinical observations, patient history, and epidemiological information. The expected result is Negative.  Fact Sheet for Patients:  EntrepreneurPulse.com.au  Fact Sheet for Healthcare Providers:  IncredibleEmployment.be  This test is no t yet approved or cleared by the [place] FDA and  has been authorized for detection and/or diagnosis of SARS-CoV-2 by FDA under an Emergency Use Authorization (EUA). This EUA will remain  in effect (meaning this test can be used) for the duration of the COVID-19 declaration under Section 564(b)(1) of the Act, 21 U.S.C.section 360bbb-3(b)(1), unless the authorization is terminated  or revoked sooner.        Influenza A by PCR NEGATIVE NEGATIVE Final   Influenza B by PCR NEGATIVE NEGATIVE Final    Comment: (NOTE) The Xpert Xpress SARS-CoV-2/FLU/RSV plus assay is intended as an aid in the diagnosis of influenza from Nasopharyngeal swab specimens and should not be used as a sole basis for treatment. Nasal washings and aspirates are unacceptable for Xpert Xpress SARS-CoV-2/FLU/RSV testing.  Fact Sheet for Patients: EntrepreneurPulse.com.au  Fact Sheet for Healthcare Providers: IncredibleEmployment.be  This test is not yet approved or cleared by the [place] FDA and has been authorized for detection and/or diagnosis of SARS-CoV-2 by FDA under an Emergency Use Authorization (EUA). This EUA will remain in effect (meaning this test can be used) for the duration of the COVID-19 declaration under Section 564(b)(1) of the Act, 21 U.S.C. section 360bbb-3(b)(1), unless the authorization is terminated or revoked.  Performed at [facility], [place] [address] [place],  [address]          Radiology Studies: No results found.      Scheduled Meds:  sodium chloride   Intravenous Once   sodium chloride   Intravenous Once   atorvastatin  20 mg Oral Daily   Chlorhexidine Gluconate Cloth  6 each Topical Q0600   Chlorhexidine Gluconate Cloth  6 each Topical Q0600   [START ON 11/08/2021] darbepoetin (ARANESP) injection - DIALYSIS  200 mcg Intravenous Q Thu-HD   feeding supplement (NEPRO CARB STEADY)  237 mL Oral BID BM   furosemide  120 mg Oral BID   metoprolol succinate  50 mg Oral Daily   pantoprazole  40 mg Oral BID AC   Continuous Infusions:  ferric gluconate (FERRLECIT) IVPB 250 mg (11/05/21 1329)     LOS: 4 days       Phillips Climes, MD [facility]   To contact the attending provider between 7A-7P or the covering provider during after hours 7P-7A, please log into the web site [email or web address] and  access using universal [place] password for that web site. If you do not have the password, please call the hospital operator.  11/05/2021, 2:16 PM   Patient ID: CAILLOU MINUS, male   DOB: November 06, 1965, 56 y.o.   MRN: [id number] Patient ID: ADELBERT GASPARD, male   DOB: Dec 10, 1965,  56 y.o.   MRN: [id number] Patient ID: JABRIEL VANDUYNE, male   DOB: 03/06/1966, 56 y.o.   MRN: [id number] Patient ID: EUDELL MCPHEE, male   DOB: 04/28/1966, 56 y.o.   MRN: [id number]

## 2021-11-05 NOTE — Progress Notes (Signed)
Subjective: Allen Mathis is a 56 year old white male with multiple medical problems including acute renal failure superimposed on chronic kidney disease admitted to the hospital with  volume overload, uremia and acute hypoxic respiratory failure complicated by brief episode of atrial fibrillation for which has been started on on beta-blockers.  Incidentally he was also found to have a hemoglobin of 7.3 g/dL down from 12.7 g in June 2022 and received 2 units of packed red blood cells along with IV iron and Aranesp.  Found to be FOBT positive. He had incomplete colonoscopy in 2001 after polyps were removed and was advised to come back for repeat exam in 3 months but never responded to multiple request as his insurance did not cover the repeat procedure. He has dialysis twice since admission and feels much better. He denies having any abdominal pain nausea vomiting melena hematochezia.  He is due for dialysis again tomorrow.  Objective: Vital signs in last 24 hours: Temp:  [97.7 F (36.5 C)-98.2 F (36.8 C)] 97.8 F (36.6 C) (01/30 1319) Pulse Rate:  [61-85] 71 (01/30 1319) Resp:  [10-20] 18 (01/30 1319) BP: (123-168)/(76-95) 153/89 (01/30 1319) SpO2:  [90 %-99 %] 97 % (01/30 1135) Weight:  [71.8 kg] 71.8 kg (01/30 0443) Last BM Date: 11/04/21  Intake/Output from previous day: 01/29 0701 - 01/30 0700 In: 190 [P.O.:120; IV Piggyback:70] Out: 600 [Urine:600] Intake/Output this shift: Total I/O In: 381.7 [Blood:381.7] Out: 100 [Urine:100]  General appearance: alert, cooperative, appears stated age, no distress, and pale Resp: clear to auscultation bilaterally and normal percussion bilaterally Cardio: regular rate and rhythm, S1, S2 normal, no murmur, click, rub or gallop GI: soft, non-tender; bowel sounds normal; no masses,  no organomegaly  Lab Results: Recent Labs    11/03/21 0055 11/04/21 0245 11/05/21 0032  WBC 6.3 6.5 7.3  HGB 8.0* 8.0* 7.7*  HCT 22.5* 21.9* 22.2*  PLT  113* 120* 125*   BMET Recent Labs    11/03/21 0055 11/04/21 0245 11/05/21 0032  NA 139 137 137  K 3.9 3.6 4.2  CL 101 99 100  CO2 23 24 23   GLUCOSE 99 118* 125*  BUN 91* 55* 74*  CREATININE 10.12* 6.95* 8.56*  CALCIUM 8.1* 8.0* 8.0*   LFT Recent Labs    11/05/21 0032  ALBUMIN 2.9*   Studies/Results: No results found.  Medications: I have reviewed the patient's current medications. Prior to Admission:  Medications Prior to Admission  Medication Sig Dispense Refill Last Dose   amLODipine (NORVASC) 5 MG tablet Take 1 tablet by mouth daily.   Past Week   aspirin 81 MG EC tablet Take by mouth.   Past Week   atorvastatin (LIPITOR) 20 MG tablet Take 20 mg by mouth daily.   Past Week   omega-3 acid ethyl esters (LOVAZA) 1 g capsule Take 1 g by mouth daily.   Past Week   TRULICITY 6.94 WN/4.6EV SOPN Inject 0.75 mg into the skin once a week.   10/09/2021   vitamin B-12 (CYANOCOBALAMIN) 100 MCG tablet Take 100 mcg by mouth daily.   Past Week   Scheduled:  sodium chloride   Intravenous Once   sodium chloride   Intravenous Once   atorvastatin  20 mg Oral Daily   Chlorhexidine Gluconate Cloth  6 each Topical Q0600   Chlorhexidine Gluconate Cloth  6 each Topical Q0600   [START ON 11/08/2021] darbepoetin (ARANESP) injection - DIALYSIS  200 mcg Intravenous Q Thu-HD   feeding supplement (NEPRO CARB STEADY)  237 mL Oral BID BM   furosemide  120 mg Oral BID   metoprolol succinate  50 mg Oral Daily   multivitamin  1 tablet Oral QHS   pantoprazole  40 mg Oral BID AC   Continuous:  ferric gluconate (FERRLECIT) IVPB 250 mg (11/05/21 1329)   Assessment/Plan: 1) Symptomatic anemia with positive FOBT awaiting EGD and colonoscopy. He has received 2 units of packed red blood cells and is on IV iron and Aranesp. Also on Protonix. Will plan to do an EGD/Colonoscopy on Wednesday Or Thursday this week.Marland Kitchen 2) Acute renal failure superimposed on chronic stage IV kidney disease-on hemodialysis now. 2)  Acute hypoxic respiratory failure-improved. 3) New onset atrial fibrillation with RVR on Toprol-XL. 4) HTN. 5) Hyperlipidemia. 6) AODM. 7) Acute combined systolic/diastolic CHF. 8) Anion gap metabolic acidosis.  LOS: 4 days   Allen Mathis 11/05/2021, 1:26 PM

## 2021-11-05 NOTE — Progress Notes (Signed)
RT note. Patient currently on 2L [place] sat 99% with no labored breathing noted at this time. Bipap not required. RT will continue to monitor.

## 2021-11-05 NOTE — Consult Note (Addendum)
Cardiology Consultation:   Patient ID: Allen Mathis MRN: [id number]; DOB: July 03, 1966  Admit date: 11/01/2021 Date of Consult: 11/05/2021  PCP:  Allen Bishop, DO   [place] Providers Cardiologist:  None New Dr. Marlou Porch  Patient Profile:   Allen Mathis is a 56 y.o. male with a hx of hypertension, diabetes mellitus, hyperlipidemia, prior tobacco and alcohol abuse and CKD stage IV who is being seen 11/05/2021 for the evaluation of atrial fibrillation with rapid ventricular rate at the request of Dr. Landis Gandy.  No prior cardiac history.  At least 30-pack-year tobacco smoking history, quit about 10 years ago.  He was a daily alcohol drinker but quit few years ago.  History of Present Illness:   Mr. Allen Mathis presented January 26 with nausea, vomiting and diarrhea.  Work-up revealed AKI with creatinine of 14 from baseline of 3.5 and anemia with hemoglobin of 7.3.  Stool guaiac was positive.  Patient was hypoxic on arrival requiring BiPAP due to volume overload.  Patient underwent tunnel catheter placement and started on dialysis.  He was also getting Lasix drip with minimal urine output.  He was given 1 unit of packed red blood per initially with improved hemoglobin to 8.  Given hemoglobin of 7.7 today he again started on 1 pack of red blood cell.  Echocardiogram showed low normal LV function at 45 to 50% without wall motion abnormality.  Patient has intermittent episode of atrial fibrillation with rapid ventricular rate.  He is on Toprol-XL 50 mg daily and getting IV Lopressor 5 mg for breakthrough elevated heart rate.  Cardiology is asked for further evaluation.  He has pending EGD/colonoscopy per GI.  On PPI.   History reviewed. No pertinent past medical history.  Past Surgical History:  Procedure Laterality Date   IR FLUORO GUIDE CV LINE RIGHT  11/02/2021     Inpatient Medications: Scheduled Meds:  sodium chloride   Intravenous Once   sodium chloride   Intravenous Once    atorvastatin  20 mg Oral Daily   Chlorhexidine Gluconate Cloth  6 each Topical Q0600   Chlorhexidine Gluconate Cloth  6 each Topical Q0600   [START ON 11/08/2021] darbepoetin (ARANESP) injection - DIALYSIS  200 mcg Intravenous Q Thu-HD   feeding supplement (NEPRO CARB STEADY)  237 mL Oral BID BM   furosemide  120 mg Oral BID   metoprolol succinate  50 mg Oral Daily   pantoprazole  40 mg Oral BID AC   Continuous Infusions:  ferric gluconate (FERRLECIT) IVPB 250 mg (11/05/21 1329)   PRN Meds: metoprolol tartrate  Allergies:   No Known Allergies  Social History:   Social History   Socioeconomic History   Marital status: Single    Spouse name: Not on file   Number of children: Not on file   Years of education: Not on file   Highest education level: Not on file  Occupational History   Not on file  Tobacco Use   Smoking status: Former    Types: Cigarettes   Smokeless tobacco: Current    Types: Snuff  Vaping Use   Vaping Use: Never used  Substance and Sexual Activity   Alcohol use: Yes   Drug use: Yes    Types: Marijuana   Sexual activity: Not on file  Other Topics Concern   Not on file  Social History Narrative   Not on file   Social Determinants of Health   Financial Resource Strain: Not on file  Food Insecurity: Not on  file  Transportation Needs: Not on file  Physical Activity: Not on file  Stress: Not on file  Social Connections: Not on file  Intimate Partner Violence: Not on file    Family History:   Family History  Problem Relation Age of Onset   Diabetes Mother    Diabetes Father      ROS:  Please see the history of present illness.  All other ROS reviewed and negative.     Physical Exam/Data:   Vitals:   11/05/21 0900 11/05/21 0959 11/05/21 1135 11/05/21 1319  BP: (!) 153/88 (!) 142/82 123/83 (!) 153/89  Pulse: 70 61 67 71  Resp: 19 18 20 18   Temp: 97.7 F (36.5 C) 97.8 F (36.6 C) 97.8 F (36.6 C) 97.8 F (36.6 C)  TempSrc: Oral Oral Oral  Oral  SpO2: 99% 99% 97%   Weight:      Height:        Intake/Output Summary (Last 24 hours) at 11/05/2021 1340 Last data filed at 11/05/2021 1319 Gross per 24 hour  Intake 571.67 ml  Output 700 ml  Net -128.33 ml   Last 3 Weights 11/05/2021 11/04/2021 11/03/2021  Weight (lbs) 158 lb 4.6 oz 163 lb 9.3 oz 156 lb 8.4 oz  Weight (kg) 71.8 kg 74.2 kg 71 kg     Body mass index is 26.34 kg/m.  General:  Well nourished, well developed, in no acute distress HEENT: normal Neck: no JVD Vascular: No carotid bruits; Distal pulses 2+ bilaterally Cardiac:  normal S1, S2; RRR; no murmur  Lungs:  clear to auscultation bilaterally, no wheezing, rhonchi or rales  Abd: soft, nontender, no hepatomegaly  Ext: no edema Musculoskeletal:  No deformities, BUE and BLE strength normal and equal Skin: warm and dry  Neuro:  CNs 2-12 intact, no focal abnormalities noted Psych:  Normal affect   EKG:  The EKG was personally reviewed and demonstrates: Atrial fibrillation at rapid ventricular rate Telemetry:  Telemetry was personally reviewed and demonstrates: Sinus rhythm at controlled rate, intermittent atrial fibrillation  Relevant CV Studies:  Echo 11/02/21  1. Left ventricular ejection fraction, by estimation, is 45 to 50%. The  left ventricle has mildly decreased function. The left ventricle has no  regional wall motion abnormalities. Left ventricular diastolic parameters  are indeterminate. Elevated left  ventricular end-diastolic pressure.   2. Right ventricular systolic function is normal. The right ventricular  size is normal.   3. The mitral valve is normal in structure. Mild mitral valve  regurgitation. No evidence of mitral stenosis.   4. The aortic valve is normal in structure. Aortic valve regurgitation is  not visualized. No aortic stenosis is present.   5. The inferior vena cava is normal in size with greater than 50%  respiratory variability, suggesting right atrial pressure of 3 mmHg.    Laboratory Data:  Chemistry Recent Labs  Lab 11/03/21 0055 11/04/21 0245 11/05/21 0032  NA 139 137 137  K 3.9 3.6 4.2  CL 101 99 100  CO2 23 24 23   GLUCOSE 99 118* 125*  BUN 91* 55* 74*  CREATININE 10.12* 6.95* 8.56*  CALCIUM 8.1* 8.0* 8.0*  GFRNONAA 5* 9* 7*  ANIONGAP 15 14 14     Recent Labs  Lab 11/01/21 1307 11/03/21 0055 11/04/21 0245 11/05/21 0032  PROT 6.8  --   --   --   ALBUMIN 3.9 3.1* 2.8* 2.9*  AST 13*  --   --   --   ALT 21  --   --   --  ALKPHOS 67  --   --   --   BILITOT 0.7  --   --   --    Hematology Recent Labs  Lab 11/03/21 0055 11/04/21 0245 11/05/21 0032  WBC 6.3 6.5 7.3  RBC 2.72* 2.63* 2.60*  HGB 8.0* 8.0* 7.7*  HCT 22.5* 21.9* 22.2*  MCV 82.7 83.3 85.4  MCH 29.4 30.4 29.6  MCHC 35.6 36.5* 34.7  RDW 12.9 13.0 13.0  PLT 113* 120* 125*   Thyroid  Recent Labs  Lab 11/02/21 0652  TSH 1.770    BNP Recent Labs  Lab 2021-11-28 1307  BNP 933.7*    Radiology/Studies:  US RENAL  Result Date: 11-28-21 CLINICAL DATA:  Acute renal insufficiency. EXAM: RENAL / URINARY TRACT ULTRASOUND COMPLETE COMPARISON:  Ultrasound dated 02/23/2021. FINDINGS: Right Kidney: Renal measurements: 9.5 x 4.3 x 8.2 cm = volume: 89 mL. The right kidney is atrophic and echogenic. No hydronephrosis or shadowing stone. Left Kidney: Renal measurements: 9.8 x 3.4 x 4.3 cm = volume: 75 mL. The left kidney is atrophic and echogenic. No hydronephrosis or shadowing stone. There is a 2.5 cm inferior pole cyst. Bladder: Appears normal for degree of bladder distention. Other: None. IMPRESSION: Echogenic and atrophic kidneys in keeping with chronic kidney disease. No hydronephrosis or shadowing stone. Electronically Signed   By: Anner Crete M.D.   On: 11/28/21 19:46   IR Fluoro Guide CV Line Right  Result Date: 11/02/2021 INDICATION: 56 year old with end-stage renal disease. Patient needs a catheter for hemodialysis. EXAM: FLUOROSCOPIC AND ULTRASOUND GUIDED  PLACEMENT OF A TUNNELED DIALYSIS CATHETER Physician: Stephan Minister. Anselm Pancoast, MD MEDICATIONS: Ancef 2 g; The antibiotic was administered within an appropriate time interval prior to skin puncture. ANESTHESIA/SEDATION: Moderate (conscious) sedation was employed during this procedure. A total of Versed 0.5mg  and fentanyl 25 mcg was administered intravenously at the order of the provider performing the procedure. Total intra-service moderate sedation time: 22 minutes. Patient's level of consciousness and vital signs were monitored continuously by radiology nurse throughout the procedure under the supervision of the provider performing the procedure. FLUOROSCOPY TIME:  Fluoroscopy Time: 1 minute, 12 seconds, 8 mGy COMPLICATIONS: None immediate. PROCEDURE: The procedure was explained to the patient. The risks and benefits of the procedure were discussed and the patient's questions were addressed. Informed consent was obtained from the patient. The patient was placed supine on the interventional table. Ultrasound confirmed a patent right internal jugular vein. Ultrasound image obtained for documentation. The right neck and chest was prepped and draped in a sterile fashion. Maximal barrier sterile technique was utilized including caps, mask, sterile gowns, sterile gloves, sterile drape, hand hygiene and skin antiseptic. The right neck was anesthetized with 1% lidocaine. A small incision was made with #11 blade scalpel. A 21 gauge needle directed into the right internal jugular vein with ultrasound guidance. A micropuncture dilator set was placed. A 19 cm tip to cuff Palindrome catheter was selected. The skin below the right clavicle was anesthetized and a small incision was made with an #11 blade scalpel. A subcutaneous tunnel was formed to the vein dermatotomy site. The catheter was brought through the tunnel. The vein dermatotomy site was dilated to accommodate a peel-away sheath. The catheter was placed through the peel-away  sheath and directed into the central venous structures. The tip of the catheter was placed at superior cavoatrial junction with fluoroscopy. Fluoroscopic images were obtained for documentation. Both lumens were found to aspirate and flush well. The proper amount of heparin was  flushed in both lumens. The vein dermatotomy site was closed using a single layer of absorbable suture and Dermabond. Gel-Foam was placed in the subcutaneous tract. The catheter was secured to the skin using Prolene suture. IMPRESSION: Successful placement of a right jugular tunneled dialysis catheter using ultrasound and fluoroscopic guidance. Electronically Signed   By: Markus Daft M.D.   On: 11/02/2021 17:08   DG CHEST PORT 1 VIEW  Result Date: 11/01/2021 CLINICAL DATA:  Proteinuria, dyspnea, diabetes mellitus, smoker EXAM: PORTABLE CHEST 1 VIEW COMPARISON:  Portable exam 1951 hours without priors for comparison FINDINGS: Enlargement of cardiac silhouette. Mediastinal contours normal. Patchy BILATERAL pulmonary infiltrates with bibasilar pleural effusions and atelectasis. No pneumothorax or acute osseous findings. IMPRESSION: BILATERAL pulmonary infiltrates with bibasilar pleural effusions atelectasis associated with cardiomegaly. Findings favor pulmonary edema and CHF though infection is not completely excluded. Electronically Signed   By: Lavonia Dana M.D.   On: 11/01/2021 19:59   ECHOCARDIOGRAM COMPLETE  Result Date: 11/02/2021    ECHOCARDIOGRAM REPORT   Patient Name:   JOSUA FERREBEE Date of Exam: 11/02/2021 Medical Rec #:  [id number]        Height:       65.0 in Accession #:    [id number]       Weight:       166.0 lb Date of Birth:  1966-02-16        BSA:          1.827 m Patient Age:    13 years         BP:           150/82 mmHg Patient Gender: M                HR:           92 bpm. Exam Location:  Inpatient Procedure: 2D Echo, Color Doppler, Cardiac Doppler and Intracardiac            Opacification Agent Indications:    I51.7  Cardiomegaly  History:        Patient has no prior history of Echocardiogram examinations.                 Arrythmias:Atrial Fibrillation; Risk Factors:Hypertension and                 Diabetes.  Sonographer:    Raquel Sarna Senior RDCS Referring Phys: [id number] [place]  1. Left ventricular ejection fraction, by estimation, is 45 to 50%. The left ventricle has mildly decreased function. The left ventricle has no regional wall motion abnormalities. Left ventricular diastolic parameters are indeterminate. Elevated left ventricular end-diastolic pressure.  2. Right ventricular systolic function is normal. The right ventricular size is normal.  3. The mitral valve is normal in structure. Mild mitral valve regurgitation. No evidence of mitral stenosis.  4. The aortic valve is normal in structure. Aortic valve regurgitation is not visualized. No aortic stenosis is present.  5. The inferior vena cava is normal in size with greater than 50% respiratory variability, suggesting right atrial pressure of 3 mmHg. FINDINGS  Left Ventricle: Left ventricular ejection fraction, by estimation, is 45 to 50%. The left ventricle has mildly decreased function. The left ventricle has no regional wall motion abnormalities. Definity contrast agent was given IV to delineate the left ventricular endocardial borders. The left ventricular internal cavity size was normal in size. There is no left ventricular hypertrophy. Left ventricular diastolic parameters are indeterminate. Elevated left ventricular end-diastolic pressure. Right  Ventricle: The right ventricular size is normal. No increase in right ventricular wall thickness. Right ventricular systolic function is normal. Left Atrium: Left atrial size was normal in size. Right Atrium: Right atrial size was normal in size. Pericardium: There is no evidence of pericardial effusion. Mitral Valve: The mitral valve is normal in structure. Mild mitral valve regurgitation. No evidence of mitral  valve stenosis. Tricuspid Valve: The tricuspid valve is normal in structure. Tricuspid valve regurgitation is not demonstrated. No evidence of tricuspid stenosis. Aortic Valve: The aortic valve is normal in structure. Aortic valve regurgitation is not visualized. No aortic stenosis is present. Pulmonic Valve: The pulmonic valve was normal in structure. Pulmonic valve regurgitation is not visualized. No evidence of pulmonic stenosis. Aorta: The aortic root is normal in size and structure. Venous: The inferior vena cava is normal in size with greater than 50% respiratory variability, suggesting right atrial pressure of 3 mmHg. IAS/Shunts: No atrial level shunt detected by color flow Doppler.  LEFT VENTRICLE PLAX 2D LVIDd:         5.25 cm   Diastology LVIDs:         4.00 cm   LV e' medial:    4.03 cm/s LV PW:         1.00 cm   LV E/e' medial:  28.5 LV IVS:        0.95 cm   LV e' lateral:   8.38 cm/s LVOT diam:     2.20 cm   LV E/e' lateral: 13.7 LV SV:         73 LV SV Index:   40 LVOT Area:     3.80 cm  RIGHT VENTRICLE RV S prime:     15.20 cm/s TAPSE (M-mode): 2.6 cm LEFT ATRIUM             Index        RIGHT ATRIUM           Index LA diam:        4.60 cm 2.52 cm/m   RA Area:     18.00 cm LA Vol (A2C):   72.7 ml 39.78 ml/m  RA Volume:   49.60 ml  27.14 ml/m LA Vol (A4C):   51.3 ml 28.07 ml/m LA Biplane Vol: 62.8 ml 34.37 ml/m  AORTIC VALVE LVOT Vmax:   97.40 cm/s LVOT Vmean:  73.700 cm/s LVOT VTI:    0.193 m  AORTA Ao Root diam: 3.00 cm MITRAL VALVE MV Area (PHT): 4.93 cm     SHUNTS MV Decel Time: 154 msec     Systemic VTI:  0.19 m MV E velocity: 115.00 cm/s  Systemic Diam: 2.20 cm MV A velocity: 106.00 cm/s MV E/A ratio:  1.08 Fransico Him MD Electronically signed by Fransico Him MD Signature Date/Time: 11/02/2021/12:45:26 PM    Final      Assessment and Plan:   New onset atrial fibrillation with rapid ventricular rate -Patient with brief episode of atrial fibrillation with spontaneous conversion to  sinus rhythm in setting of symptomatic anemia and AKI requiring dialysis. He does reports intermittent palpitation for months.  Only lasting for 10 to 15 minutes. -TSH normal -Echocardiogram showed low normal LV function of 45 to 50%.  No significant valvular abnormality -Titrate beta-blocker.  Consideration includes short-term addition of antiarrhythmic drug.  -Start anticoagulation when okay with GI  2.  LV dysfunction -Echocardiogram with low normal LV function -Minimal urine output on Lasix drip -Volume managed with dialysis -Continue beta-blocker -No  ACE/ARB/Entresto due to renal function  3.  Symptomatic anemia -Getting another unit of packed red blood cell this afternoon -Pending EGD/colonoscopy per GI  4.  Acute on chronic CKD stage IV -Creatinine of 14 on arrival.  Got tunneled catheter placement and started on dialysis. -Management per nephrology  5.  Acute hypoxic respiratory failure requiring BiPAP -Resolved  Dr. Marlou Porch to see.   Risk Assessment/Risk Scores:   CHA2DS2-VASc Score = 3  This indicates a 3.2% annual risk of stroke. The patient's score is based upon: CHF History: 1 HTN History: 1 Diabetes History: 1 Stroke History: 0 Vascular Disease History: 0 Age Score: 0 Gender Score: 0    For questions or updates, please contact [place] Please consult [email or web address] for contact info under    Signed, Leanor Kail, PA  11/05/2021 1:40 PM   Personally seen and examined. Agree with above.  Newly discovered atrial fibrillation with rapid ventricular response episode for about 30 minutes.  Spontaneously converted.  CHA2DS2-VASc score of at least 2 for hypertension and diabetes.  No prior stroke history.  Used to be a daily alcohol drinker but quit a few years ago as well as smoking history.  EF was approximately 50%.  Recommend continued therapy with Toprol-XL 50 mg a day.  Currently in sinus rhythm.  He is not on anticoagulation.  He has severe  anemia with hemoglobin of 7.3 on arrival.  Received transfusion.  At this point risks of further bleeding outweigh the benefit.  Of course if atrial fibrillation were to return or become more lengthy, we would need to revisit this.  Also new start dialysis.  Getting iron infusion now.  Tunnel catheter on exam.  Pleasant alert and oriented x3 no respiratory distress regular rate and rhythm.  We will go ahead and sign off.  No further cardiology work-up at this time.  Please let us know if we can be of further assistance.  Candee Furbish, MD

## 2021-11-06 DIAGNOSIS — N179 Acute kidney failure, unspecified: Secondary | ICD-10-CM | POA: Diagnosis not present

## 2021-11-06 DIAGNOSIS — Z992 Dependence on renal dialysis: Secondary | ICD-10-CM

## 2021-11-06 DIAGNOSIS — K922 Gastrointestinal hemorrhage, unspecified: Secondary | ICD-10-CM

## 2021-11-06 DIAGNOSIS — D500 Iron deficiency anemia secondary to blood loss (chronic): Secondary | ICD-10-CM | POA: Diagnosis not present

## 2021-11-06 DIAGNOSIS — N186 End stage renal disease: Secondary | ICD-10-CM

## 2021-11-06 LAB — RENAL FUNCTION PANEL
Albumin: 3 g/dL — ABNORMAL LOW (ref 3.5–5.0)
Anion gap: 16 — ABNORMAL HIGH (ref 5–15)
BUN: 87 mg/dL — ABNORMAL HIGH (ref 6–20)
CO2: 21 mmol/L — ABNORMAL LOW (ref 22–32)
Calcium: 8.1 mg/dL — ABNORMAL LOW (ref 8.9–10.3)
Chloride: 98 mmol/L (ref 98–111)
Creatinine, Ser: 9.66 mg/dL — ABNORMAL HIGH (ref 0.61–1.24)
GFR, Estimated: 6 mL/min — ABNORMAL LOW (ref >60)
Glucose, Bld: 149 mg/dL — ABNORMAL HIGH (ref 70–99)
Phosphorus: 7.7 mg/dL — ABNORMAL HIGH (ref 2.5–4.6)
Potassium: 3.7 mmol/L (ref 3.5–5.1)
Sodium: 135 mmol/L (ref 135–145)

## 2021-11-06 LAB — TYPE AND SCREEN
ABO/RH(D): A POS
Antibody Screen: NEGATIVE
Unit division: 0
Unit division: 0

## 2021-11-06 LAB — CBC
HCT: 26.2 % — ABNORMAL LOW (ref 39.0–52.0)
Hemoglobin: 9 g/dL — ABNORMAL LOW (ref 13.0–17.0)
MCH: 28.7 pg (ref 26.0–34.0)
MCHC: 34.4 g/dL (ref 30.0–36.0)
MCV: 83.4 fL (ref 80.0–100.0)
Platelets: 134 10*3/uL — ABNORMAL LOW (ref 150–400)
RBC: 3.14 MIL/uL — ABNORMAL LOW (ref 4.22–5.81)
RDW: 13.3 % (ref 11.5–15.5)
WBC: 7.8 10*3/uL (ref 4.0–10.5)
nRBC: 0 % (ref 0.0–0.2)

## 2021-11-06 LAB — BPAM RBC
Blood Product Expiration Date: 202302112359
Blood Product Expiration Date: 202302232359
ISSUE DATE / TIME: 202301271753
ISSUE DATE / TIME: 202301300929
Unit Type and Rh: 6200
Unit Type and Rh: 6200

## 2021-11-06 MED ORDER — DARBEPOETIN ALFA 200 MCG/0.4ML IJ SOSY
200.0000 ug | PREFILLED_SYRINGE | INTRAMUSCULAR | Status: DC
  Administered 2021-11-06: 200 ug via INTRAVENOUS
  Filled 2021-11-06: qty 0.4

## 2021-11-06 NOTE — Progress Notes (Signed)
Pt resting comfortably in chair. VS stable. No BIPAP needed at this time.

## 2021-11-06 NOTE — Consult Note (Addendum)
Hospital Consult  VASCULAR SURGERY ASSESSMENT & PLAN:   END-STAGE RENAL DISEASE: We were consulted for hemodialysis access.  He is right-handed.  His vein map is pending.  He would prefer to have access in the left arm so I have written an order to remove the IV from the left arm.  I have discussed the indications for the procedure and the potential complications with the patient.  He is agreeable to proceed.  I will schedule him for a left AV fistula or AV graft on Friday.  Gae Gallop, MD 12:49 PM   Reason for Consult:  permanent access Requesting Physician:  Dr. Joylene Grapes MRN #:  [id number]  History of Present Illness: This is a 56 y.o. male with HTN, HLD, and CKD now progressed to ESRD who needs permanent dialysis access. He is currently dialyzing via a Right IJ TDC that was placed on 1/27 by IR. He has never been on dialysis before this admission. He denies any prior central lines, PICC line, or access. He does not have a pacemaker. He has no history of upper extremity surgeries and does not have any limitations. 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 He is right hand dominant.   History reviewed. No pertinent past medical history.  Past Surgical History:  Procedure Laterality Date   IR FLUORO GUIDE CV LINE RIGHT  11/02/2021    No Known Allergies  Prior to Admission medications   Medication Sig Start Date End Date Taking? Authorizing Provider  amLODipine (NORVASC) 5 MG tablet Take 1 tablet by mouth daily. 09/05/21  Yes [provider]  aspirin 81 MG EC tablet Take by mouth.   Yes [provider]  atorvastatin (LIPITOR) 20 MG tablet Take 20 mg by mouth daily. 10/30/21  Yes [provider]  omega-3 acid ethyl esters (LOVAZA) 1 g capsule Take 1 g by mouth daily.   Yes [provider]  TRULICITY 7.61 PJ/0.9TO SOPN Inject 0.75 mg into the skin once a week. 09/14/21  Yes [provider]  vitamin B-12 (CYANOCOBALAMIN) 100 MCG  tablet Take 100 mcg by mouth daily.   Yes [provider]    Social History   Socioeconomic History   Marital status: Single    Spouse name: Not on file   Number of children: Not on file   Years of education: Not on file   Highest education level: Not on file  Occupational History   Not on file  Tobacco Use   Smoking status: Former    Types: Cigarettes   Smokeless tobacco: Current    Types: Snuff  Vaping Use   Vaping Use: Never used  Substance and Sexual Activity   Alcohol use: Yes   Drug use: Yes    Types: Marijuana   Sexual activity: Not on file  Other Topics Concern   Not on file  Social History Narrative   Not on file   Social Determinants of Health   Financial Resource Strain: Not on file  Food Insecurity: Not on file  Transportation Needs: Not on file  Physical Activity: Not on file  Stress: Not on file  Social Connections: Not on file  Intimate Partner Violence: Not on file     Family History  Problem Relation Age of Onset   Diabetes Mother    Diabetes Father     ROS: Otherwise negative unless mentioned in HPI  Physical Examination  Vitals:   11/06/21 0930 11/06/21 1000  BP: (!) 163/94 Marland Kitchen)  159/89  Pulse: 74 68  Resp: 18 14  Temp:    SpO2: 98% 98%   Body mass index is 26.74 kg/m.  General:  WDWN in NAD Gait: Not observed HENT: WNL, normocephalic Pulmonary: normal non-labored breathing Cardiac: regular Abdomen:  soft, NT/ND Extremities: without ischemic changes, without Gangrene , without cellulitis; without open wounds; 2+ radial pulses bilaterally, 5/5 grip strength  Musculoskeletal: no muscle wasting or atrophy  Neurologic: A&O X 3;  No focal weakness or paresthesias are detected; speech is fluent/normal Psychiatric:  The pt has Normal affect.  CBC    Component Value Date/Time   WBC 7.8 11/06/2021 0308   RBC 3.14 (L) 11/06/2021 0308   HGB 9.0 (L) 11/06/2021 0308   HCT 26.2 (L) 11/06/2021 0308   PLT 134 (L) 11/06/2021  0308   MCV 83.4 11/06/2021 0308   MCH 28.7 11/06/2021 0308   MCHC 34.4 11/06/2021 0308   RDW 13.3 11/06/2021 0308   LYMPHSABS 0.7 11/01/2021 1307   MONOABS 0.5 11/01/2021 1307   EOSABS 0.1 11/01/2021 1307   BASOSABS 0.0 11/01/2021 1307    BMET    Component Value Date/Time   NA 135 11/06/2021 0308   K 3.7 11/06/2021 0308   CL 98 11/06/2021 0308   CO2 21 (L) 11/06/2021 0308   GLUCOSE 149 (H) 11/06/2021 0308   BUN 87 (H) 11/06/2021 0308   CREATININE 9.66 (H) 11/06/2021 0308   CALCIUM 8.1 (L) 11/06/2021 0308   GFRNONAA 6 (L) 11/06/2021 0308    COAGS: No results found for: INR, PROTIME   Non-Invasive Vascular Imaging:   Bilateral upper extremity vein mapping pending  Statin:  Yes.   Beta Blocker:  No. Aspirin:  Yes.   ACEI:  No. ARB:  No. CCB use:  Yes Other antiplatelets/anticoagulants:  No.    ASSESSMENT/PLAN: This is a 56 y.o. male with CKD now progressed to ESRD who needs permanent dialysis access. He has a functioning right IJ TDC and has been initiated on HD. Bilateral upper extremity vein mapping is pending. He is right hand dominant. Will plan for a left upper extremity AVF vs AVG. Please restrict LUE. Risk, benefits, and alternatives to access surgery were discussed with the patient. On call vascular surgeon, Dr. Scot Dock will see patient later this afternoon to provide further details about timing of surgery.   Karoline Caldwell PA-C [facility] [phone] 11/06/2021  10:23 AM

## 2021-11-06 NOTE — Procedures (Signed)
I was present at this dialysis session. I have reviewed the session itself and made appropriate changes.   Filed Weights   11/05/21 0443 11/06/21 0506 11/06/21 0814  Weight: 71.8 kg 71.7 kg 72.9 kg    Recent Labs  Lab 11/06/21 0308  NA 135  K 3.7  CL 98  CO2 21*  GLUCOSE 149*  BUN 87*  CREATININE 9.66*  CALCIUM 8.1*  PHOS 7.7*    Recent Labs  Lab 11/01/21 1307 11/02/21 0652 11/04/21 0245 11/05/21 0032 11/06/21 0308  WBC 7.6   < > 6.5 7.3 7.8  NEUTROABS 6.3  --   --   --   --   HGB 7.3*   < > 8.0* 7.7* 9.0*  HCT 20.8*   < > 21.9* 22.2* 26.2*  MCV 85.2   < > 83.3 85.4 83.4  PLT 127*   < > 120* 125* 134*   < > = values in this interval not displayed.    Scheduled Meds:  sodium chloride   Intravenous Once   sodium chloride   Intravenous Once   atorvastatin  20 mg Oral Daily   Chlorhexidine Gluconate Cloth  6 each Topical Q0600   Chlorhexidine Gluconate Cloth  6 each Topical Q0600   [START ON 11/08/2021] darbepoetin (ARANESP) injection - DIALYSIS  200 mcg Intravenous Q Thu-HD   feeding supplement (NEPRO CARB STEADY)  237 mL Oral BID BM   furosemide  120 mg Oral BID   metoprolol succinate  50 mg Oral Daily   multivitamin  1 tablet Oral QHS   pantoprazole  40 mg Oral BID AC   Continuous Infusions:  ferric gluconate (FERRLECIT) IVPB 250 mg (11/05/21 1329)   PRN Meds:.metoprolol tartrate   Santiago Bumpers,  MD 11/06/2021, 9:59 AM

## 2021-11-06 NOTE — Progress Notes (Signed)
Subjective: The patient reports feeling well.  He is able to breath well after several sessions of HD.  Objective: Vital signs in last 24 hours: Temp:  [97.8 F (36.6 C)-98.5 F (36.9 C)] 98.2 F (36.8 C) (01/31 0814) Pulse Rate:  [67-75] 68 (01/31 1200) Resp:  [14-21] 16 (01/31 1200) BP: (145-175)/(78-97) 164/95 (01/31 1200) SpO2:  [94 %-100 %] 97 % (01/31 1200) Weight:  [71.7 kg-72.9 kg] 72.9 kg (01/31 0814) Last BM Date: 11/05/21 (per pt report)  Intake/Output from previous day: 01/30 0701 - 01/31 0700 In: 1001.7 [P.O.:620; Blood:381.7] Out: 1050 [Urine:1050] Intake/Output this shift: Total I/O In: 120 [P.O.:120] Out: -   General appearance: alert and no distress GI: soft, non-tender; bowel sounds normal; no masses,  no organomegaly  Lab Results: Recent Labs    11/04/21 0245 11/05/21 0032 11/06/21 0308  WBC 6.5 7.3 7.8  HGB 8.0* 7.7* 9.0*  HCT 21.9* 22.2* 26.2*  PLT 120* 125* 134*   BMET Recent Labs    11/04/21 0245 11/05/21 0032 11/06/21 0308  NA 137 137 135  K 3.6 4.2 3.7  CL 99 100 98  CO2 24 23 21*  GLUCOSE 118* 125* 149*  BUN 55* 74* 87*  CREATININE 6.95* 8.56* 9.66*  CALCIUM 8.0* 8.0* 8.1*   LFT Recent Labs    11/06/21 0308  ALBUMIN 3.0*   PT/INR No results for input(s): LABPROT, INR in the last 72 hours. Hepatitis Panel No results for input(s): HEPBSAG, HCVAB, HEPAIGM, HEPBIGM in the last 72 hours. C-Diff No results for input(s): CDIFFTOX in the last 72 hours. Fecal Lactopherrin No results for input(s): FECLLACTOFRN in the last 72 hours.  Studies/Results: No results found.  Medications: Scheduled:  atorvastatin  20 mg Oral Daily   Chlorhexidine Gluconate Cloth  6 each Topical Q0600   Chlorhexidine Gluconate Cloth  6 each Topical Q0600   darbepoetin (ARANESP) injection - DIALYSIS  200 mcg Intravenous Q Tue-HD   feeding supplement (NEPRO CARB STEADY)  237 mL Oral BID BM   furosemide  120 mg Oral BID   metoprolol succinate  50 mg  Oral Daily   multivitamin  1 tablet Oral QHS   pantoprazole  40 mg Oral BID AC   Continuous:  ferric gluconate (FERRLECIT) IVPB 250 mg (11/06/21 1118)    Assessment/Plan: 1) IDA. 2) Renal failure. 3) Fluid overload.   The patient is improved.  He is saturating in the high 90's off of oxygen.  Plan: 1) EGD/colonoscopy tomorrow with Dr. Collene Mares  LOS: 5 days   Allen Mathis 11/06/2021, 12:18 PM

## 2021-11-06 NOTE — Progress Notes (Signed)
VAST consulted to obtain IV access. Pt currently has no IV meds/fluids or studies ordered. Spoke with Smyth, pt's nurse; educated it is best practice not to place an IV that is not currently needed to decrease infection risk and allow for vein preservation. Further educated if pt's condition changes and IV access is needed emergently, IV team consult should be placed STAT with a comment as to why an emergent IV is needed. Jasmine, pt's nurse verbalized understanding. Also, educated Engineer, site. MD of above information via SecureChat.

## 2021-11-06 NOTE — Progress Notes (Signed)
Subjective: Patient seen on dialysis today.  Worsening kidney function despite decent urine output.  Patient understands that he will continue to require dialysis at this time.  Objective Vital signs in last 24 hours: Vitals:   11/06/21 0814 11/06/21 0824 11/06/21 0900 11/06/21 0930  BP: (!) 157/90 (!) 175/96 (!) 163/91 (!) 163/94  Pulse: 74 75  74  Resp: 15  16 18   Temp: 98.2 F (36.8 C)     TempSrc: Oral     SpO2: 98%   98%  Weight: 72.9 kg     Height:       Weight change: -0.1 kg  Intake/Output Summary (Last 24 hours) at 11/06/2021 0957 Last data filed at 11/06/2021 1610 Gross per 24 hour  Intake 1121.67 ml  Output 1050 ml  Net 71.67 ml    Assessment/ Plan: Pt is a 56 y.o. yo male who was admitted on 11/01/2021 with uremia and volume overload in the setting of worsening renal failure  Assessment/Plan: 1. Renal-  crt of 4   6 mos ago-  small echogenic kidneys on u/s-  we all suspect that this is just progression of ckd and now ESRD-  Had HD 1/27 and 1/28 with improvement in symptoms.  Unfortunately the patient has had persistently poor kidney function with worsening creatinine in between dialysis and likely ESRD at this time.   -Has TDC in place -Consult VVS for AVF/G creation (appreciate help) -Starting CLIP process 2. HTN/vol-volume status and hypertension much improved.  Switch Lasix to 120 mg twice daily oral.  Continue metoprolol 3. Anemia-   most likely due to CKD-   iron stores low- repleting and also gave esa-intermittently required transfusion. Possible GI bleed 4. Secondary hyperparathyroidism- PTH 265-  no meds-  will see what phos does with HD for now. Was 7.7 today. May need binder based on next level   Reesa Chew    Labs: Basic Metabolic Panel: Recent Labs  Lab 11/04/21 0245 11/05/21 0032 11/06/21 0308  NA 137 137 135  K 3.6 4.2 3.7  CL 99 100 98  CO2 24 23 21*  GLUCOSE 118* 125* 149*  BUN 55* 74* 87*  CREATININE 6.95* 8.56* 9.66*  CALCIUM  8.0* 8.0* 8.1*  PHOS 5.8* 7.0* 7.7*   Liver Function Tests: Recent Labs  Lab 11/01/21 1307 11/03/21 0055 11/04/21 0245 11/05/21 0032 11/06/21 0308  AST 13*  --   --   --   --   ALT 21  --   --   --   --   ALKPHOS 67  --   --   --   --   BILITOT 0.7  --   --   --   --   PROT 6.8  --   --   --   --   ALBUMIN 3.9   < > 2.8* 2.9* 3.0*   < > = values in this interval not displayed.   No results for input(s): LIPASE, AMYLASE in the last 168 hours. No results for input(s): AMMONIA in the last 168 hours. CBC: Recent Labs  Lab 11/01/21 1307 11/02/21 0652 11/03/21 0055 11/04/21 0245 11/05/21 0032 11/06/21 0308  WBC 7.6 8.1 6.3 6.5 7.3 7.8  NEUTROABS 6.3  --   --   --   --   --   HGB 7.3* 7.5* 8.0* 8.0* 7.7* 9.0*  HCT 20.8* 21.4* 22.5* 21.9* 22.2* 26.2*  MCV 85.2 83.6 82.7 83.3 85.4 83.4  PLT 127* 126* 113* 120* 125*  134*   Cardiac Enzymes: No results for input(s): CKTOTAL, CKMB, CKMBINDEX, TROPONINI in the last 168 hours. CBG: No results for input(s): GLUCAP in the last 168 hours.  Iron Studies:  No results for input(s): IRON, TIBC, TRANSFERRIN, FERRITIN in the last 72 hours.  Studies/Results: No results found. Medications: Infusions:  ferric gluconate (FERRLECIT) IVPB 250 mg (11/05/21 1329)    Scheduled Medications:  sodium chloride   Intravenous Once   sodium chloride   Intravenous Once   atorvastatin  20 mg Oral Daily   Chlorhexidine Gluconate Cloth  6 each Topical Q0600   Chlorhexidine Gluconate Cloth  6 each Topical Q0600   [START ON 11/08/2021] darbepoetin (ARANESP) injection - DIALYSIS  200 mcg Intravenous Q Thu-HD   feeding supplement (NEPRO CARB STEADY)  237 mL Oral BID BM   furosemide  120 mg Oral BID   metoprolol succinate  50 mg Oral Daily   multivitamin  1 tablet Oral QHS   pantoprazole  40 mg Oral BID AC    have reviewed scheduled and prn medications.  Physical Exam: General: Resting in bed with no apparent distress Heart: RRR Lungs: Bilateral  chest rise with no increased work of breathing Abdomen: soft, non tender Extremities:  edema - improved  Dialysis Access: right sided tunneled HD cath    11/06/2021,9:57 AM  LOS: 5 days

## 2021-11-06 NOTE — Progress Notes (Signed)
Requested to see pt for out-pt HD arrangements. Met with pt at bedside while pt receiving HD this am. Introduced self and explained role. 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 Pt prefers [facility]. Referral made to [facility] admissions today. Pt prefers TTS due to work schedule if possible. Pt plans to transport self to appts. Will follow and assist.   Melven Sartorius Renal Navigator [phone]

## 2021-11-06 NOTE — Progress Notes (Signed)
PROGRESS NOTE    Allen Mathis  YHC:[id number] DOB: [birth year] DOA: 11/01/2021 PCP: Lyman Bishop, DO    Chief Complaint  Patient presents with   Abnormal Lab    Brief Narrative:    Allen Mathis is a 56 y.o. male with medical history significant for HTN, HLD, Type 2 DM, CKD stage 4 who presents with nausea, vomiting, diarrhea, in ED his work-up significant for ascending creatinine at 14, anemia with hemoglobin of 7.3, with Hemoccult positive stool, significantly dyspneic, hypoxic, requiring 6 L nasal cannula initially, he did require to go on BiPAP overnight as well. -Patient had tunneled catheter inserted 1/27, he was dialyzed the same day, dialyzed 2 days back-to-back, had significant improvement of his dyspnea, he is still requiring dialysis, plan for access planned this Friday. -He is with Hemoccult positive, acute blood loss anemia, which appears to have stabilize,plan for EGD/colonoscopy by Dr. Collene Mares tomorrow   Assessment & Plan:   Principal Problem:   Acute renal failure (ARF) ([place]) Active Problems:   Acute respiratory failure with hypoxia ([place])   CHF exacerbation ([place])   Atrial fibrillation with RVR ([place])   Anemia   High anion gap metabolic acidosis   Type 2 diabetes mellitus with hyperlipidemia ([place])   HTN (hypertension)     Acute renal failure w/hx of CKD stage IV -Creatinine of 14 from a prior of 3.78 back in June -Management per renal, this is likely progression of his CKD, patient will need to start hemodialysis, especially in the setting of volume overload and poor response to Lasix and respiratory distress. -Management per renal, likely will need HD long-term  - vascular surgery consulted, plan for left AV fistula or AV graft on Friday  Acute hypoxic respiratory failure -improving, required BiPAP for short period overnight, wean oxygen as tolerated - resolved   Acute combined systolic/diastolic CHF -CXR obtained showing cardiomegaly with  bilateral pleural effusion. BNP of 933 -2D echo with low EF 45 to 50% -Continue with IV Lasix. -Volume management with HD. -Started on beta-blockers, -  No ACE/ARB/Entresto due to his renal failure   New onset atrial fibrillation with RVR -Back into A. fib with RVR overnight, started on Toprol-XL,  he is having some intermittent A. fib with RVR episode so added as needed metoprolol.  -CHA2DS2-VASc score of 3 , cardiology input greatly appreciated, currently converted back to normal sinus rhythm, at this point risk of bleed outweigh the benefit.  Symptomatic anemia -Hemoglobin of 7.3 down from 12.7 since June 2022 - on Aranesp and IV iron by renal. -suspect anemia of CKD and also possible GI source with positive FOBT -plan for  for EGD/colonoscopy tomorrow per GI -Started on Protonix. -received 2 units PRBC so far  Anion gap Metabolic acidosis - secondary to Acute renal failure. Monitor after dialysis.    Type 2 diabetes -Controlled with A1c of 4.8.  Hypertension -improving now on metoprolol, continue with Norvasc, as well should continue to improve with dialysis.    Hyperlipidemia Continue statin   DVT prophylaxis: SCD , he is ambulatory Code Status: Full Family Communication: None at bedside Disposition:   Status is: Inpatient  Remains inpatient appropriate because: Will need dialysis, volume overloaded, and respiratory distress       Consultants:  IR  renal GI  Audiology  Subjective:  Denies any dyspnea, chest pain, or fever.  Objective: Vitals:   11/06/21 1130 11/06/21 1200 11/06/21 1230 11/06/21 1253  BP: (!) 163/94 (!) 164/95 (!) 174/91 (!) 156/76  Pulse: 69 68  75  Resp: 18 16 14 17   Temp:   (!) 97.1 F (36.2 C) 98.2 F (36.8 C)  TempSrc:   Oral Oral  SpO2: 96% 97% 98% 97%  Weight:   71.4 kg   Height:        Intake/Output Summary (Last 24 hours) at 11/06/2021 1335 Last data filed at 11/06/2021 1254 Gross per 24 hour  Intake 340 ml  Output  2494 ml  Net -2154 ml   Filed Weights   11/06/21 0506 11/06/21 0814 11/06/21 1230  Weight: 71.7 kg 72.9 kg 71.4 kg    Examination:   Awake Alert, Oriented X 3, No new F.N deficits, Normal affect Symmetrical Chest wall movement, Good air movement bilaterally, CTAB RRR,No Gallops,Rubs or new Murmurs, No Parasternal Heave +ve B.Sounds, Abd Soft, No tenderness, No rebound - guarding or rigidity. No Cyanosis, Clubbing or edema, No new Rash or bruise      Data Reviewed: I have personally reviewed following labs and imaging studies  CBC: Recent Labs  Lab 11/01/21 1307 11/02/21 0652 11/03/21 0055 11/04/21 0245 11/05/21 0032 11/06/21 0308  WBC 7.6 8.1 6.3 6.5 7.3 7.8  NEUTROABS 6.3  --   --   --   --   --   HGB 7.3* 7.5* 8.0* 8.0* 7.7* 9.0*  HCT 20.8* 21.4* 22.5* 21.9* 22.2* 26.2*  MCV 85.2 83.6 82.7 83.3 85.4 83.4  PLT 127* 126* 113* 120* 125* 134*    Basic Metabolic Panel: Recent Labs  Lab 11/02/21 0652 11/03/21 0055 11/04/21 0245 11/05/21 0032 11/06/21 0308  NA 137 139 137 137 135  K 4.7 3.9 3.6 4.2 3.7  CL 101 101 99 100 98  CO2 14* 23 24 23  21*  GLUCOSE 106* 99 118* 125* 149*  BUN 145* 91* 55* 74* 87*  CREATININE 15.06* 10.12* 6.95* 8.56* 9.66*  CALCIUM 8.1* 8.1* 8.0* 8.0* 8.1*  PHOS 11.1* 7.6* 5.8* 7.0* 7.7*    GFR: Estimated Creatinine Clearance: 7.3 mL/min (A) (by C-G formula based on SCr of 9.66 mg/dL (H)).  Liver Function Tests: Recent Labs  Lab 11/01/21 1307 11/03/21 0055 11/04/21 0245 11/05/21 0032 11/06/21 0308  AST 13*  --   --   --   --   ALT 21  --   --   --   --   ALKPHOS 67  --   --   --   --   BILITOT 0.7  --   --   --   --   PROT 6.8  --   --   --   --   ALBUMIN 3.9 3.1* 2.8* 2.9* 3.0*    CBG: No results for input(s): GLUCAP in the last 168 hours.   Recent Results (from the past 240 hour(s))  Urine Culture     Status: None   Collection Time: 11/01/21  1:08 PM   Specimen: Urine, Clean Catch  Result Value Ref Range Status    Specimen Description   Final    URINE, CLEAN CATCH Performed at [facility], [place] [address] [place], [place] [address]    Special Requests   Final    NONE Performed at [facility], [place] [address], [place], [place] [address]    Culture   Final    NO GROWTH Performed at [facility], [place] [address] [place], [place] [address]    Report Status 11/02/2021 FINAL  Final  Resp Panel by RT-PCR (Flu A&B, Covid)  Nasopharyngeal Swab     Status: None   Collection Time: 11/01/21  5:55 PM   Specimen: Nasopharyngeal Swab; Nasopharyngeal(NP) swabs in vial transport medium  Result Value Ref Range Status   SARS Coronavirus 2 by RT PCR NEGATIVE NEGATIVE Final    Comment: (NOTE) SARS-CoV-2 target nucleic acids are NOT DETECTED.  The SARS-CoV-2 RNA is generally detectable in upper respiratory specimens during the acute phase of infection. The lowest concentration of SARS-CoV-2 viral copies this assay can detect is 138 copies/mL. A negative result does not preclude SARS-Cov-2 infection and should not be used as the sole basis for treatment or other patient management decisions. A negative result may occur with  improper specimen collection/handling, submission of specimen other than nasopharyngeal swab, presence of viral mutation(s) within the areas targeted by this assay, and inadequate number of viral copies(<138 copies/mL). A negative result must be combined with clinical observations, patient history, and epidemiological information. The expected result is Negative.  Fact Sheet for Patients:  EntrepreneurPulse.com.au  Fact Sheet for Healthcare Providers:  IncredibleEmployment.be  This test is no t yet approved or cleared by the [place] FDA and  has been authorized for detection and/or diagnosis of SARS-CoV-2 by FDA under an Emergency Use Authorization (EUA). This EUA will remain  in effect (meaning this  test can be used) for the duration of the COVID-19 declaration under Section 564(b)(1) of the Act, 21 U.S.C.section 360bbb-3(b)(1), unless the authorization is terminated  or revoked sooner.       Influenza A by PCR NEGATIVE NEGATIVE Final   Influenza B by PCR NEGATIVE NEGATIVE Final    Comment: (NOTE) The Xpert Xpress SARS-CoV-2/FLU/RSV plus assay is intended as an aid in the diagnosis of influenza from Nasopharyngeal swab specimens and should not be used as a sole basis for treatment. Nasal washings and aspirates are unacceptable for Xpert Xpress SARS-CoV-2/FLU/RSV testing.  Fact Sheet for Patients: EntrepreneurPulse.com.au  Fact Sheet for Healthcare Providers: IncredibleEmployment.be  This test is not yet approved or cleared by the [place] FDA and has been authorized for detection and/or diagnosis of SARS-CoV-2 by FDA under an Emergency Use Authorization (EUA). This EUA will remain in effect (meaning this test can be used) for the duration of the COVID-19 declaration under Section 564(b)(1) of the Act, 21 U.S.C. section 360bbb-3(b)(1), unless the authorization is terminated or revoked.  Performed at [facility], [place] [address] [place], [place] [address]          Radiology Studies: No results found.      Scheduled Meds:  atorvastatin  20 mg Oral Daily   Chlorhexidine Gluconate Cloth  6 each Topical Q0600   Chlorhexidine Gluconate Cloth  6 each Topical Q0600   darbepoetin (ARANESP) injection - DIALYSIS  200 mcg Intravenous Q Tue-HD   feeding supplement (NEPRO CARB STEADY)  237 mL Oral BID BM   furosemide  120 mg Oral BID   metoprolol succinate  50 mg Oral Daily   multivitamin  1 tablet Oral QHS   pantoprazole  40 mg Oral BID AC   Continuous Infusions:     LOS: 5 days       Phillips Climes, MD [facility]   To contact the attending provider between 7A-7P or the covering  provider during after hours 7P-7A, please log into the web site [email or web address] and access using universal [place] password for that web site. If you do not have the password, please call the hospital operator.  11/06/2021, 1:35 PM  Patient ID: ALAIN DESCHENE, male   DOB: 11/29/1965, 56 y.o.   MRN: [id number] Patient ID: NOLON YELLIN, male   DOB: [birth year], 56 y.o.   MRN: [id number] Patient ID: CORTLANDT CAPUANO, male   DOB: [birth year], 56 y.o.   MRN: [id number] Patient ID: ALANDIS BLUEMEL, male   DOB: September 20, 1966, 56 y.o.   MRN: [id number] Patient ID: JAYD CADIEUX, male   DOB: 1965-11-02, 56 y.o.   MRN: [id number]

## 2021-11-07 ENCOUNTER — Inpatient Hospital Stay (HOSPITAL_COMMUNITY): Payer: BC Managed Care – PPO

## 2021-11-07 DIAGNOSIS — N186 End stage renal disease: Secondary | ICD-10-CM | POA: Diagnosis not present

## 2021-11-07 DIAGNOSIS — E785 Hyperlipidemia, unspecified: Secondary | ICD-10-CM | POA: Diagnosis present

## 2021-11-07 LAB — RENAL FUNCTION PANEL
Albumin: 3.1 g/dL — ABNORMAL LOW (ref 3.5–5.0)
Anion gap: 13 (ref 5–15)
BUN: 33 mg/dL — ABNORMAL HIGH (ref 6–20)
CO2: 25 mmol/L (ref 22–32)
Calcium: 8.6 mg/dL — ABNORMAL LOW (ref 8.9–10.3)
Chloride: 100 mmol/L (ref 98–111)
Creatinine, Ser: 5.61 mg/dL — ABNORMAL HIGH (ref 0.61–1.24)
GFR, Estimated: 11 mL/min — ABNORMAL LOW (ref >60)
Glucose, Bld: 99 mg/dL (ref 70–99)
Phosphorus: 5.1 mg/dL — ABNORMAL HIGH (ref 2.5–4.6)
Potassium: 3.6 mmol/L (ref 3.5–5.1)
Sodium: 138 mmol/L (ref 135–145)

## 2021-11-07 LAB — CBC
HCT: 27.1 % — ABNORMAL LOW (ref 39.0–52.0)
Hemoglobin: 9.4 g/dL — ABNORMAL LOW (ref 13.0–17.0)
MCH: 29.2 pg (ref 26.0–34.0)
MCHC: 34.7 g/dL (ref 30.0–36.0)
MCV: 84.2 fL (ref 80.0–100.0)
Platelets: 128 10*3/uL — ABNORMAL LOW (ref 150–400)
RBC: 3.22 MIL/uL — ABNORMAL LOW (ref 4.22–5.81)
RDW: 13.4 % (ref 11.5–15.5)
WBC: 7.7 10*3/uL (ref 4.0–10.5)
nRBC: 0 % (ref 0.0–0.2)

## 2021-11-07 MED ORDER — PEG 3350-KCL-NA BICARB-NACL 420 G PO SOLR: 4000.0000 mL | Freq: Once | ORAL | Status: DC

## 2021-11-07 MED ORDER — PEG 3350-KCL-NA BICARB-NACL 420 G PO SOLR
4000.0000 mL | Freq: Once | ORAL | Status: AC
  Administered 2021-11-07: 4000 mL via ORAL
  Filled 2021-11-07: qty 4000

## 2021-11-07 NOTE — Assessment & Plan Note (Addendum)
Felt to have progression of underlying CKD-now ESRD-nephrology following and managing HD care.  Vascular surgery placed left radiocephalic AV fistula on 2/3.Marland Kitchen  Clipping procedure is complete-he has been accepted at outpatient HD center-TTS schedule-starting this Saturday.  Discussed with nephrologist-Dr. Peoples today-okay to discharge from his point of view.

## 2021-11-07 NOTE — Progress Notes (Signed)
PROGRESS NOTE        PATIENT DETAILS Name: Allen Mathis Age: 56 y.o. Sex: male Date of Birth: 05-05-1966 Admit Date: 11/01/2021 Admitting Physician Orene Desanctis, DO DJM:[id number], Adele Barthel, DO  Brief Summary: For T71-year-old with history of HTN, DM-2, CKD stage IV who presented with decompensated combined systolic and diastolic heart failure in the setting of AKI.  He was evaluated by nephrology-deemed to have ESRD and started on HD.  He also was found to have severe anemia-requiring PRBC transfusion.  See below for further details.  Significant events: 1/26>> admit to [facility] respiratory failure due to decompensated CHF in the setting of AKI and worsening anemia.  Significant imaging studies: 1/26>> renal ultrasound: No hydronephrosis 1/26>> CXR: Bilateral pulmonary infiltrates-likely pulmonary edema.  Significant microbiology data: 1/26>> COVID/influenza PCR: Negative 1/26>> urine culture: No growth  Subjective: Lying comfortably in bed-denies any chest pain or shortness of breath.  Objective: Vitals: Blood pressure (!) 149/84, pulse 77, temperature 98.4 F (36.9 C), temperature source Oral, resp. rate 20, height 5\' 5"  (1.651 m), weight 69.9 kg, SpO2 97 %.   Exam: Gen Exam:Alert awake-not in any distress HEENT:atraumatic, normocephalic Chest: B/L clear to auscultation anteriorly CVS:S1S2 regular Abdomen:soft non tender, non distended Extremities:no edema Neurology: Non focal Skin: no rash  Pertinent Labs/Radiology: CBC Latest Ref Rng & Units 11/07/2021 11/06/2021 11/05/2021  WBC 4.0 - 10.5 K/uL 7.7 7.8 7.3  Hemoglobin 13.0 - 17.0 g/dL 9.4(L) 9.0(L) 7.7(L)  Hematocrit 39.0 - 52.0 % 27.1(L) 26.2(L) 22.2(L)  Platelets 150 - 400 K/uL 128(L) 134(L) 125(L)    Lab Results  Component Value Date   NA 138 11/07/2021   K 3.6 11/07/2021   CL 100 11/07/2021   CO2 25 11/07/2021      Assessment/Plan: * Acute respiratory failure with hypoxia due to  decompensated combined systolic/diastolic heart failure in the setting of worsening renal function. Required BiPAP on initial presentation-significant improvement in volume status/hypoxia with hemodialysis-on room air.  Echo on 1/27 with 45-50% EF.  Acute renal failure on CKD stage IV-now ESRD- (present on admission) Felt to have progression of underlying CKD-now ESRD-nephrology following and managing HD care.  Vascular surgery planning left AV fistula/graft on Friday.  Social worker following for HD center clipping  Atrial fibrillation with RVR ([place])- (present on admission) Brief run of AVR again this morning-continue beta-blocker-given concern for GI bleeding-avoiding anticoagulation for now.  Cardiology-if patient developed persistent atrial fibrillation-can revisit issue of anticoagulation.  Multifactorial anemia- (present on admission) Multifactorial anemia due to a combination of CKD-and possible slow GI bleeding (FOBT positive).  Hemoglobin stable-has required PRBC transfusion during this hospitalization.  Nephrology following and managing IV iron/Aranesp.  Gastroenterology planning EGD/colonoscopy tomorrow.  HTN (hypertension)- (present on admission) BP stable-continue metoprolol.  HLD (hyperlipidemia)- (present on admission) Continue statin.  Nutrition Status: Nutrition Problem: Increased nutrient needs Etiology: chronic illness Signs/Symptoms: estimated needs Interventions: MVI, [place] shake  BMI: Estimated body mass index is 25.64 kg/m as calculated from the following:   Height as of this encounter: 5\' 5"  (1.651 m).   Weight as of this encounter: 69.9 kg.    DVT Prophylaxis: SCD's Procedures: None Consults: Nephrology, vascular surgery Code Status:Full code  Family Communication: None at bedside   Disposition Plan: Status is: Inpatient Remains inpatient appropriate because: New ESRD-awaiting clipping-severe anemia-awaiting endoscopic evaluation scheduled for  2/2.  Planned Discharge  Destination: Home    Diet: Diet Order             Diet NPO time specified  Diet effective midnight                     Antimicrobial agents: Anti-infectives (From admission, onward)    Start     Dose/Rate Route Frequency Ordered Stop   11/02/21 1519  ceFAZolin (ANCEF) IVPB 2g/100 mL premix        over 30 Minutes Intravenous Continuous PRN 11/02/21 1520 11/02/21 1519        MEDICATIONS: Scheduled Meds:  atorvastatin  20 mg Oral Daily   Chlorhexidine Gluconate Cloth  6 each Topical Q0600   Chlorhexidine Gluconate Cloth  6 each Topical Q0600   darbepoetin (ARANESP) injection - DIALYSIS  200 mcg Intravenous Q Tue-HD   feeding supplement (NEPRO CARB STEADY)  237 mL Oral BID BM   metoprolol succinate  50 mg Oral Daily   multivitamin  1 tablet Oral QHS   pantoprazole  40 mg Oral BID AC   Continuous Infusions: PRN Meds:.metoprolol tartrate   I have personally reviewed following labs and imaging studies  LABORATORY DATA: CBC: Recent Labs  Lab 11/01/21 1307 11/02/21 0652 11/03/21 0055 11/04/21 0245 11/05/21 0032 11/06/21 0308 11/07/21 0229  WBC 7.6   < > 6.3 6.5 7.3 7.8 7.7  NEUTROABS 6.3  --   --   --   --   --   --   HGB 7.3*   < > 8.0* 8.0* 7.7* 9.0* 9.4*  HCT 20.8*   < > 22.5* 21.9* 22.2* 26.2* 27.1*  MCV 85.2   < > 82.7 83.3 85.4 83.4 84.2  PLT 127*   < > 113* 120* 125* 134* 128*   < > = values in this interval not displayed.    Basic Metabolic Panel: Recent Labs  Lab 11/03/21 0055 11/04/21 0245 11/05/21 0032 11/06/21 0308 11/07/21 0229  NA 139 137 137 135 138  K 3.9 3.6 4.2 3.7 3.6  CL 101 99 100 98 100  CO2 23 24 23  21* 25  GLUCOSE 99 118* 125* 149* 99  BUN 91* 55* 74* 87* 33*  CREATININE 10.12* 6.95* 8.56* 9.66* 5.61*  CALCIUM 8.1* 8.0* 8.0* 8.1* 8.6*  PHOS 7.6* 5.8* 7.0* 7.7* 5.1*    GFR: Estimated Creatinine Clearance: 12.6 mL/min (A) (by C-G formula based on SCr of 5.61 mg/dL (H)).  Liver Function  Tests: Recent Labs  Lab 11/01/21 1307 11/03/21 0055 11/04/21 0245 11/05/21 0032 11/06/21 0308 11/07/21 0229  AST 13*  --   --   --   --   --   ALT 21  --   --   --   --   --   ALKPHOS 67  --   --   --   --   --   BILITOT 0.7  --   --   --   --   --   PROT 6.8  --   --   --   --   --   ALBUMIN 3.9 3.1* 2.8* 2.9* 3.0* 3.1*   No results for input(s): LIPASE, AMYLASE in the last 168 hours. No results for input(s): AMMONIA in the last 168 hours.  Coagulation Profile: No results for input(s): INR, PROTIME in the last 168 hours.  Cardiac Enzymes: No results for input(s): CKTOTAL, CKMB, CKMBINDEX, TROPONINI in the last 168 hours.  BNP (last 3 results) No results for input(s):  PROBNP in the last 8760 hours.  Lipid Profile: No results for input(s): CHOL, HDL, LDLCALC, TRIG, CHOLHDL, LDLDIRECT in the last 72 hours.  Thyroid Function Tests: No results for input(s): TSH, T4TOTAL, FREET4, T3FREE, THYROIDAB in the last 72 hours.  Anemia Panel: No results for input(s): VITAMINB12, FOLATE, FERRITIN, TIBC, IRON, RETICCTPCT in the last 72 hours.  Urine analysis:    Component Value Date/Time   COLORURINE STRAW (A) 11/01/2021 1307   APPEARANCEUR CLEAR 11/01/2021 1307   LABSPEC 1.010 11/01/2021 1307   PHURINE 5.0 11/01/2021 1307   GLUCOSEU 150 (A) 11/01/2021 1307   HGBUR MODERATE (A) 11/01/2021 1307   BILIRUBINUR NEGATIVE 11/01/2021 1307   KETONESUR 5 (A) 11/01/2021 1307   PROTEINUR >=300 (A) 11/01/2021 1307   NITRITE NEGATIVE 11/01/2021 1307   LEUKOCYTESUR NEGATIVE 11/01/2021 1307    Sepsis Labs: Lactic Acid, Venous No results found for: LATICACIDVEN  MICROBIOLOGY: Recent Results (from the past 240 hour(s))  Urine Culture     Status: None   Collection Time: 11/01/21  1:08 PM   Specimen: Urine, Clean Catch  Result Value Ref Range Status   Specimen Description   Final    URINE, CLEAN CATCH Performed at [facility], [place] [address], [place], [place]  [address]    Special Requests   Final    NONE Performed at [facility], [place] [address], [place], [place] [address]    Culture   Final    NO GROWTH Performed at [facility], [place] [address] [place], [place] [address]    Report Status 11/02/2021 FINAL  Final  Resp Panel by RT-PCR (Flu A&B, Covid) Nasopharyngeal Swab     Status: None   Collection Time: 11/01/21  5:55 PM   Specimen: Nasopharyngeal Swab; Nasopharyngeal(NP) swabs in vial transport medium  Result Value Ref Range Status   SARS Coronavirus 2 by RT PCR NEGATIVE NEGATIVE Final    Comment: (NOTE) SARS-CoV-2 target nucleic acids are NOT DETECTED.  The SARS-CoV-2 RNA is generally detectable in upper respiratory specimens during the acute phase of infection. The lowest concentration of SARS-CoV-2 viral copies this assay can detect is 138 copies/mL. A negative result does not preclude SARS-Cov-2 infection and should not be used as the sole basis for treatment or other patient management decisions. A negative result may occur with  improper specimen collection/handling, submission of specimen other than nasopharyngeal swab, presence of viral mutation(s) within the areas targeted by this assay, and inadequate number of viral copies(<138 copies/mL). A negative result must be combined with clinical observations, patient history, and epidemiological information. The expected result is Negative.  Fact Sheet for Patients:  EntrepreneurPulse.com.au  Fact Sheet for Healthcare Providers:  IncredibleEmployment.be  This test is no t yet approved or cleared by the [place] FDA and  has been authorized for detection and/or diagnosis of SARS-CoV-2 by FDA under an Emergency Use Authorization (EUA). This EUA will remain  in effect (meaning this test can be used) for the duration of the COVID-19 declaration under Section 564(b)(1) of the Act, 21 U.S.C.section 360bbb-3(b)(1),  unless the authorization is terminated  or revoked sooner.       Influenza A by PCR NEGATIVE NEGATIVE Final   Influenza B by PCR NEGATIVE NEGATIVE Final    Comment: (NOTE) The Xpert Xpress SARS-CoV-2/FLU/RSV plus assay is intended as an aid in the diagnosis of influenza from Nasopharyngeal swab specimens and should not be used as a sole basis for treatment. Nasal washings and aspirates  are unacceptable for Xpert Xpress SARS-CoV-2/FLU/RSV testing.  Fact Sheet for Patients: EntrepreneurPulse.com.au  Fact Sheet for Healthcare Providers: IncredibleEmployment.be  This test is not yet approved or cleared by the [place] FDA and has been authorized for detection and/or diagnosis of SARS-CoV-2 by FDA under an Emergency Use Authorization (EUA). This EUA will remain in effect (meaning this test can be used) for the duration of the COVID-19 declaration under Section 564(b)(1) of the Act, 21 U.S.C. section 360bbb-3(b)(1), unless the authorization is terminated or revoked.  Performed at [facility], [place] [address], [place], [place] [address]     RADIOLOGY STUDIES/RESULTS: No results found.   LOS: 6 days   Oren Binet, MD  [facility]    To contact the attending provider between 7A-7P or the covering provider during after hours 7P-7A, please log into the web site [email or web address] and access using universal [place] password for that web site. If you do not have the password, please call the hospital operator.  11/07/2021, 1:04 PM

## 2021-11-07 NOTE — Assessment & Plan Note (Addendum)
Required BiPAP on initial presentation-significant improvement in volume status/hypoxia with hemodialysis-he is now stable on room air. .  Echo on 1/27 with 45-50% EF.

## 2021-11-07 NOTE — Assessment & Plan Note (Addendum)
Multifactorial anemia due to a combination of CKD-and possible slow GI bleeding (FOBT positive).  Hemoglobin stable-has required PRBC transfusion during this hospitalization.  Nephrology followed closely and managed iron/Aranesp..  Gastroenterology consulted-underwent EGD/colonoscopy which did not show any bleeding foci.

## 2021-11-07 NOTE — Plan of Care (Signed)

## 2021-11-07 NOTE — Assessment & Plan Note (Addendum)
Maintaining sinus rhythm-has had intermittent brief runs of RVR over the past few days-continue beta-blocker-given concern for GI bleeding/severity of anemia-anticoagulation being avoided.  Per cardiology-if patient developed persistent atrial fibrillation-can revisit issue of anticoagulation.  Please ensure follow-up with cardiology

## 2021-11-07 NOTE — Hospital Course (Addendum)
For T73-year-old with history of HTN, DM-2, CKD stage IV who presented with decompensated combined systolic and diastolic heart failure in the setting of AKI.  He was evaluated by nephrology-deemed to have ESRD and started on HD.  He also was found to have severe anemia-requiring PRBC transfusion.  See below for further details.  Significant events: 1/26>> admit to [facility] respiratory failure due to decompensated CHF in the setting of AKI and worsening anemia.  Significant imaging studies: 1/26>> renal ultrasound: No hydronephrosis 1/26>> CXR: Bilateral pulmonary infiltrates-likely pulmonary edema.  Significant microbiology data: 1/26>> COVID/influenza PCR: Negative 1/26>> urine culture: No growth  Procedures: 1/27>> right IJ tunneled HD catheter by IR 2/2>> EGD/colonoscopy: No obvious bleeding foci 6/2>> left radiocephalic AV fistula

## 2021-11-07 NOTE — Progress Notes (Signed)
Subjective: Patient feels well today with no complaints  Objective Vital signs in last 24 hours: Vitals:   11/07/21 0500 11/07/21 0747 11/07/21 0833 11/07/21 1150  BP: (!) 149/76 (!) 141/75 124/82 (!) 149/84  Pulse: 64  98 77  Resp: 20     Temp: 98.2 F (36.8 C)  97.9 F (36.6 C) 98.4 F (36.9 C)  TempSrc: Oral  Oral Oral  SpO2: 99% 98% 97% 97%  Weight: 69.9 kg     Height:       Weight change: 1.2 kg  Intake/Output Summary (Last 24 hours) at 11/07/2021 1223 Last data filed at 11/06/2021 1549 Gross per 24 hour  Intake --  Output 350 ml  Net -350 ml    Assessment/ Plan: Pt is a 56 y.o. yo male who was admitted on 11/01/2021 with uremia and volume overload in the setting of worsening renal failure  Assessment/Plan: 1. Renal-  crt of 4   6 mos ago-  small echogenic kidneys on u/s-  we all suspect that this is just progression of ckd and now ESRD-  Had HD 1/27 and 1/28 with improvement in symptoms.  Unfortunately the patient has had persistently poor kidney function with worsening creatinine in between dialysis and likely ESRD at this time.   -Has TDC in place -Consult VVS for AVF/G creation -> plan for surgery on 2/3 -CLIP process underway 2. HTN/vol-volume status and hypertension much improved.  Can stop lasix and manage volume w/ HD.  Continue metoprolol 3. Anemia-   most likely due to CKD-   iron stores low- repleting and also gave esa-intermittently required transfusion. Hgb stable today at 9.4. 4. Secondary hyperparathyroidism- PTH 265-  no meds-  will see what phos does with HD for now. Was 5.1 today. No binder for now.   Allen Mathis    Labs: Basic Metabolic Panel: Recent Labs  Lab 11/05/21 0032 11/06/21 0308 11/07/21 0229  NA 137 135 138  K 4.2 3.7 3.6  CL 100 98 100  CO2 23 21* 25  GLUCOSE 125* 149* 99  BUN 74* 87* 33*  CREATININE 8.56* 9.66* 5.61*  CALCIUM 8.0* 8.1* 8.6*  PHOS 7.0* 7.7* 5.1*   Liver Function Tests: Recent Labs  Lab 11/01/21 1307  11/03/21 0055 11/05/21 0032 11/06/21 0308 11/07/21 0229  AST 13*  --   --   --   --   ALT 21  --   --   --   --   ALKPHOS 67  --   --   --   --   BILITOT 0.7  --   --   --   --   PROT 6.8  --   --   --   --   ALBUMIN 3.9   < > 2.9* 3.0* 3.1*   < > = values in this interval not displayed.   No results for input(s): LIPASE, AMYLASE in the last 168 hours. No results for input(s): AMMONIA in the last 168 hours. CBC: Recent Labs  Lab 11/01/21 1307 11/02/21 9381 11/03/21 0055 11/04/21 0245 11/05/21 0032 11/06/21 0308 11/07/21 0229  WBC 7.6   < > 6.3 6.5 7.3 7.8 7.7  NEUTROABS 6.3  --   --   --   --   --   --   HGB 7.3*   < > 8.0* 8.0* 7.7* 9.0* 9.4*  HCT 20.8*   < > 22.5* 21.9* 22.2* 26.2* 27.1*  MCV 85.2   < > 82.7 83.3 85.4  83.4 84.2  PLT 127*   < > 113* 120* 125* 134* 128*   < > = values in this interval not displayed.   Cardiac Enzymes: No results for input(s): CKTOTAL, CKMB, CKMBINDEX, TROPONINI in the last 168 hours. CBG: No results for input(s): GLUCAP in the last 168 hours.  Iron Studies:  No results for input(s): IRON, TIBC, TRANSFERRIN, FERRITIN in the last 72 hours.  Studies/Results: No results found. Medications: Infusions:    Scheduled Medications:  atorvastatin  20 mg Oral Daily   Chlorhexidine Gluconate Cloth  6 each Topical Q0600   Chlorhexidine Gluconate Cloth  6 each Topical Q0600   darbepoetin (ARANESP) injection - DIALYSIS  200 mcg Intravenous Q Tue-HD   feeding supplement (NEPRO CARB STEADY)  237 mL Oral BID BM   furosemide  120 mg Oral BID   metoprolol succinate  50 mg Oral Daily   multivitamin  1 tablet Oral QHS   pantoprazole  40 mg Oral BID AC    have reviewed scheduled and prn medications.  Physical Exam: General: Standing in room, no distress Heart: RRR Lungs: Bilateral chest rise with no increased work of breathing Abdomen: soft, non distended Extremities:  edema - improved  Dialysis Access: right sided tunneled HD cath     11/07/2021,12:23 PM  LOS: 6 days

## 2021-11-07 NOTE — Progress Notes (Signed)
Pt refusing bipap. None in room. Pt on Ra. No resp distress noted.

## 2021-11-07 NOTE — Progress Notes (Signed)
Pt has been accepted at [facility] on TTS schedule. Pt can start on Saturday. Pt will need to arrive at 6:00 for 6:50 chair time. Met with pt at bedside to discuss arrangements and schedule letter provided to pt. Pt agreeable to plan. Arrangements documented on AVS as well. Update provided to nephrologist. Will follow and assist.   Melven Sartorius Renal Navigator [phone]

## 2021-11-07 NOTE — Progress Notes (Addendum)
°  Progress Note    11/07/2021 8:03 AM * No surgery date entered *  Subjective:  no complaints   Vitals:   11/07/21 0017 11/07/21 0500  BP: 138/86 (!) 149/76  Pulse: 77 64  Resp: 20 20  Temp: 99 F (37.2 C) 98.2 F (36.8 C)  SpO2: 97% 99%   Physical Exam: Lungs:  non labored Extremities:  palpable L radial pulse Neurologic: A&O  CBC    Component Value Date/Time   WBC 7.7 11/07/2021 0229   RBC 3.22 (L) 11/07/2021 0229   HGB 9.4 (L) 11/07/2021 0229   HCT 27.1 (L) 11/07/2021 0229   PLT 128 (L) 11/07/2021 0229   MCV 84.2 11/07/2021 0229   MCH 29.2 11/07/2021 0229   MCHC 34.7 11/07/2021 0229   RDW 13.4 11/07/2021 0229   LYMPHSABS 0.7 11/01/2021 1307   MONOABS 0.5 11/01/2021 1307   EOSABS 0.1 11/01/2021 1307   BASOSABS 0.0 11/01/2021 1307    BMET    Component Value Date/Time   NA 138 11/07/2021 0229   K 3.6 11/07/2021 0229   CL 100 11/07/2021 0229   CO2 25 11/07/2021 0229   GLUCOSE 99 11/07/2021 0229   BUN 33 (H) 11/07/2021 0229   CREATININE 5.61 (H) 11/07/2021 0229   CALCIUM 8.6 (L) 11/07/2021 0229   GFRNONAA 11 (L) 11/07/2021 0229    INR No results found for: INR   Intake/Output Summary (Last 24 hours) at 11/07/2021 4782 Last data filed at 11/06/2021 1549 Gross per 24 hour  Intake --  Output 1944 ml  Net -1944 ml     Assessment/Plan:  56 y.o. male with ESRD on HD via [facility]   Plan is for L arm AVF vs AVG Friday with Dr. Scot Dock All questions were answered and he is agreeable to proceed Vein mapping to be performed today Consent ordered   Dagoberto Ligas, PA-C [facility] [phone] 11/07/2021 8:03 AM

## 2021-11-07 NOTE — H&P (View-Only) (Signed)
Subjective: Patient denies any GI complaints.He is going to be prepping for a EGD/Colonoscopy to be done tomorrow.  Objective: Vital signs in last 24 hours: Temp:  [97.1 F (36.2 C)-99 F (37.2 C)] 98.2 F (36.8 C) (02/01 0500) Pulse Rate:  [64-77] 64 (02/01 0500) Resp:  [14-21] 20 (02/01 0500) BP: (138-175)/(76-97) 149/76 (02/01 0500) SpO2:  [96 %-99 %] 99 % (02/01 0500) Weight:  [69.9 kg-72.9 kg] 69.9 kg (02/01 0500) Last BM Date: 11/05/21 (per pt report)  Intake/Output from previous day: 01/31 0701 - 02/01 0700 In: 120 [P.O.:120] Out: 1944 [Urine:350] Intake/Output this shift: No intake/output data recorded.  General appearance: alert, cooperative, appears stated age, and no distress Resp: clear to auscultation bilaterally Cardio: regular rate and rhythm, S1, S2 normal, no murmur, click, rub or gallop GI: soft, non-tender; bowel sounds normal; no masses,  no organomegaly  Lab Results: Recent Labs    11/05/21 0032 11/06/21 0308 11/07/21 0229  WBC 7.3 7.8 7.7  HGB 7.7* 9.0* 9.4*  HCT 22.2* 26.2* 27.1*  PLT 125* 134* 128*   BMET Recent Labs    11/05/21 0032 11/06/21 0308 11/07/21 0229  NA 137 135 138  K 4.2 3.7 3.6  CL 100 98 100  CO2 23 21* 25  GLUCOSE 125* 149* 99  BUN 74* 87* 33*  CREATININE 8.56* 9.66* 5.61*  CALCIUM 8.0* 8.1* 8.6*   LFT Recent Labs    11/07/21 0229  ALBUMIN 3.1*   PT/INR No results for input(s): LABPROT, INR in the last 72 hours. Hepatitis Panel No results for input(s): HEPBSAG, HCVAB, HEPAIGM, HEPBIGM in the last 72 hours. C-Diff No results for input(s): CDIFFTOX in the last 72 hours. No results for input(s): CDIFFPCR in the last 72 hours. Fecal Lactopherrin No results for input(s): FECLLACTOFRN in the last 72 hours.  Studies/Results: No results found.  Medications: I have reviewed the patient's current medications. Prior to Admission:  Medications Prior to Admission  Medication Sig Dispense Refill Last Dose    amLODipine (NORVASC) 5 MG tablet Take 1 tablet by mouth daily.   Past Week   aspirin 81 MG EC tablet Take by mouth.   Past Week   atorvastatin (LIPITOR) 20 MG tablet Take 20 mg by mouth daily.   Past Week   omega-3 acid ethyl esters (LOVAZA) 1 g capsule Take 1 g by mouth daily.   Past Week   TRULICITY 2.37 SE/8.3TD SOPN Inject 0.75 mg into the skin once a week.   10/09/2021   vitamin B-12 (CYANOCOBALAMIN) 100 MCG tablet Take 100 mcg by mouth daily.   Past Week   Scheduled:  atorvastatin  20 mg Oral Daily   Chlorhexidine Gluconate Cloth  6 each Topical Q0600   Chlorhexidine Gluconate Cloth  6 each Topical Q0600   darbepoetin (ARANESP) injection - DIALYSIS  200 mcg Intravenous Q Tue-HD   feeding supplement (NEPRO CARB STEADY)  237 mL Oral BID BM   metoprolol succinate  50 mg Oral Daily   multivitamin  1 tablet Oral QHS   pantoprazole  40 mg Oral BID AC   Assessment/Plan: 1) Symptomatic anemia with positive FOBT awaiting EGD and colonoscopy. He has received 2 units of packed red blood cells and is on IV iron and Aranesp. Also on Protonix. EGD/Colonoscopy scheduled for tomorrow. 2) Acute renal failure superimposed on chronic stage IV kidney disease-on hemodialysis now. 2) Acute hypoxic respiratory failure-improved. 3) New onset atrial fibrillation with RVR on Toprol-XL. 4) HTN. 5) Hyperlipidemia. 6) AODM. 7) Acute combined  systolic/diastolic CHF. 8) Anion gap metabolic acidosis.    LOS: 6 days   Allen Mathis 11/07/2021, 5:47 AM

## 2021-11-07 NOTE — Assessment & Plan Note (Addendum)
BP fluctuating-continue metoprolol-further optimization deferred to the outpatient setting.

## 2021-11-07 NOTE — Progress Notes (Signed)
Subjective: Patient denies any GI complaints.He is going to be prepping for a EGD/Colonoscopy to be done tomorrow.  Objective: Vital signs in last 24 hours: Temp:  [97.1 F (36.2 C)-99 F (37.2 C)] 98.2 F (36.8 C) (02/01 0500) Pulse Rate:  [64-77] 64 (02/01 0500) Resp:  [14-21] 20 (02/01 0500) BP: (138-175)/(76-97) 149/76 (02/01 0500) SpO2:  [96 %-99 %] 99 % (02/01 0500) Weight:  [69.9 kg-72.9 kg] 69.9 kg (02/01 0500) Last BM Date: 11/05/21 (per pt report)  Intake/Output from previous day: 01/31 0701 - 02/01 0700 In: 120 [P.O.:120] Out: 1944 [Urine:350] Intake/Output this shift: No intake/output data recorded.  General appearance: alert, cooperative, appears stated age, and no distress Resp: clear to auscultation bilaterally Cardio: regular rate and rhythm, S1, S2 normal, no murmur, click, rub or gallop GI: soft, non-tender; bowel sounds normal; no masses,  no organomegaly  Lab Results: Recent Labs    11/05/21 0032 11/06/21 0308 11/07/21 0229  WBC 7.3 7.8 7.7  HGB 7.7* 9.0* 9.4*  HCT 22.2* 26.2* 27.1*  PLT 125* 134* 128*   BMET Recent Labs    11/05/21 0032 11/06/21 0308 11/07/21 0229  NA 137 135 138  K 4.2 3.7 3.6  CL 100 98 100  CO2 23 21* 25  GLUCOSE 125* 149* 99  BUN 74* 87* 33*  CREATININE 8.56* 9.66* 5.61*  CALCIUM 8.0* 8.1* 8.6*   LFT Recent Labs    11/07/21 0229  ALBUMIN 3.1*   PT/INR No results for input(s): LABPROT, INR in the last 72 hours. Hepatitis Panel No results for input(s): HEPBSAG, HCVAB, HEPAIGM, HEPBIGM in the last 72 hours. C-Diff No results for input(s): CDIFFTOX in the last 72 hours. No results for input(s): CDIFFPCR in the last 72 hours. Fecal Lactopherrin No results for input(s): FECLLACTOFRN in the last 72 hours.  Studies/Results: No results found.  Medications: I have reviewed the patient's current medications. Prior to Admission:  Medications Prior to Admission  Medication Sig Dispense Refill Last Dose    amLODipine (NORVASC) 5 MG tablet Take 1 tablet by mouth daily.   Past Week   aspirin 81 MG EC tablet Take by mouth.   Past Week   atorvastatin (LIPITOR) 20 MG tablet Take 20 mg by mouth daily.   Past Week   omega-3 acid ethyl esters (LOVAZA) 1 g capsule Take 1 g by mouth daily.   Past Week   TRULICITY 2.70 JJ/0.0XF SOPN Inject 0.75 mg into the skin once a week.   10/09/2021   vitamin B-12 (CYANOCOBALAMIN) 100 MCG tablet Take 100 mcg by mouth daily.   Past Week   Scheduled:  atorvastatin  20 mg Oral Daily   Chlorhexidine Gluconate Cloth  6 each Topical Q0600   Chlorhexidine Gluconate Cloth  6 each Topical Q0600   darbepoetin (ARANESP) injection - DIALYSIS  200 mcg Intravenous Q Tue-HD   feeding supplement (NEPRO CARB STEADY)  237 mL Oral BID BM   metoprolol succinate  50 mg Oral Daily   multivitamin  1 tablet Oral QHS   pantoprazole  40 mg Oral BID AC   Assessment/Plan: 1) Symptomatic anemia with positive FOBT awaiting EGD and colonoscopy. He has received 2 units of packed red blood cells and is on IV iron and Aranesp. Also on Protonix. EGD/Colonoscopy scheduled for tomorrow. 2) Acute renal failure superimposed on chronic stage IV kidney disease-on hemodialysis now. 2) Acute hypoxic respiratory failure-improved. 3) New onset atrial fibrillation with RVR on Toprol-XL. 4) HTN. 5) Hyperlipidemia. 6) AODM. 7) Acute combined  systolic/diastolic CHF. 8) Anion gap metabolic acidosis.    LOS: 6 days   Allen Mathis 11/07/2021, 5:47 AM

## 2021-11-07 NOTE — Assessment & Plan Note (Signed)
Continue statin. 

## 2021-11-07 NOTE — Progress Notes (Signed)
Upper extremity venous mapping study completed.  Preliminary results relayed to Sloan Leiter, MD and RN on the floor.  See CV Proc for preliminary results report.   Darlin Coco, RDMS, RVT

## 2021-11-08 ENCOUNTER — Encounter (HOSPITAL_COMMUNITY): Payer: Self-pay | Admitting: Family Medicine

## 2021-11-08 ENCOUNTER — Inpatient Hospital Stay (HOSPITAL_COMMUNITY): Payer: BC Managed Care – PPO | Admitting: Anesthesiology

## 2021-11-08 ENCOUNTER — Encounter (HOSPITAL_COMMUNITY)
Admission: EM | Disposition: A | Discharge status: Home / Self Care | Payer: Self-pay | Source: Home / Self Care | Attending: Internal Medicine

## 2021-11-08 DIAGNOSIS — N186 End stage renal disease: Secondary | ICD-10-CM

## 2021-11-08 DIAGNOSIS — Z992 Dependence on renal dialysis: Secondary | ICD-10-CM

## 2021-11-08 HISTORY — PX: BIOPSY: SHX5522

## 2021-11-08 HISTORY — PX: COLONOSCOPY WITH PROPOFOL: SHX5780

## 2021-11-08 HISTORY — PX: POLYPECTOMY: SHX5525

## 2021-11-08 HISTORY — PX: ESOPHAGOGASTRODUODENOSCOPY (EGD) WITH PROPOFOL: SHX5813

## 2021-11-08 LAB — RENAL FUNCTION PANEL
Albumin: 2.9 g/dL — ABNORMAL LOW (ref 3.5–5.0)
Anion gap: 13 (ref 5–15)
BUN: 43 mg/dL — ABNORMAL HIGH (ref 6–20)
CO2: 23 mmol/L (ref 22–32)
Calcium: 8.7 mg/dL — ABNORMAL LOW (ref 8.9–10.3)
Chloride: 100 mmol/L (ref 98–111)
Creatinine, Ser: 7.21 mg/dL — ABNORMAL HIGH (ref 0.61–1.24)
GFR, Estimated: 8 mL/min — ABNORMAL LOW (ref >60)
Glucose, Bld: 88 mg/dL (ref 70–99)
Phosphorus: 6.1 mg/dL — ABNORMAL HIGH (ref 2.5–4.6)
Potassium: 3.8 mmol/L (ref 3.5–5.1)
Sodium: 136 mmol/L (ref 135–145)

## 2021-11-08 SURGERY — ESOPHAGOGASTRODUODENOSCOPY (EGD) WITH PROPOFOL
Anesthesia: Monitor Anesthesia Care

## 2021-11-08 MED ORDER — KIDNEY FAILURE BOOK: Freq: Once | Status: AC

## 2021-11-08 MED ORDER — PROPOFOL 10 MG/ML IV BOLUS
INTRAVENOUS | Status: DC | PRN
  Administered 2021-11-08: 10 mg via INTRAVENOUS
  Administered 2021-11-08: 20 mg via INTRAVENOUS

## 2021-11-08 MED ORDER — METOPROLOL SUCCINATE ER 50 MG PO TB24
75.0000 mg | ORAL_TABLET | Freq: Every day | ORAL | Status: DC
  Administered 2021-11-09: 75 mg via ORAL
  Filled 2021-11-08: qty 1

## 2021-11-08 MED ORDER — HEPARIN SODIUM (PORCINE) 1000 UNIT/ML IJ SOLN
INTRAMUSCULAR | Status: AC
  Filled 2021-11-08: qty 1

## 2021-11-08 MED ORDER — HEPARIN SODIUM (PORCINE) 1000 UNIT/ML IJ SOLN: 1000.0000 [IU] | Freq: Once | INTRAMUSCULAR | Status: DC

## 2021-11-08 MED ORDER — PROPOFOL 500 MG/50ML IV EMUL
INTRAVENOUS | Status: DC | PRN
  Administered 2021-11-08: 125 ug/kg/min via INTRAVENOUS
  Administered 2021-11-08: 100 ug/kg/min via INTRAVENOUS

## 2021-11-08 MED ORDER — SODIUM CHLORIDE 0.9 % IV SOLN: INTRAVENOUS | Status: DC

## 2021-11-08 MED ORDER — HEPARIN SODIUM (PORCINE) 1000 UNIT/ML IJ SOLN
1600.0000 [IU] | Freq: Once | INTRAMUSCULAR | Status: AC
  Administered 2021-11-08: 1600 [IU] via INTRAVENOUS

## 2021-11-08 SURGICAL SUPPLY — 25 items

## 2021-11-08 NOTE — Anesthesia Preprocedure Evaluation (Addendum)
Anesthesia Evaluation  Patient identified by MRN, date of birth, ID band Patient awake    Reviewed: Allergy & Precautions, H&P , NPO status , Patient's Chart, lab work & pertinent test results  Airway Mallampati: II  TM Distance: >3 FB Neck ROM: Full    Dental no notable dental hx. (+) Partial Lower, Partial Upper, Dental Advisory Given   Pulmonary asthma , COPD, former smoker,    Pulmonary exam normal breath sounds clear to auscultation       Cardiovascular hypertension, Pt. on medications  Rhythm:Regular Rate:Normal     Neuro/Psych negative neurological ROS  negative psych ROS   GI/Hepatic negative GI ROS, Neg liver ROS,   Endo/Other  negative endocrine ROSdiabetes  Renal/GU Renal InsufficiencyRenal disease  negative genitourinary   Musculoskeletal   Abdominal   Peds  Hematology  (+) Blood dyscrasia, anemia ,   Anesthesia Other Findings   Reproductive/Obstetrics negative OB ROS                            Anesthesia Physical Anesthesia Plan  ASA: 3  Anesthesia Plan: MAC   Post-op Pain Management: Minimal or no pain anticipated   Induction: Intravenous  PONV Risk Score and Plan: 1 and Propofol infusion  Airway Management Planned: Natural Airway and Nasal Cannula  Additional Equipment:   Intra-op Plan:   Post-operative Plan:   Informed Consent: I have reviewed the patients History and Physical, chart, labs and discussed the procedure including the risks, benefits and alternatives for the proposed anesthesia with the patient or authorized representative who has indicated his/her understanding and acceptance.     Dental advisory given  Plan Discussed with: CRNA  Anesthesia Plan Comments:        Anesthesia Quick Evaluation

## 2021-11-08 NOTE — Transfer of Care (Signed)
Immediate Anesthesia Transfer of Care Note  Patient: Allen Mathis  Procedure(s) Performed: ESOPHAGOGASTRODUODENOSCOPY (EGD) WITH PROPOFOL COLONOSCOPY WITH PROPOFOL BIOPSY POLYPECTOMY  Patient Location: Endoscopy Unit  Anesthesia Type:MAC  Level of Consciousness: awake, alert  and patient cooperative  Airway & Oxygen Therapy: Patient Spontanous Breathing  Post-op Assessment: Report given to RN and Post -op Vital signs reviewed and stable  Post vital signs: Reviewed and stable  Last Vitals:  Vitals Value Taken Time  BP 143/67 11/08/21 1508  Temp    Pulse 56 11/08/21 1511  Resp 16 11/08/21 1511  SpO2 99 % 11/08/21 1511  Vitals shown include unvalidated device data.  Last Pain:  Vitals:   11/08/21 1508  TempSrc:   PainSc: 0-No pain      Patients Stated Pain Goal: 0 (37/09/64 3838)  Complications: No notable events documented.

## 2021-11-08 NOTE — Interval H&P Note (Signed)
History and Physical Interval Note:  11/08/2021 1:42 PM  Allen Mathis  has presented today for surgery, with the diagnosis of Anemia and heme positive stool.  The various methods of treatment have been discussed with the patient and family. After consideration of risks, benefits and other options for treatment, the patient has consented to  Procedure(s): ESOPHAGOGASTRODUODENOSCOPY (EGD) WITH PROPOFOL (N/A) COLONOSCOPY WITH PROPOFOL (N/A) as a surgical intervention.  The patient's history has been reviewed, patient examined, no change in status, stable for surgery.  I have reviewed the patient's chart and labs.  Questions were answered to the patient's satisfaction.     [name] D

## 2021-11-08 NOTE — Op Note (Signed)
[facility] Patient Name: Allen Mathis Procedure Date : 11/08/2021 MRN: [id number] Attending MD: Carol Ada , MD Date of Birth: December 06, 1965 CSN: [id number] Age: 56 Admit Type: Inpatient Procedure:                Upper GI endoscopy Indications:              Iron deficiency anemia, Heme positive stool Providers:                Carol Ada, MD, Dulcy Fanny, Frazier Richards,                            Technician Referring MD:              Medicines:                Propofol per Anesthesia Complications:            No immediate complications. Estimated Blood Loss:     Estimated blood loss: none. Procedure:                Pre-Anesthesia Assessment:                           - Prior to the procedure, a History and Physical                            was performed, and patient medications and                            allergies were reviewed. The patient's tolerance of                            previous anesthesia was also reviewed. The risks                            and benefits of the procedure and the sedation                            options and risks were discussed with the patient.                            All questions were answered, and informed consent                            was obtained. Prior Anticoagulants: The patient has                            taken no previous anticoagulant or antiplatelet                            agents. ASA Grade Assessment: III - A patient with                            severe systemic disease. After reviewing the risks  and benefits, the patient was deemed in                            satisfactory condition to undergo the procedure.                           - Sedation was administered by an anesthesia                            professional. Deep sedation was attained.                           After obtaining informed consent, the endoscope was                            passed under direct  vision. Throughout the                            procedure, the patient's blood pressure, pulse, and                            oxygen saturations were monitored continuously. The                            PCF-HQ190L ([id number]) Olympus peds colonscope was                            introduced through the mouth, and advanced to the                            fourth part of duodenum. The upper GI endoscopy was                            accomplished without difficulty. The patient                            tolerated the procedure well. Scope In: Scope Out: Findings:      The esophagus was normal.      The entire examined stomach was normal. Biopsies were taken with a cold       forceps for Helicobacter pylori testing.      Patchy moderate inflammation characterized by erythema was found in the       duodenal bulb. Impression:               - Normal esophagus.                           - Normal stomach. Biopsied.                           - Duodenitis. Recommendation:           - Return patient to hospital ward for ongoing care.                           - Resume regular diet.                           -  Continue present medications.                           - Await pathology results.                           - Proceed with the colonoscopy. Procedure Code(s):        --- Professional ---                           [phone], Esophagogastroduodenoscopy, flexible,                            transoral; with biopsy, single or multiple Diagnosis Code(s):        --- Professional ---                           K29.80, Duodenitis without bleeding                           D50.9, Iron deficiency anemia, unspecified                           R19.5, Other fecal abnormalities CPT copyright 2019 [employer]. All rights reserved. The codes documented in this report are preliminary and upon coder review may  be revised to meet current compliance requirements. Carol Ada, MD Carol Ada, MD 11/08/2021 3:05:34 PM This report has been signed electronically. Number of Addenda: 0

## 2021-11-08 NOTE — Progress Notes (Signed)
VASCULAR SURGERY:  I have reviewed his vein map that was done yesterday.  He appears to have an adequate cephalic vein for a fistula.  He can possibly have a forearm fistula or more likely have an upper arm brachiocephalic fistula.  He is scheduled for surgery tomorrow.  Gae Gallop, MD 8:19 AM

## 2021-11-08 NOTE — Plan of Care (Signed)

## 2021-11-08 NOTE — Op Note (Signed)
[facility] Patient Name: Allen Mathis Procedure Date : 11/08/2021 MRN: [id number] Attending MD: Allen Mathis , MD Date of Birth: June 24, 1966 CSN: [id number] Age: 56 Admit Type: Inpatient Procedure:                Colonoscopy Indications:              Heme positive stool, Iron deficiency anemia Providers:                Allen Ada, MD, Allen Mathis, Allen Mathis,                            Technician Referring MD:              Medicines:                 Complications:            No immediate complications. Estimated Blood Loss:     Estimated blood loss: none. Procedure:                Pre-Anesthesia Assessment:                           - Prior to the procedure, a History and Physical                            was performed, and patient medications and                            allergies were reviewed. The patient's tolerance of                            previous anesthesia was also reviewed. The risks                            and benefits of the procedure and the sedation                            options and risks were discussed with the patient.                            All questions were answered, and informed consent                            was obtained. Prior Anticoagulants: The patient has                            taken no previous anticoagulant or antiplatelet                            agents. ASA Grade Assessment: III - A patient with                            severe systemic disease. After reviewing the risks                            and  benefits, the patient was deemed in                            satisfactory condition to undergo the procedure.                           - Sedation was administered by an anesthesia                            professional. Deep sedation was attained.                           After obtaining informed consent, the colonoscope                            was passed under direct vision. Throughout the                             procedure, the patient's blood pressure, pulse, and                            oxygen saturations were monitored continuously. The                            PCF-HQ190L ([id number]) Olympus peds colonscope was                            introduced through the anus and advanced to the the                            cecum, identified by appendiceal orifice and                            ileocecal valve. The colonoscopy was performed                            without difficulty. The patient tolerated the                            procedure well. The quality of the bowel                            preparation was evaluated using the BBPS [facility]                            Bowel Preparation Scale) with scores of: Right                            Colon = 2 (minor amount of residual staining, small                            fragments of stool and/or opaque liquid, but mucosa  seen well), Transverse Colon = 2 (minor amount of                            residual staining, small fragments of stool and/or                            opaque liquid, but mucosa seen well) and Left Colon                            = 2 (minor amount of residual staining, small                            fragments of stool and/or opaque liquid, but mucosa                            seen well). The total BBPS score equals 6. The                            quality of the bowel preparation was good. The                            ileocecal valve, appendiceal orifice, and rectum                            were photographed. Scope In: 2:34:34 PM Scope Out: 2:56:51 PM Scope Withdrawal Time: 0 hours 17 minutes 5 seconds  Total Procedure Duration: 0 hours 22 minutes 17 seconds  Findings:      A 3 mm polyp was found in the hepatic flexure. The polyp was sessile.       The polyp was removed with a cold snare. Resection and retrieval were       complete.      Scattered small and  large-mouthed diverticula were found in the sigmoid       colon, descending colon and transverse colon.      Two liters of lavage were required to obtain good to excellent views of       the mucosa. There was no evidence of any source of bleeding in the colon       to explain his anemia. Impression:               - One 3 mm polyp at the hepatic flexure, removed                            with a cold snare. Resected and retrieved.                           - Diverticulosis in the sigmoid colon, in the                            descending colon and in the transverse colon. Recommendation:           - Return patient to hospital ward for ongoing care.                           -  Resume regular diet.                           - Continue present medications.                           - Await pathology results.                           - Repeat colonoscopy in 7 years for surveillance.                           - Daily iron supplementation.                           - If his HGB declines with iron supplementation a                            VCE will be required.                           - Follow up with Dr. Collene Mathis in 2-4 weeks post                            discharge. Procedure Code(s):        --- Professional ---                           [phone], Colonoscopy, flexible; with removal of                            tumor(s), polyp(s), or other lesion(s) by snare                            technique Diagnosis Code(s):        --- Professional ---                           K63.5, Polyp of colon                           R19.5, Other fecal abnormalities                           D50.9, Iron deficiency anemia, unspecified                           K57.30, Diverticulosis of large intestine without                            perforation or abscess without bleeding CPT copyright 2019 American [employer] Association. All rights reserved. The codes documented in this report are preliminary and upon coder  review may  be revised to meet current compliance requirements. Allen Ada, MD Allen Ada, MD 11/08/2021 3:10:09 PM This report has been signed electronically. Number of Addenda: 0

## 2021-11-08 NOTE — Anesthesia Postprocedure Evaluation (Signed)
Anesthesia Post Note  Patient: Allen Mathis  Procedure(s) Performed: ESOPHAGOGASTRODUODENOSCOPY (EGD) WITH PROPOFOL COLONOSCOPY WITH PROPOFOL BIOPSY POLYPECTOMY     Patient location during evaluation: Endoscopy Anesthesia Type: MAC Level of consciousness: awake and alert Pain management: pain level controlled Vital Signs Assessment: post-procedure vital signs reviewed and stable Respiratory status: spontaneous breathing, nonlabored ventilation and respiratory function stable Cardiovascular status: stable and blood pressure returned to baseline Postop Assessment: no apparent nausea or vomiting Anesthetic complications: no   No notable events documented.  Last Vitals:  Vitals:   11/08/21 1520 11/08/21 1526  BP: (!) 213/87 (!) 186/82  Pulse: 64 62  Resp: 19 20  Temp:    SpO2: 98% 99%    Last Pain:  Vitals:   11/08/21 1515  TempSrc: Temporal  PainSc: 0-No pain                 Caidan Hubbert,W. EDMOND

## 2021-11-08 NOTE — Progress Notes (Signed)
Rounded on patient today while in hemodialysis in correlation to transition to outpatient HD. Ordered  Kidney Failure book. Patient educated at the bedside regarding care of tunneled dialysis catheter, AV fistula/graft site care, assessment of thrill daily and proper medication administration on HD days.  Patient also educated on the importance of adhering to scheduled dialysis treatments, the effects of fluid overload, hyperkalemia and hyperphosphatemia. Patient capable of re-verbalizing via teach back method. Patient reports that he understands the importance of attending his outpatient dialysis treatments and that he wants to be around to see his grandchildren grow up. He became tearful. Explained to the patient the different modalities of dialysis and for him to talk all options through with his nephrologist. Also educated patient on services available through the interdisciplinary team in the clinic setting. Patient with no further questions at this time. Handouts and contact information provided to patient for any further assistance. Will follow as appropriate.   Dorthey Sawyer, RN  Dialysis Nurse Coordinator Phone: [phone]

## 2021-11-08 NOTE — Progress Notes (Signed)
PROGRESS NOTE        PATIENT DETAILS Name: Allen Mathis Age: 56 y.o. 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  He was evaluated by nephrology-deemed to have ESRD and started on HD.  He also was found to have severe anemia-requiring PRBC transfusion.  See below for further details.  Significant events: 1/26>> admit to [facility] respiratory failure due to decompensated CHF in the setting of AKI and worsening anemia.  Significant imaging studies: 1/26>> renal ultrasound: No hydronephrosis 1/26>> CXR: Bilateral pulmonary infiltrates-likely pulmonary edema.  Significant microbiology data: 1/26>> COVID/influenza PCR: Negative 1/26>> urine culture: No growth  Subjective: Seen earlier this morning at hemodialysis-no major issues overnight.  Denies any chest pain or shortness of breath.  Was lying comfortably in bed.  Objective: Vitals: Blood pressure (!) 164/84, pulse 71, temperature 97.6 F (36.4 C), temperature source Oral, resp. rate 18, height 5\' 5"  (1.651 m), weight 70.7 kg, SpO2 97 %.   Exam: Gen Exam:Alert awake-not in any distress HEENT:atraumatic, normocephalic Chest: B/L clear to auscultation anteriorly CVS:S1S2 regular Abdomen:soft non tender, non distended Extremities:no edema Neurology: Non focal Skin: no rash   Pertinent Labs/Radiology: CBC Latest Ref Rng & Units 11/07/2021 11/06/2021 11/05/2021  WBC 4.0 - 10.5 K/uL 7.7 7.8 7.3  Hemoglobin 13.0 - 17.0 g/dL 9.4(L) 9.0(L) 7.7(L)  Hematocrit 39.0 - 52.0 % 27.1(L) 26.2(L) 22.2(L)  Platelets 150 - 400 K/uL 128(L) 134(L) 125(L)    Lab Results  Component Value Date   NA 136 11/08/2021   K 3.8 11/08/2021   CL 100 11/08/2021   CO2 23 11/08/2021       Assessment/Plan: * Acute respiratory failure with hypoxia due to decompensated combined systolic/diastolic heart failure in the setting of worsening renal function.- (present on admission) Required BiPAP on initial presentation-significant improvement in volume status/hypoxia with hemodialysis-he is now stable on room air. .  Echo on 1/27 with 45-50% EF.  Acute renal failure on CKD stage IV-now ESRD- (present on admission) Felt to have progression of underlying CKD-now ESRD-nephrology following and managing HD care.  Vascular surgery planning left AV fistula/graft on Friday.  Clipping procedure is complete-he has been accepted at outpatient HD center-TTS schedule-starting this Saturday.  Atrial fibrillation with RVR ([place])- (present on admission) Maintaining sinus rhythm-has had intermittent brief runs of RVR over the past few days-continue beta-blocker-given concern for GI bleeding/severity of anemia-anticoagulation being avoided.  Per cardiology-if patient developed persistent atrial fibrillation-can revisit issue of anticoagulation.  Multifactorial anemia- (present on admission) Multifactorial anemia due to a combination of CKD-and possible slow GI bleeding (FOBT positive).  Hemoglobin stable-has required PRBC transfusion during this hospitalization.  Nephrology following and managing IV iron/Aranesp.  Gastroenterology planning EGD/colonoscopy today  HTN (hypertension)- (present on admission) BP on the higher side-but should improve with hemodialysis-continue metoprolol.  Follow-and adjust medications accordingly.    HLD (hyperlipidemia)- (present on admission) Continue statin.  Nutrition Status: Nutrition Problem: Increased nutrient needs Etiology: chronic illness Signs/Symptoms: estimated needs Interventions: MVI, Nepro shake  BMI: Estimated body mass index is 25.94 kg/m as calculated from the following:   Height as of this encounter: 5\' 5"  (1.651 m).  Weight as of this  encounter: 70.7 kg.    DVT Prophylaxis: SCD's Procedures: None Consults: Nephrology, vascular surgery Code Status:Full code  Family Communication: None at bedside   Disposition Plan: Status is: Inpatient Remains inpatient appropriate because: New ESRD-awaiting clipping-severe anemia-awaiting endoscopic evaluation scheduled for 2/2.  Planned Discharge Destination: Home    Diet: Diet Order             Diet NPO time specified Except for: Sips with Meds  Diet effective midnight           Diet NPO time specified  Diet effective now                     Antimicrobial agents: Anti-infectives (From admission, onward)    Start     Dose/Rate Route Frequency Ordered Stop   11/02/21 1519  ceFAZolin (ANCEF) IVPB 2g/100 mL premix        over 30 Minutes Intravenous Continuous PRN 11/02/21 1520 11/02/21 1519        MEDICATIONS: Scheduled Meds:  atorvastatin  20 mg Oral Daily   Chlorhexidine Gluconate Cloth  6 each Topical Q0600   darbepoetin (ARANESP) injection - DIALYSIS  200 mcg Intravenous Q Tue-HD   feeding supplement (NEPRO CARB STEADY)  237 mL Oral BID BM   heparin sodium (porcine)       [START ON 11/09/2021] metoprolol succinate  75 mg Oral Daily   multivitamin  1 tablet Oral QHS   pantoprazole  40 mg Oral BID AC   Continuous Infusions: PRN Meds:.metoprolol tartrate   I have personally reviewed following labs and imaging studies  LABORATORY DATA: CBC: Recent Labs  Lab 11/01/21 1307 11/02/21 0652 11/03/21 0055 11/04/21 0245 11/05/21 0032 11/06/21 0308 11/07/21 0229  WBC 7.6   < > 6.3 6.5 7.3 7.8 7.7  NEUTROABS 6.3  --   --   --   --   --   --   HGB 7.3*   < > 8.0* 8.0* 7.7* 9.0* 9.4*  HCT 20.8*   < > 22.5* 21.9* 22.2* 26.2* 27.1*  MCV 85.2   < > 82.7 83.3 85.4 83.4 84.2  PLT 127*   < > 113* 120* 125* 134* 128*   < > = values in this interval not displayed.     Basic Metabolic Panel: Recent Labs  Lab 11/04/21 0245 11/05/21 0032 11/06/21 0308  11/07/21 0229 11/08/21 0111  NA 137 137 135 138 136  K 3.6 4.2 3.7 3.6 3.8  CL 99 100 98 100 100  CO2 24 23 21* 25 23  GLUCOSE 118* 125* 149* 99 88  BUN 55* 74* 87* 33* 43*  CREATININE 6.95* 8.56* 9.66* 5.61* 7.21*  CALCIUM 8.0* 8.0* 8.1* 8.6* 8.7*  PHOS 5.8* 7.0* 7.7* 5.1* 6.1*     GFR: Estimated Creatinine Clearance: 9.8 mL/min (A) (by C-G formula based on SCr of 7.21 mg/dL (H)).  Liver Function Tests: Recent Labs  Lab 11/01/21 1307 11/03/21 0055 11/04/21 0245 11/05/21 0032 11/06/21 0308 11/07/21 0229 11/08/21 0111  AST 13*  --   --   --   --   --   --   ALT 21  --   --   --   --   --   --   ALKPHOS 67  --   --   --   --   --   --   BILITOT 0.7  --   --   --   --   --   --  PROT 6.8  --   --   --   --   --   --   ALBUMIN 3.9   < > 2.8* 2.9* 3.0* 3.1* 2.9*   < > = values in this interval not displayed.    No results for input(s): LIPASE, AMYLASE in the last 168 hours. No results for input(s): AMMONIA in the last 168 hours.  Coagulation Profile: No results for input(s): INR, PROTIME in the last 168 hours.  Cardiac Enzymes: No results for input(s): CKTOTAL, CKMB, CKMBINDEX, TROPONINI in the last 168 hours.  BNP (last 3 results) No results for input(s): PROBNP in the last 8760 hours.  Lipid Profile: No results for input(s): CHOL, HDL, LDLCALC, TRIG, CHOLHDL, LDLDIRECT in the last 72 hours.  Thyroid Function Tests: No results for input(s): TSH, T4TOTAL, FREET4, T3FREE, THYROIDAB in the last 72 hours.  Anemia Panel: No results for input(s): VITAMINB12, FOLATE, FERRITIN, TIBC, IRON, RETICCTPCT in the last 72 hours.  Urine analysis:    Component Value Date/Time   COLORURINE STRAW (A) 11/01/2021 1307   APPEARANCEUR CLEAR 11/01/2021 1307   LABSPEC 1.010 11/01/2021 1307   PHURINE 5.0 11/01/2021 1307   GLUCOSEU 150 (A) 11/01/2021 1307   HGBUR MODERATE (A) 11/01/2021 1307   BILIRUBINUR NEGATIVE 11/01/2021 1307   KETONESUR 5 (A) 11/01/2021 1307   PROTEINUR  >=300 (A) 11/01/2021 1307   NITRITE NEGATIVE 11/01/2021 1307   LEUKOCYTESUR NEGATIVE 11/01/2021 1307    Sepsis Labs: Lactic Acid, Venous No results found for: LATICACIDVEN  MICROBIOLOGY: Recent Results (from the past 240 hour(s))  Urine Culture     Status: None   Collection Time: 11/01/21  1:08 PM   Specimen: Urine, Clean Catch  Result Value Ref Range Status   Specimen Description   Final    URINE, CLEAN CATCH Performed at [facility], [place] [address] [place], [place] [address]    Special Requests   Final    NONE Performed at [facility], [place] [address], [place], [place] [address]    Culture   Final    NO GROWTH Performed at [facility], [place] [address], [place], [place] [address]    Report Status 11/02/2021 FINAL  Final  Resp Panel by RT-PCR (Flu A&B, Covid) Nasopharyngeal Swab     Status: None   Collection Time: 11/01/21  5:55 PM   Specimen: Nasopharyngeal Swab; Nasopharyngeal(NP) swabs in vial transport medium  Result Value Ref Range Status   SARS Coronavirus 2 by RT PCR NEGATIVE NEGATIVE Final    Comment: (NOTE) SARS-CoV-2 target nucleic acids are NOT DETECTED.  The SARS-CoV-2 RNA is generally detectable in upper respiratory specimens during the acute phase of infection. The lowest concentration of SARS-CoV-2 viral copies this assay can detect is 138 copies/mL. A negative result does not preclude SARS-Cov-2 infection and should not be used as the sole basis for treatment or other patient management decisions. A negative result may occur with  improper specimen collection/handling, submission of specimen other than nasopharyngeal swab, presence of viral mutation(s) within the areas targeted by this assay, and inadequate number of viral copies(<138 copies/mL). A negative result must be combined with clinical observations, patient history, and epidemiological information. The expected result is Negative.  Fact Sheet for  Patients:  EntrepreneurPulse.com.au  Fact Sheet for Healthcare Providers:  IncredibleEmployment.be  This test is no t yet approved or cleared by the [place] FDA and  has been authorized for detection and/or diagnosis of SARS-CoV-2 by FDA  under an Emergency Use Authorization (EUA). This EUA will remain  in effect (meaning this test can be used) for the duration of the COVID-19 declaration under Section 564(b)(1) of the Act, 21 U.S.C.section 360bbb-3(b)(1), unless the authorization is terminated  or revoked sooner.       Influenza A by PCR NEGATIVE NEGATIVE Final   Influenza B by PCR NEGATIVE NEGATIVE Final    Comment: (NOTE) The Xpert Xpress SARS-CoV-2/FLU/RSV plus assay is intended as an aid in the diagnosis of influenza from Nasopharyngeal swab specimens and should not be used as a sole basis for treatment. Nasal washings and aspirates are unacceptable for Xpert Xpress SARS-CoV-2/FLU/RSV testing.  Fact Sheet for Patients: EntrepreneurPulse.com.au  Fact Sheet for Healthcare Providers: IncredibleEmployment.be  This test is not yet approved or cleared by the [place] FDA and has been authorized for detection and/or diagnosis of SARS-CoV-2 by FDA under an Emergency Use Authorization (EUA). This EUA will remain in effect (meaning this test can be used) for the duration of the COVID-19 declaration under Section 564(b)(1) of the Act, 21 U.S.C. section 360bbb-3(b)(1), unless the authorization is terminated or revoked.  Performed at [facility], [place] [address], [place],  [address]     RADIOLOGY STUDIES/RESULTS: VAS [place] UPPER EXT VEIN MAPPING (PRE-OP AVF)  Result Date: 11/07/2021 [place] MAPPING Patient Name:  SAULO ANTHIS  Date of Exam:   11/07/2021 Medical Rec #: [id number]         Accession #:    [id number] Date of Birth: 08-30-1966         Patient Gender: M  Patient Age:   32 years Exam Location:  [facility] Procedure:      VAS [place] UPPER EXT VEIN MAPPING (PRE-OP AVF) Referring Phys: Mikeal Hawthorne PEEPLES --------------------------------------------------------------------------------  Indications: Pre-access. History: ESRD.  Comparison Study: No prior studies. Performing Technologist: Darlin Coco RDMS, RVT  Examination Guidelines: A complete evaluation includes B-mode imaging, spectral Doppler, color Doppler, and power Doppler as needed of all accessible portions of each vessel. Bilateral testing is considered an integral part of a complete examination. Limited examinations for reoccurring indications may be performed as noted. +-----------------+-------------+----------+--------+  Right Cephalic    Diameter (cm) Depth (cm) Findings  +-----------------+-------------+----------+--------+  Shoulder              0.39         1.04              +-----------------+-------------+----------+--------+  Prox upper arm        0.37         0.58              +-----------------+-------------+----------+--------+  Mid upper arm         0.41         0.36              +-----------------+-------------+----------+--------+  Dist upper arm        0.42         0.31              +-----------------+-------------+----------+--------+  Antecubital fossa     0.42         1.04              +-----------------+-------------+----------+--------+  Prox forearm          0.39         0.61              +-----------------+-------------+----------+--------+  Mid forearm  0.32         0.37              +-----------------+-------------+----------+--------+  Dist forearm          0.29         0.24              +-----------------+-------------+----------+--------+  Wrist                 0.17         0.27              +-----------------+-------------+----------+--------+ +-----------------+-------------+----------+----------------------+  Right Basilic     Diameter (cm) Depth (cm)        Findings          +-----------------+-------------+----------+----------------------+  Prox upper arm        0.49                                         +-----------------+-------------+----------+----------------------+  Mid upper arm         0.40                        thrombus         +-----------------+-------------+----------+----------------------+  Dist upper arm        0.55                        thrombus         +-----------------+-------------+----------+----------------------+  Antecubital fossa     0.60                 branching and thrombus  +-----------------+-------------+----------+----------------------+  Prox forearm          0.14                                         +-----------------+-------------+----------+----------------------+  Mid forearm                                    not visualized      +-----------------+-------------+----------+----------------------+ +-----------------+-------------+----------+---------+  Left Cephalic     Diameter (cm) Depth (cm) Findings   +-----------------+-------------+----------+---------+  Shoulder              0.37         0.78               +-----------------+-------------+----------+---------+  Prox upper arm        0.39         0.42               +-----------------+-------------+----------+---------+  Mid upper arm         0.36         0.35               +-----------------+-------------+----------+---------+  Dist upper arm        0.38         0.39               +-----------------+-------------+----------+---------+  Antecubital fossa     0.51         0.44               +-----------------+-------------+----------+---------+  Prox forearm          0.37         0.41               +-----------------+-------------+----------+---------+  Mid forearm           0.38         0.24               +-----------------+-------------+----------+---------+  Dist forearm          0.27         0.27    branching  +-----------------+-------------+----------+---------+  Wrist                  0.26         0.24               +-----------------+-------------+----------+---------+ Summary: Right: Incidental right basilic superficial vein thrombosis.  *See table(s) above for measurements and observations.  Diagnosing physician: Deitra Mayo MD Electronically signed by Deitra Mayo MD on 11/07/2021 at 4:39:19 PM.    Final      LOS: 7 days   Oren Binet, MD  [facility]    To contact the attending provider between 7A-7P or the covering provider during after hours 7P-7A, please log into the web site [email or web address] and access using universal [facility] password for that web site. If you do not have the password, please call the hospital operator.  11/08/2021, 12:33 PM

## 2021-11-08 NOTE — Progress Notes (Signed)
Subjective: Patient continues to feel well today.  No specific complaints  Objective Vital signs in last 24 hours: Vitals:   11/08/21 0758 11/08/21 0800 11/08/21 0830 11/08/21 0900  BP: (!) 179/94 (!) 179/94 (!) 179/94 (!) 175/96  Pulse: 73 73 (!) 59 68  Resp:      Temp:      TempSrc:      SpO2:      Weight:      Height:       Weight change: -2.91 kg  Intake/Output Summary (Last 24 hours) at 11/08/2021 6270 Last data filed at 11/07/2021 2123 Gross per 24 hour  Intake --  Output 600 ml  Net -600 ml    Assessment/ Plan: Pt is a 56 y.o. yo male who was admitted on 11/01/2021 with uremia and volume overload in the setting of worsening renal failure  Assessment/Plan: 1. Renal-  crt of 4   6 mos ago-  small echogenic kidneys on u/s-  we all suspect that this is just progression of ckd and now ESRD-  Had HD 1/27 and 1/28 with improvement in symptoms.  Unfortunately the patient has had persistently poor kidney function with worsening creatinine in between dialysis and likely ESRD at this time.   -Has TDC in place -Consult [facility] for AVF/G creation -> plan for surgery on 2/3.  Appreciate help -Has been accepted to [facility] 2. HTN/vol-volume status and hypertension much improved.  Can stop lasix and manage volume w/ HD.  Continue metoprolol 3. Anemia-   most likely due to CKD-   iron stores low- repleting and also gave esa-intermittently required transfusion. Hgb stable today at 9.4. 4. Secondary hyperparathyroidism- PTH 265-phosphorus fairly well managed on dialysis  Dispo: The [facility] schedule.  Patient has AVF/G creation scheduled for tomorrow. He would be stable for discharge after his surgery from our perspective. If he remains in house can do HD Saturday morning.    Reesa Chew    Labs: Basic Metabolic Panel: Recent Labs  Lab 11/06/21 0308 11/07/21 0229 11/08/21 0111  NA 135 138 136  K 3.7 3.6 3.8  CL 98 100 100  CO2 21* 25 23   GLUCOSE 149* 99 88  BUN 87* 33* 43*  CREATININE 9.66* 5.61* 7.21*  CALCIUM 8.1* 8.6* 8.7*  PHOS 7.7* 5.1* 6.1*   Liver Function Tests: Recent Labs  Lab 11/01/21 1307 11/03/21 0055 11/06/21 0308 11/07/21 0229 11/08/21 0111  AST 13*  --   --   --   --   ALT 21  --   --   --   --   ALKPHOS 67  --   --   --   --   BILITOT 0.7  --   --   --   --   PROT 6.8  --   --   --   --   ALBUMIN 3.9   < > 3.0* 3.1* 2.9*   < > = values in this interval not displayed.   No results for input(s): LIPASE, AMYLASE in the last 168 hours. No results for input(s): AMMONIA in the last 168 hours. CBC: Recent Labs  Lab 11/01/21 1307 11/02/21 0652 11/03/21 0055 11/04/21 0245 11/05/21 0032 11/06/21 0308 11/07/21 0229  WBC 7.6   < > 6.3 6.5 7.3 7.8 7.7  NEUTROABS 6.3  --   --   --   --   --   --   HGB 7.3*   < >  8.0* 8.0* 7.7* 9.0* 9.4*  HCT 20.8*   < > 22.5* 21.9* 22.2* 26.2* 27.1*  MCV 85.2   < > 82.7 83.3 85.4 83.4 84.2  PLT 127*   < > 113* 120* 125* 134* 128*   < > = values in this interval not displayed.   Cardiac Enzymes: No results for input(s): CKTOTAL, CKMB, CKMBINDEX, TROPONINI in the last 168 hours. CBG: No results for input(s): GLUCAP in the last 168 hours.  Iron Studies:  No results for input(s): IRON, TIBC, TRANSFERRIN, FERRITIN in the last 72 hours.  Studies/Results: VAS [place] UPPER EXT VEIN MAPPING (PRE-OP AVF)  Result Date: 11/07/2021 [place] MAPPING Patient Name:  Allen Mathis  Date of Exam:   11/07/2021 Medical Rec #: [id number]         Accession #:    [id number] Date of Birth: 09/12/1966         Patient Gender: M Patient Age:   7 years Exam Location:  [facility] Procedure:      VAS [place] UPPER EXT VEIN MAPPING (PRE-OP AVF) Referring Phys: Mikeal Hawthorne Tarvaris Puglia --------------------------------------------------------------------------------  Indications: Pre-access. History: ESRD.  Comparison Study: No prior studies. Performing Technologist: Darlin Coco RDMS, RVT   Examination Guidelines: A complete evaluation includes B-mode imaging, spectral Doppler, color Doppler, and power Doppler as needed of all accessible portions of each vessel. Bilateral testing is considered an integral part of a complete examination. Limited examinations for reoccurring indications may be performed as noted. +-----------------+-------------+----------+--------+  Right Cephalic    Diameter (cm) Depth (cm) Findings  +-----------------+-------------+----------+--------+  Shoulder              0.39         1.04              +-----------------+-------------+----------+--------+  Prox upper arm        0.37         0.58              +-----------------+-------------+----------+--------+  Mid upper arm         0.41         0.36              +-----------------+-------------+----------+--------+  Dist upper arm        0.42         0.31              +-----------------+-------------+----------+--------+  Antecubital fossa     0.42         1.04              +-----------------+-------------+----------+--------+  Prox forearm          0.39         0.61              +-----------------+-------------+----------+--------+  Mid forearm           0.32         0.37              +-----------------+-------------+----------+--------+  Dist forearm          0.29         0.24              +-----------------+-------------+----------+--------+  Wrist                 0.17         0.27              +-----------------+-------------+----------+--------+ +-----------------+-------------+----------+----------------------+  Right  Basilic     Diameter (cm) Depth (cm)        Findings         +-----------------+-------------+----------+----------------------+  Prox upper arm        0.49                                         +-----------------+-------------+----------+----------------------+  Mid upper arm         0.40                        thrombus         +-----------------+-------------+----------+----------------------+  Dist upper  arm        0.55                        thrombus         +-----------------+-------------+----------+----------------------+  Antecubital fossa     0.60                 branching and thrombus  +-----------------+-------------+----------+----------------------+  Prox forearm          0.14                                         +-----------------+-------------+----------+----------------------+  Mid forearm                                    not visualized      +-----------------+-------------+----------+----------------------+ +-----------------+-------------+----------+---------+  Left Cephalic     Diameter (cm) Depth (cm) Findings   +-----------------+-------------+----------+---------+  Shoulder              0.37         0.78               +-----------------+-------------+----------+---------+  Prox upper arm        0.39         0.42               +-----------------+-------------+----------+---------+  Mid upper arm         0.36         0.35               +-----------------+-------------+----------+---------+  Dist upper arm        0.38         0.39               +-----------------+-------------+----------+---------+  Antecubital fossa     0.51         0.44               +-----------------+-------------+----------+---------+  Prox forearm          0.37         0.41               +-----------------+-------------+----------+---------+  Mid forearm           0.38         0.24               +-----------------+-------------+----------+---------+  Dist forearm          0.27         0.27    branching  +-----------------+-------------+----------+---------+  Wrist                 0.26         0.24               +-----------------+-------------+----------+---------+ Summary: Right: Incidental right basilic superficial vein thrombosis.  *See table(s) above for measurements and observations.  Diagnosing physician: Deitra Mayo MD Electronically signed by Deitra Mayo MD on 11/07/2021 at 4:39:19 PM.    Final     Medications: Infusions:    Scheduled Medications:  atorvastatin  20 mg Oral Daily   Chlorhexidine Gluconate Cloth  6 each Topical Q0600   darbepoetin (ARANESP) injection - DIALYSIS  200 mcg Intravenous Q Tue-HD   feeding supplement (NEPRO CARB STEADY)  237 mL Oral BID BM   metoprolol succinate  50 mg Oral Daily   multivitamin  1 tablet Oral QHS   pantoprazole  40 mg Oral BID AC    have reviewed scheduled and prn medications.  Physical Exam: General: Lying in bed, no distress Heart: RRR Lungs: Bilateral chest rise with no increased work of breathing Abdomen: soft, non distended Extremities: Trace edema in the lower extremities, warm and well perfused Dialysis Access: right sided tunneled HD cath    11/08/2021,9:47 AM  LOS: 7 days

## 2021-11-08 NOTE — H&P (View-Only) (Signed)
VASCULAR SURGERY:  I have reviewed his vein map that was done yesterday.  He appears to have an adequate cephalic vein for a fistula.  He can possibly have a forearm fistula or more likely have an upper arm brachiocephalic fistula.  He is scheduled for surgery tomorrow.  Gae Gallop, MD 8:19 AM

## 2021-11-08 NOTE — Anesthesia Preprocedure Evaluation (Addendum)
Anesthesia Evaluation  Patient identified by MRN, date of birth, ID band Patient awake    Reviewed: Allergy & Precautions, NPO status , Patient's Chart, lab work & pertinent test results  Airway Mallampati: II  TM Distance: >3 FB Neck ROM: Full    Dental  (+) Dental Advisory Given, Missing   Pulmonary asthma , COPD, former smoker,    Pulmonary exam normal breath sounds clear to auscultation       Cardiovascular hypertension, Pt. on medications (-) angina+CHF  Normal cardiovascular exam Rhythm:Regular Rate:Normal     Neuro/Psych negative neurological ROS  negative psych ROS   GI/Hepatic negative GI ROS, Neg liver ROS,   Endo/Other  diabetes, Type 2, Oral Hypoglycemic Agents  Renal/GU ESRF and DialysisRenal disease     Musculoskeletal negative musculoskeletal ROS (+)   Abdominal   Peds  Hematology  (+) Blood dyscrasia (Thrombocytopenia), anemia ,   Anesthesia Other Findings   Reproductive/Obstetrics                            Anesthesia Physical Anesthesia Plan  ASA: 3  Anesthesia Plan: Regional   Post-op Pain Management: Tylenol PO (pre-op)   Induction: Intravenous  PONV Risk Score and Plan: 1 and TIVA and Treatment may vary due to age or medical condition  Airway Management Planned: Natural Airway and Nasal Cannula  Additional Equipment:   Intra-op Plan:   Post-operative Plan:   Informed Consent: I have reviewed the patients History and Physical, chart, labs and discussed the procedure including the risks, benefits and alternatives for the proposed anesthesia with the patient or authorized representative who has indicated his/her understanding and acceptance.     Dental advisory given  Plan Discussed with: CRNA  Anesthesia Plan Comments:        Anesthesia Quick Evaluation

## 2021-11-09 ENCOUNTER — Other Ambulatory Visit (HOSPITAL_COMMUNITY): Payer: Self-pay

## 2021-11-09 ENCOUNTER — Encounter (HOSPITAL_COMMUNITY): Payer: Self-pay | Admitting: Family Medicine

## 2021-11-09 ENCOUNTER — Inpatient Hospital Stay (HOSPITAL_COMMUNITY): Payer: BC Managed Care – PPO | Admitting: Anesthesiology

## 2021-11-09 ENCOUNTER — Encounter (HOSPITAL_COMMUNITY)
Admission: EM | Disposition: A | Discharge status: Home / Self Care | Payer: Self-pay | Source: Home / Self Care | Attending: Internal Medicine

## 2021-11-09 HISTORY — PX: AV FISTULA PLACEMENT: SHX1204

## 2021-11-09 LAB — GLUCOSE, CAPILLARY
Glucose-Capillary: 114 mg/dL — ABNORMAL HIGH (ref 70–99)
Glucose-Capillary: 92 mg/dL (ref 70–99)

## 2021-11-09 LAB — RENAL FUNCTION PANEL
Albumin: 3.2 g/dL — ABNORMAL LOW (ref 3.5–5.0)
Anion gap: 11 (ref 5–15)
BUN: 25 mg/dL — ABNORMAL HIGH (ref 6–20)
CO2: 24 mmol/L (ref 22–32)
Calcium: 8.4 mg/dL — ABNORMAL LOW (ref 8.9–10.3)
Chloride: 100 mmol/L (ref 98–111)
Creatinine, Ser: 4.99 mg/dL — ABNORMAL HIGH (ref 0.61–1.24)
GFR, Estimated: 13 mL/min — ABNORMAL LOW (ref >60)
Glucose, Bld: 125 mg/dL — ABNORMAL HIGH (ref 70–99)
Phosphorus: 4.8 mg/dL — ABNORMAL HIGH (ref 2.5–4.6)
Potassium: 3.6 mmol/L (ref 3.5–5.1)
Sodium: 135 mmol/L (ref 135–145)

## 2021-11-09 SURGERY — ARTERIOVENOUS (AV) FISTULA CREATION
Anesthesia: Regional | Site: Arm Lower | Laterality: Left

## 2021-11-09 MED ORDER — SODIUM CHLORIDE 0.9 % IV SOLN: INTRAVENOUS | Status: DC

## 2021-11-09 MED ORDER — HEPARIN SODIUM (PORCINE) 1000 UNIT/ML IJ SOLN
INTRAMUSCULAR | Status: DC | PRN
Start: 2021-11-09 — End: 2021-11-09
  Administered 2021-11-09: 7000 [IU] via INTRAVENOUS

## 2021-11-09 MED ORDER — METOPROLOL SUCCINATE ER 25 MG PO TB24
ORAL_TABLET | ORAL | Status: AC
  Filled 2021-11-09: qty 3

## 2021-11-09 MED ORDER — MIDAZOLAM HCL 2 MG/2ML IJ SOLN
INTRAMUSCULAR | Status: DC | PRN
  Administered 2021-11-09: 1 mg via INTRAVENOUS

## 2021-11-09 MED ORDER — ROPIVACAINE HCL 5 MG/ML IJ SOLN
INTRAMUSCULAR | Status: DC | PRN
Start: 2021-11-09 — End: 2021-11-09
  Administered 2021-11-09: 20 mL via PERINEURAL

## 2021-11-09 MED ORDER — METOPROLOL SUCCINATE ER 25 MG PO TB24
75.0000 mg | ORAL_TABLET | Freq: Every day | ORAL | 3 refills | Status: DC
  Filled 2021-11-09: qty 90, 30d supply, fill #0

## 2021-11-09 MED ORDER — ACETAMINOPHEN 500 MG PO TABS
1000.0000 mg | ORAL_TABLET | Freq: Once | ORAL | Status: AC
  Administered 2021-11-09: 1000 mg via ORAL

## 2021-11-09 MED ORDER — ACETAMINOPHEN 500 MG PO TABS
ORAL_TABLET | ORAL | Status: AC
  Filled 2021-11-09: qty 2

## 2021-11-09 MED ORDER — VITAMIN B-12 100 MCG PO TABS
100.0000 ug | ORAL_TABLET | Freq: Every day | ORAL | 2 refills | Status: AC
  Filled 2021-11-09: qty 30, 30d supply, fill #0

## 2021-11-09 MED ORDER — 0.9 % SODIUM CHLORIDE (POUR BTL) OPTIME
TOPICAL | Status: DC | PRN
  Administered 2021-11-09: 1000 mL

## 2021-11-09 MED ORDER — ORAL CARE MOUTH RINSE: 15.0000 mL | Freq: Once | OROMUCOSAL | Status: AC

## 2021-11-09 MED ORDER — RENA-VITE PO TABS
1.0000 | ORAL_TABLET | Freq: Every day | ORAL | 3 refills | Status: DC
  Filled 2021-11-09: qty 30, 30d supply, fill #0

## 2021-11-09 MED ORDER — MIDAZOLAM HCL 2 MG/2ML IJ SOLN
INTRAMUSCULAR | Status: AC
  Filled 2021-11-09: qty 2

## 2021-11-09 MED ORDER — ONDANSETRON HCL 4 MG/2ML IJ SOLN: 4.0000 mg | Freq: Once | INTRAMUSCULAR | Status: DC | PRN

## 2021-11-09 MED ORDER — FENTANYL CITRATE (PF) 250 MCG/5ML IJ SOLN
INTRAMUSCULAR | Status: AC
  Filled 2021-11-09: qty 5

## 2021-11-09 MED ORDER — CEFAZOLIN SODIUM-DEXTROSE 2-4 GM/100ML-% IV SOLN
2.0000 g | INTRAVENOUS | Status: AC
  Administered 2021-11-09: 2 g via INTRAVENOUS
  Filled 2021-11-09: qty 100

## 2021-11-09 MED ORDER — LIDOCAINE-EPINEPHRINE (PF) 1 %-1:200000 IJ SOLN
INTRAMUSCULAR | Status: AC
  Filled 2021-11-09: qty 30

## 2021-11-09 MED ORDER — HEPARIN 6000 UNIT IRRIGATION SOLUTION
Status: AC
  Filled 2021-11-09: qty 500

## 2021-11-09 MED ORDER — EPHEDRINE SULFATE-NACL 50-0.9 MG/10ML-% IV SOSY
PREFILLED_SYRINGE | INTRAVENOUS | Status: DC | PRN
  Administered 2021-11-09: 5 mg via INTRAVENOUS

## 2021-11-09 MED ORDER — CHLORHEXIDINE GLUCONATE 0.12 % MT SOLN
OROMUCOSAL | Status: AC
  Administered 2021-11-09: 15 mL via OROMUCOSAL
  Filled 2021-11-09: qty 15

## 2021-11-09 MED ORDER — PROPOFOL 10 MG/ML IV BOLUS
INTRAVENOUS | Status: AC
  Filled 2021-11-09: qty 20

## 2021-11-09 MED ORDER — LIDOCAINE HCL (PF) 1 % IJ SOLN
INTRAMUSCULAR | Status: AC
  Filled 2021-11-09: qty 30

## 2021-11-09 MED ORDER — PANTOPRAZOLE SODIUM 40 MG PO TBEC
40.0000 mg | DELAYED_RELEASE_TABLET | Freq: Every day | ORAL | 3 refills | Status: AC
  Filled 2021-11-09: qty 30, 30d supply, fill #0

## 2021-11-09 MED ORDER — HEPARIN SODIUM (PORCINE) 1000 UNIT/ML IJ SOLN
INTRAMUSCULAR | Status: AC
  Filled 2021-11-09: qty 10

## 2021-11-09 MED ORDER — FENTANYL CITRATE (PF) 100 MCG/2ML IJ SOLN: 50.0000 ug | Freq: Once | INTRAMUSCULAR | Status: AC

## 2021-11-09 MED ORDER — CHLORHEXIDINE GLUCONATE 0.12 % MT SOLN: 15.0000 mL | Freq: Once | OROMUCOSAL | Status: AC

## 2021-11-09 MED ORDER — PROPOFOL 500 MG/50ML IV EMUL
INTRAVENOUS | Status: DC | PRN
  Administered 2021-11-09: 100 ug/kg/min via INTRAVENOUS

## 2021-11-09 MED ORDER — HEPARIN 6000 UNIT IRRIGATION SOLUTION
Status: DC | PRN
  Administered 2021-11-09: 1

## 2021-11-09 MED ORDER — PROTAMINE SULFATE 10 MG/ML IV SOLN
INTRAVENOUS | Status: DC | PRN
  Administered 2021-11-09: 40 mg via INTRAVENOUS

## 2021-11-09 MED ORDER — FENTANYL CITRATE (PF) 250 MCG/5ML IJ SOLN
INTRAMUSCULAR | Status: DC | PRN
  Administered 2021-11-09: 50 ug via INTRAVENOUS

## 2021-11-09 MED ORDER — FENTANYL CITRATE (PF) 100 MCG/2ML IJ SOLN
INTRAMUSCULAR | Status: AC
  Administered 2021-11-09: 50 ug via INTRAVENOUS
  Filled 2021-11-09: qty 2

## 2021-11-09 MED ORDER — ATORVASTATIN CALCIUM 20 MG PO TABS
20.0000 mg | ORAL_TABLET | Freq: Every day | ORAL | 2 refills | Status: AC
  Filled 2021-11-09: qty 30, 30d supply, fill #0

## 2021-11-09 MED ORDER — PROTAMINE SULFATE 10 MG/ML IV SOLN
INTRAVENOUS | Status: AC
  Filled 2021-11-09: qty 5

## 2021-11-09 MED ORDER — FENTANYL CITRATE (PF) 100 MCG/2ML IJ SOLN: 25.0000 ug | INTRAMUSCULAR | Status: DC | PRN

## 2021-11-09 MED ORDER — PANTOPRAZOLE SODIUM 40 MG PO TBEC
40.0000 mg | DELAYED_RELEASE_TABLET | Freq: Two times a day (BID) | ORAL | 3 refills | Status: DC
  Filled 2021-11-09: qty 30, 15d supply, fill #0

## 2021-11-09 SURGICAL SUPPLY — 34 items
ADH SKN CLS APL DERMABOND .7 (GAUZE/BANDAGES/DRESSINGS) ×1
ARMBAND PINK RESTRICT EXTREMIT (MISCELLANEOUS) ×4 IMPLANT
BAG COUNTER SPONGE SURGICOUNT (BAG) ×2 IMPLANT
BAG SPNG CNTER NS LX DISP (BAG) ×1
CANISTER SUCT 3000ML PPV (MISCELLANEOUS) ×2 IMPLANT
CANNULA VESSEL 3MM 2 BLNT TIP (CANNULA) ×2 IMPLANT
CLIP TI MEDIUM 6 (CLIP) ×1 IMPLANT
CLIP TI WIDE RED SMALL 6 (CLIP) ×2 IMPLANT
COVER PROBE W GEL 5X96 (DRAPES) ×1 IMPLANT
DECANTER SPIKE VIAL GLASS SM (MISCELLANEOUS) ×2 IMPLANT
DERMABOND ADVANCED (GAUZE/BANDAGES/DRESSINGS) ×1
DERMABOND ADVANCED .7 DNX12 (GAUZE/BANDAGES/DRESSINGS) ×1 IMPLANT
ELECT REM PT RETURN 9FT ADLT (ELECTROSURGICAL) ×2
ELECTRODE REM PT RTRN 9FT ADLT (ELECTROSURGICAL) ×1 IMPLANT
GLOVE SRG 8 PF TXTR STRL LF DI (GLOVE) ×1 IMPLANT
GLOVE SURG ENC MOIS LTX SZ7.5 (GLOVE) ×2 IMPLANT
GLOVE SURG POLY ORTHO LF SZ7.5 (GLOVE) IMPLANT
GLOVE SURG UNDER LTX SZ8 (GLOVE) ×2 IMPLANT
GLOVE SURG UNDER POLY LF SZ8 (GLOVE) ×2
GOWN STRL REUS W/ TWL LRG LVL3 (GOWN DISPOSABLE) ×3 IMPLANT
GOWN STRL REUS W/TWL LRG LVL3 (GOWN DISPOSABLE) ×6
KIT BASIN OR (CUSTOM PROCEDURE TRAY) ×2 IMPLANT
KIT TURNOVER KIT B (KITS) ×2 IMPLANT
NS IRRIG 1000ML POUR BTL (IV SOLUTION) ×2 IMPLANT
PACK CV ACCESS (CUSTOM PROCEDURE TRAY) ×2 IMPLANT
PAD ARMBOARD 7.5X6 YLW CONV (MISCELLANEOUS) ×4 IMPLANT
SPONGE SURGIFOAM ABS GEL 100 (HEMOSTASIS) IMPLANT
SUT MNCRL AB 4-0 PS2 18 (SUTURE) ×2 IMPLANT
SUT PROLENE 6 0 BV (SUTURE) ×3 IMPLANT
SUT VIC AB 3-0 SH 27 (SUTURE) ×2
SUT VIC AB 3-0 SH 27X BRD (SUTURE) ×1 IMPLANT
TOWEL GREEN STERILE (TOWEL DISPOSABLE) ×2 IMPLANT
UNDERPAD 30X36 HEAVY ABSORB (UNDERPADS AND DIAPERS) ×2 IMPLANT
WATER STERILE IRR 1000ML POUR (IV SOLUTION) ×2 IMPLANT

## 2021-11-09 NOTE — Anesthesia Postprocedure Evaluation (Signed)
Anesthesia Post Note  Patient: Allen Mathis  Procedure(s) Performed: LEFT ARM ARTERIOVENOUS (AV) FISTULA CREATION  (Left: Arm Lower)     Patient location during evaluation: PACU Anesthesia Type: Regional Level of consciousness: awake and alert, awake and oriented Pain management: pain level controlled Vital Signs Assessment: post-procedure vital signs reviewed and stable Respiratory status: spontaneous breathing, nonlabored ventilation and respiratory function stable Cardiovascular status: stable and blood pressure returned to baseline Postop Assessment: no apparent nausea or vomiting Anesthetic complications: no   No notable events documented.  Last Vitals:  Vitals:   11/09/21 1305 11/09/21 1320  BP: 137/88 (!) 152/77  Pulse: (!) 58 (!) 59  Resp: 14 16  Temp:  36.7 C  SpO2: 97% 100%    Last Pain:  Vitals:   11/09/21 1320  TempSrc:   PainSc: 0-No pain                 Santa Lighter

## 2021-11-09 NOTE — Discharge Instructions (Signed)
[facility]  Discharge Instructions  AV Fistula or Graft Surgery for Dialysis Access  Please refer to the following instructions for your post-procedure care. Your surgeon or physician assistant will discuss any changes with you.  Activity  You may drive the day following your surgery, if you are comfortable and no longer taking prescription pain medication. Resume full activity as the soreness in your incision resolves.  Bathing/Showering  You may shower after you go home. Keep your incision dry for 48 hours. Do not soak in a bathtub, hot tub, or swim until the incision heals completely. You may not shower if you have a hemodialysis catheter.  Incision Care  Clean your incision with mild soap and water after 48 hours. Pat the area dry with a clean towel. You do not need a bandage unless otherwise instructed. Do not apply any ointments or creams to your incision. You may have skin glue on your incision. Do not peel it off. It will come off on its own in about one week. Your arm may swell a bit after surgery. To reduce swelling use pillows to elevate your arm so it is above your heart. Your doctor will tell you if you need to lightly wrap your arm with an ACE bandage.  Diet  Resume your normal diet. There are not special food restrictions following this procedure. In order to heal from your surgery, it is CRITICAL to get adequate nutrition. Your body requires vitamins, minerals, and protein. Vegetables are the best source of vitamins and minerals. Vegetables also provide the perfect balance of protein. Processed food has little nutritional value, so try to avoid this.  Medications  Resume taking all of your medications. If your incision is causing pain, you may take over-the counter pain relievers such as acetaminophen (Tylenol). If you were prescribed a stronger pain medication, please be aware these medications can cause nausea and constipation. Prevent  nausea by taking the medication with a snack or meal. Avoid constipation by drinking plenty of fluids and eating foods with high amount of fiber, such as fruits, vegetables, and grains.  Do not take Tylenol if you are taking prescription pain medications.  Follow up Your surgeon may want to see you in the office following your access surgery. If so, this will be arranged at the time of your surgery.  Please call us immediately for any of the following conditions:  Increased pain, redness, drainage (pus) from your incision site Fever of 101 degrees or higher Severe or worsening pain at your incision site Hand pain or numbness.  Reduce your risk of vascular disease:  Stop smoking. If you would like help, call [facility] at 1-800-QUIT-NOW 2396706536) or [place] [facility] [place] your cholesterol Maintain a desired weight Control your diabetes Keep your blood pressure down  Dialysis  It will take several weeks to several months for your new dialysis access to be ready for use. Your surgeon will determine when it is okay to use it. Your nephrologist will continue to direct your dialysis. You can continue to use your Permcath until your new access is ready for use.   11/09/2021 KRISTOPHER ATTWOOD [id number] [birth year]  Surgeon(s): Angelia Mould, MD  Procedure(s): LEFT ARM ARTERIOVENOUS (AV) FISTULA CREATION VERSUS GRAFT   May stick graft immediately   May stick graft on designated area only:   X Do not stick left AV fistula for 12 weeks    If you have any questions, please call  the office at [phone].

## 2021-11-09 NOTE — Discharge Summary (Addendum)
PATIENT DETAILS Name: Allen Mathis Age: 56 y.o. Sex: male Date of Birth: [birth year] MRN: [id number]. Admitting Physician: Orene Desanctis, DO [id number], Adele Barthel, DO  Admit Date: 11/01/2021 Discharge date: 11/09/2021  Recommendations for Outpatient Follow-up:  Follow up with PCP in 1-2 weeks Please obtain CMP/CBC in one week Please follow follow-up with vascular surgery, gastroenterology and cardiology EGD/colonoscopy biopsy results pending at the time of discharge-please follow  Admitted From:  Home  Disposition: Home   Discharge Condition: good  CODE STATUS:   Code Status: Full Code   Diet recommendation:  Diet Order             Diet - low sodium heart healthy           Diet regular Room service appropriate? 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  He was evaluated by nephrology-deemed to have ESRD and started on HD.  He also was found to have severe anemia-requiring PRBC transfusion.  See below for further details.  Significant events: 1/26>> admit to [facility] respiratory failure due to decompensated CHF in the setting of AKI and worsening anemia.  Significant imaging studies: 1/26>> renal ultrasound: No hydronephrosis 1/26>> CXR: Bilateral pulmonary infiltrates-likely pulmonary edema.  Significant microbiology data: 1/26>> COVID/influenza PCR: Negative 1/26>> urine culture: No growth  Procedures: 1/27>> right IJ tunneled HD catheter by IR 2/2>> EGD/colonoscopy: No obvious bleeding foci 2/4>> left radiocephalic AV fistula  Brief Hospital Course: * Acute respiratory failure with hypoxia due to decompensated combined systolic/diastolic heart failure in the setting of worsening renal function.- (present on admission) Required BiPAP on initial  presentation-significant improvement in volume status/hypoxia with hemodialysis-he is now stable on room air. .  Echo on 1/27 with 45-50% EF.  Acute renal failure on CKD stage IV-now ESRD- (present on admission) Felt to have progression of underlying CKD-now ESRD-nephrology following and managing HD care.  Vascular surgery placed left radiocephalic AV fistula on 2/3.Marland Kitchen  Clipping procedure is complete-he has been accepted at outpatient HD center-TTS schedule-starting this Saturday.  Discussed with nephrologist-Dr. Peoples today-okay to discharge from his point of view.  Atrial fibrillation with RVR ([place])- (present on admission) Maintaining sinus rhythm-has had intermittent brief runs of RVR over the past few days-continue beta-blocker-given concern for GI bleeding/severity of anemia-anticoagulation being avoided.  Per cardiology-if patient developed persistent atrial fibrillation-can revisit issue of anticoagulation.  Please ensure follow-up with cardiology  Multifactorial anemia- (present on admission) Multifactorial anemia due to a combination of CKD-and possible slow GI bleeding (FOBT positive).  Hemoglobin stable-has required PRBC transfusion during this hospitalization.  Nephrology followed closely and managed iron/Aranesp..  Gastroenterology consulted-underwent EGD/colonoscopy which did not show any bleeding foci.    HTN (hypertension)- (present on admission) BP fluctuating-continue metoprolol-further optimization deferred to the outpatient setting.  HLD (hyperlipidemia)- (present on admission) Continue statin.  Nutrition Status: Nutrition Problem: Increased nutrient needs Etiology: chronic illness Signs/Symptoms: estimated needs Interventions: MVI, Nepro shake   BMI: Estimated body mass index is 25.63 kg/m as calculated from the following:   Height as of this encounter: 5\' 5"  (1.651 m).   Weight as of this encounter: 69.9 kg.    Discharge Diagnoses:  Principal Problem:   Acute  respiratory failure with hypoxia due to decompensated  combined systolic/diastolic heart failure in the setting of worsening renal function. Active Problems:   Acute renal failure on CKD stage IV-now ESRD   Atrial fibrillation with RVR (HCC)   Multifactorial anemia   HTN (hypertension)   HLD (hyperlipidemia)   CHF exacerbation (HCC)   High anion gap metabolic acidosis   Discharge Instructions:  Activity:  As tolerated   Discharge Instructions     Call MD for:  difficulty breathing, headache or visual disturbances   Complete by: As directed    Diet - low sodium heart healthy   Complete by: As directed    Discharge instructions   Complete by: As directed    Follow with Primary MD  Lyman Bishop, DO in 1-2 weeks  Follow-up with cardiology in 1-2 weeks  Follow-up with gastroenterology MD-Dr. Collene Mares in 1-2 weeks.  Your EGD/colonoscopy biopsy results are pending and will need to be followed at that time.  Please go to your dialysis center Saturday morning to begin dialysis.  Please get a complete blood count and chemistry panel checked by your Primary MD at your next visit, and again as instructed by your Primary MD.  Get Medicines reviewed and adjusted: Please take all your medications with you for your next visit with your Primary MD  Laboratory/radiological data: Please request your Primary MD to go over all hospital tests and procedure/radiological results at the follow up, please ask your Primary MD to get all Hospital records sent to his/her office.  In some cases, they will be blood work, cultures and biopsy results pending at the time of your discharge. Please request that your primary care M.D. follows up on these results.  Also Note the following: If you experience worsening of your admission symptoms, develop shortness of breath, life threatening emergency, suicidal or homicidal thoughts you must seek medical attention immediately by calling 911 or calling your MD  immediately  if symptoms less severe.  You must read complete instructions/literature along with all the possible adverse reactions/side effects for all the Medicines you take and that have been prescribed to you. Take any new Medicines after you have completely understood and accpet all the possible adverse reactions/side effects.   Do not drive when taking Pain medications or sleeping medications (Benzodaizepines)  Do not take more than prescribed Pain, Sleep and Anxiety Medications. It is not advisable to combine anxiety,sleep and pain medications without talking with your primary care practitioner  Special Instructions: If you have smoked or chewed Tobacco  in the last 2 yrs please stop smoking, stop any regular Alcohol  and or any Recreational drug use.  Wear Seat belts while driving.  Please note: You were cared for by a hospitalist during your hospital stay. Once you are discharged, your primary care physician will handle any further medical issues. Please note that NO REFILLS for any discharge medications will be authorized once you are discharged, as it is imperative that you return to your primary care physician (or establish a relationship with a primary care physician if you do not have one) for your post hospital discharge needs so that they can reassess your need for medications and monitor your lab values.   Increase activity slowly   Complete by: As directed    No wound care   Complete by: As directed       Allergies as of 11/09/2021   No Known Allergies      Medication List     STOP taking these medications  amLODipine 5 MG tablet Commonly known as: NORVASC   Trulicity 4.01 UU/7.2ZD Sopn Generic drug: Dulaglutide       TAKE these medications    aspirin 81 MG EC tablet Take by mouth.   atorvastatin 20 MG tablet Commonly known as: LIPITOR Take 1 tablet (20 mg total) by mouth daily.   metoprolol succinate 25 MG 24 hr tablet Commonly known as:  TOPROL-XL Take 3 tablets (75 mg total) by mouth daily.   multivitamin Tabs tablet Take 1 tablet by mouth at bedtime.   omega-3 acid ethyl esters 1 g capsule Commonly known as: LOVAZA Take 1 g by mouth daily.   pantoprazole 40 MG tablet Commonly known as: PROTONIX Take 1 tablet (40 mg total) by mouth daily.   vitamin B-12 100 MCG tablet Commonly known as: CYANOCOBALAMIN Take 1 tablet (100 mcg total) by mouth daily.        [place] Follow up.   Why: Schedule is Tuesday,Thursday, Saturday.  Patient can start on Saturday. Pt needs to arrive at 6:00 for 6:50 chair time. Contact information: [place] [address] [phone]         [facility] Follow up in 6 week(s).   Why: The office will call the patient with an appointment (sent) Contact information: [address] [place]        Deberah Pelton, NP. Schedule an appointment as soon as possible for a visit on 11/23/2021.   Specialty: Cardiology Why: appointment at 10:05 Contact information: [place] [address] [phone]         Juanita Craver, MD. Schedule an appointment as soon as possible for a visit in 1 week(s).   Specialty: Gastroenterology Contact information: [address], [place] [address] [phone]                No Known Allergies   Other Procedures/Studies: US RENAL  Result Date: 11/01/2021 CLINICAL DATA:  Acute renal insufficiency. EXAM: RENAL / URINARY TRACT ULTRASOUND COMPLETE COMPARISON:  Ultrasound dated 02/23/2021. FINDINGS: Right Kidney: Renal measurements: 9.5 x 4.3 x 8.2 cm = volume: 89 mL. The right kidney is atrophic and echogenic. No hydronephrosis or shadowing stone. Left Kidney: Renal measurements: 9.8 x 3.4 x 4.3 cm = volume: 75 mL. The left kidney is atrophic and echogenic. No  hydronephrosis or shadowing stone. There is a 2.5 cm inferior pole cyst. Bladder: Appears normal for degree of bladder distention. Other: None. IMPRESSION: Echogenic and atrophic kidneys in keeping with chronic kidney disease. No hydronephrosis or shadowing stone. Electronically Signed   By: Anner Crete M.D.   On: 11/01/2021 19:46   IR Fluoro Guide CV Line Right  Result Date: 11/02/2021 INDICATION: 56 year old with end-stage renal disease. Patient needs a catheter for hemodialysis. EXAM: FLUOROSCOPIC AND ULTRASOUND GUIDED PLACEMENT OF A TUNNELED DIALYSIS CATHETER Physician: Stephan Minister. Anselm Pancoast, MD MEDICATIONS: Ancef 2 g; The antibiotic was administered within an appropriate time interval prior to skin puncture. ANESTHESIA/SEDATION: Moderate (conscious) sedation was employed during this procedure. A total of Versed 0.5mg  and fentanyl 25 mcg was administered intravenously at the order of the provider performing the procedure. Total intra-service moderate sedation time: 22 minutes. Patient's level of consciousness and vital signs were monitored continuously by radiology nurse throughout the procedure under the supervision of the provider performing the procedure. FLUOROSCOPY TIME:  Fluoroscopy Time: 1 minute, 12 seconds, 8 mGy  COMPLICATIONS: None immediate. PROCEDURE: The procedure was explained to the patient. The risks and benefits of the procedure were discussed and the patient's questions were addressed. Informed consent was obtained from the patient. The patient was placed supine on the interventional table. Ultrasound confirmed a patent right internal jugular vein. Ultrasound image obtained for documentation. The right neck and chest was prepped and draped in a sterile fashion. Maximal barrier sterile technique was utilized including caps, mask, sterile gowns, sterile gloves, sterile drape, hand hygiene and skin antiseptic. The right neck was anesthetized with 1% lidocaine. A small incision was made with #11  blade scalpel. A 21 gauge needle directed into the right internal jugular vein with ultrasound guidance. A micropuncture dilator set was placed. A 19 cm tip to cuff Palindrome catheter was selected. The skin below the right clavicle was anesthetized and a small incision was made with an #11 blade scalpel. A subcutaneous tunnel was formed to the vein dermatotomy site. The catheter was brought through the tunnel. The vein dermatotomy site was dilated to accommodate a peel-away sheath. The catheter was placed through the peel-away sheath and directed into the central venous structures. The tip of the catheter was placed at superior cavoatrial junction with fluoroscopy. Fluoroscopic images were obtained for documentation. Both lumens were found to aspirate and flush well. The proper amount of heparin was flushed in both lumens. The vein dermatotomy site was closed using a single layer of absorbable suture and Dermabond. Gel-Foam was placed in the subcutaneous tract. The catheter was secured to the skin using Prolene suture. IMPRESSION: Successful placement of a right jugular tunneled dialysis catheter using ultrasound and fluoroscopic guidance. Electronically Signed   By: Markus Daft M.D.   On: 11/02/2021 17:08   DG CHEST PORT 1 VIEW  Result Date: 11/01/2021 CLINICAL DATA:  Proteinuria, dyspnea, diabetes mellitus, smoker EXAM: PORTABLE CHEST 1 VIEW COMPARISON:  Portable exam 1951 hours without priors for comparison FINDINGS: Enlargement of cardiac silhouette. Mediastinal contours normal. Patchy BILATERAL pulmonary infiltrates with bibasilar pleural effusions and atelectasis. No pneumothorax or acute osseous findings. IMPRESSION: BILATERAL pulmonary infiltrates with bibasilar pleural effusions atelectasis associated with cardiomegaly. Findings favor pulmonary edema and CHF though infection is not completely excluded. Electronically Signed   By: Lavonia Dana M.D.   On: 11/01/2021 19:59   ECHOCARDIOGRAM  COMPLETE  Result Date: 11/02/2021    ECHOCARDIOGRAM REPORT   Patient Name:   Allen Mathis Date of Exam: 11/02/2021 Medical Rec #:  [id number]        Height:       65.0 in Accession #:    [id number]       Weight:       166.0 lb Date of Birth:  02/28/1966        BSA:          1.827 m Patient Age:    82 years         BP:           150/82 mmHg Patient Gender: M                HR:           92 bpm. Exam Location:  Inpatient Procedure: 2D Echo, Color Doppler, Cardiac Doppler and Intracardiac            Opacification Agent Indications:    I51.7 Cardiomegaly  History:        Patient has no prior history of Echocardiogram examinations.  Arrythmias:Atrial Fibrillation; Risk Factors:Hypertension and                 Diabetes.  Sonographer:    Raquel Sarna Senior RDCS Referring Phys: [id number] [place]  1. Left ventricular ejection fraction, by estimation, is 45 to 50%. The left ventricle has mildly decreased function. The left ventricle has no regional wall motion abnormalities. Left ventricular diastolic parameters are indeterminate. Elevated left ventricular end-diastolic pressure.  2. Right ventricular systolic function is normal. The right ventricular size is normal.  3. The mitral valve is normal in structure. Mild mitral valve regurgitation. No evidence of mitral stenosis.  4. The aortic valve is normal in structure. Aortic valve regurgitation is not visualized. No aortic stenosis is present.  5. The inferior vena cava is normal in size with greater than 50% respiratory variability, suggesting right atrial pressure of 3 mmHg. FINDINGS  Left Ventricle: Left ventricular ejection fraction, by estimation, is 45 to 50%. The left ventricle has mildly decreased function. The left ventricle has no regional wall motion abnormalities. Definity contrast agent was given IV to delineate the left ventricular endocardial borders. The left ventricular internal cavity size was normal in size. There is no left  ventricular hypertrophy. Left ventricular diastolic parameters are indeterminate. Elevated left ventricular end-diastolic pressure. Right Ventricle: The right ventricular size is normal. No increase in right ventricular wall thickness. Right ventricular systolic function is normal. Left Atrium: Left atrial size was normal in size. Right Atrium: Right atrial size was normal in size. Pericardium: There is no evidence of pericardial effusion. Mitral Valve: The mitral valve is normal in structure. Mild mitral valve regurgitation. No evidence of mitral valve stenosis. Tricuspid Valve: The tricuspid valve is normal in structure. Tricuspid valve regurgitation is not demonstrated. No evidence of tricuspid stenosis. Aortic Valve: The aortic valve is normal in structure. Aortic valve regurgitation is not visualized. No aortic stenosis is present. Pulmonic Valve: The pulmonic valve was normal in structure. Pulmonic valve regurgitation is not visualized. No evidence of pulmonic stenosis. Aorta: The aortic root is normal in size and structure. Venous: The inferior vena cava is normal in size with greater than 50% respiratory variability, suggesting right atrial pressure of 3 mmHg. IAS/Shunts: No atrial level shunt detected by color flow Doppler.  LEFT VENTRICLE PLAX 2D LVIDd:         5.25 cm   Diastology LVIDs:         4.00 cm   LV e' medial:    4.03 cm/s LV PW:         1.00 cm   LV E/e' medial:  28.5 LV IVS:        0.95 cm   LV e' lateral:   8.38 cm/s LVOT diam:     2.20 cm   LV E/e' lateral: 13.7 LV SV:         73 LV SV Index:   40 LVOT Area:     3.80 cm  RIGHT VENTRICLE RV S prime:     15.20 cm/s TAPSE (M-mode): 2.6 cm LEFT ATRIUM             Index        RIGHT ATRIUM           Index LA diam:        4.60 cm 2.52 cm/m   RA Area:     18.00 cm LA Vol (A2C):   72.7 ml 39.78 ml/m  RA Volume:   49.60 ml  27.14  ml/m LA Vol (A4C):   51.3 ml 28.07 ml/m LA Biplane Vol: 62.8 ml 34.37 ml/m  AORTIC VALVE LVOT Vmax:   97.40 cm/s  LVOT Vmean:  73.700 cm/s LVOT VTI:    0.193 m  AORTA Ao Root diam: 3.00 cm MITRAL VALVE MV Area (PHT): 4.93 cm     SHUNTS MV Decel Time: 154 msec     Systemic VTI:  0.19 m MV E velocity: 115.00 cm/s  Systemic Diam: 2.20 cm MV A velocity: 106.00 cm/s MV E/A ratio:  1.08 Fransico Him MD Electronically signed by Fransico Him MD Signature Date/Time: 11/02/2021/12:45:26 PM    Final    VAS [place] UPPER EXT VEIN MAPPING (PRE-OP AVF)  Result Date: 11/07/2021 UPPER EXTREMITY VEIN MAPPING Patient Name:  Allen Mathis  Date of Exam:   11/07/2021 Medical Rec #: [id number]         Accession #:    [id number] Date of Birth: 11/20/1965         Patient Gender: M Patient Age:   84 years Exam Location:  [facility] Procedure:      VAS [place] UPPER EXT VEIN MAPPING (PRE-OP AVF) Referring Phys: Mikeal Hawthorne PEEPLES --------------------------------------------------------------------------------  Indications: Pre-access. History: ESRD.  Comparison Study: No prior studies. Performing Technologist: Darlin Coco RDMS, RVT  Examination Guidelines: A complete evaluation includes B-mode imaging, spectral Doppler, color Doppler, and power Doppler as needed of all accessible portions of each vessel. Bilateral testing is considered an integral part of a complete examination. Limited examinations for reoccurring indications may be performed as noted. +-----------------+-------------+----------+--------+  Right Cephalic    Diameter (cm) Depth (cm) Findings  +-----------------+-------------+----------+--------+  Shoulder              0.39         1.04              +-----------------+-------------+----------+--------+  Prox upper arm        0.37         0.58              +-----------------+-------------+----------+--------+  Mid upper arm         0.41         0.36              +-----------------+-------------+----------+--------+  Dist upper arm        0.42         0.31              +-----------------+-------------+----------+--------+  Antecubital  fossa     0.42         1.04              +-----------------+-------------+----------+--------+  Prox forearm          0.39         0.61              +-----------------+-------------+----------+--------+  Mid forearm           0.32         0.37              +-----------------+-------------+----------+--------+  Dist forearm          0.29         0.24              +-----------------+-------------+----------+--------+  Wrist                 0.17         0.27              +-----------------+-------------+----------+--------+ +-----------------+-------------+----------+----------------------+  Right Basilic     Diameter (cm) Depth (cm)        Findings         +-----------------+-------------+----------+----------------------+  Prox upper arm        0.49                                         +-----------------+-------------+----------+----------------------+  Mid upper arm         0.40                        thrombus         +-----------------+-------------+----------+----------------------+  Dist upper arm        0.55                        thrombus         +-----------------+-------------+----------+----------------------+  Antecubital fossa     0.60                 branching and thrombus  +-----------------+-------------+----------+----------------------+  Prox forearm          0.14                                         +-----------------+-------------+----------+----------------------+  Mid forearm                                    not visualized      +-----------------+-------------+----------+----------------------+ +-----------------+-------------+----------+---------+  Left Cephalic     Diameter (cm) Depth (cm) Findings   +-----------------+-------------+----------+---------+  Shoulder              0.37         0.78               +-----------------+-------------+----------+---------+  Prox upper arm        0.39         0.42               +-----------------+-------------+----------+---------+  Mid upper arm          0.36         0.35               +-----------------+-------------+----------+---------+  Dist upper arm        0.38         0.39               +-----------------+-------------+----------+---------+  Antecubital fossa     0.51         0.44               +-----------------+-------------+----------+---------+  Prox forearm          0.37         0.41               +-----------------+-------------+----------+---------+  Mid forearm           0.38         0.24               +-----------------+-------------+----------+---------+  Dist forearm          0.27         0.27    branching  +-----------------+-------------+----------+---------+  Wrist                 0.26         0.24               +-----------------+-------------+----------+---------+ Summary: Right: Incidental right basilic superficial vein thrombosis.  *See table(s) above for measurements and observations.  Diagnosing physician: Deitra Mayo MD Electronically signed by Deitra Mayo MD on 11/07/2021 at 4:39:19 PM.    Final      TODAY-DAY OF DISCHARGE:  Subjective:   Allen Mathis today has no headache,no chest abdominal pain,no new weakness tingling or numbness, feels much better wants to go home today.   Objective:   Blood pressure (!) 152/77, pulse (!) 59, temperature 98 F (36.7 C), resp. rate 16, height 5\' 5"  (1.651 m), weight 69.9 kg, SpO2 100 %.  Intake/Output Summary (Last 24 hours) at 11/09/2021 1424 Last data filed at 11/09/2021 1226 Gross per 24 hour  Intake 600 ml  Output --  Net 600 ml   Filed Weights   11/08/21 0745 11/08/21 1150 11/09/21 0806  Weight: 70.7 kg 69.2 kg 69.9 kg    Exam: Awake Alert, Oriented *3, No new F.N deficits, Normal affect [place].AT,PERRAL Supple Neck,No JVD, No cervical lymphadenopathy appriciated.  Symmetrical Chest wall movement, Good air movement bilaterally, CTAB RRR,No Gallops,Rubs or new Murmurs, No Parasternal Heave +ve B.Sounds, Abd Soft, Non tender, No organomegaly appriciated,  No rebound -guarding or rigidity. No Cyanosis, Clubbing or edema, No new Rash or bruise   PERTINENT RADIOLOGIC STUDIES: No results found.   PERTINENT LAB RESULTS: CBC: Recent Labs    11/07/21 0229  WBC 7.7  HGB 9.4*  HCT 27.1*  PLT 128*   CMET CMP     Component Value Date/Time   NA 135 11/09/2021 0145   K 3.6 11/09/2021 0145   CL 100 11/09/2021 0145   CO2 24 11/09/2021 0145   GLUCOSE 125 (H) 11/09/2021 0145   BUN 25 (H) 11/09/2021 0145   CREATININE 4.99 (H) 11/09/2021 0145   CALCIUM 8.4 (L) 11/09/2021 0145   PROT 6.8 11/01/2021 1307   ALBUMIN 3.2 (L) 11/09/2021 0145   AST 13 (L) 11/01/2021 1307   ALT 21 11/01/2021 1307   ALKPHOS 67 11/01/2021 1307   BILITOT 0.7 11/01/2021 1307   GFRNONAA 13 (L) 11/09/2021 0145    GFR Estimated Creatinine Clearance: 14.2 mL/min (A) (by C-G formula based on SCr of 4.99 mg/dL (H)). No results for input(s): LIPASE, AMYLASE in the last 72 hours. No results for input(s): CKTOTAL, CKMB, CKMBINDEX, TROPONINI in the last 72 hours. Invalid input(s): POCBNP No results for input(s): DDIMER in the last 72 hours. No results for input(s): HGBA1C in the last 72 hours. No results for input(s): CHOL, HDL, LDLCALC, TRIG, CHOLHDL, LDLDIRECT in the last 72 hours. No results for input(s): TSH, T4TOTAL, T3FREE, THYROIDAB in the last 72 hours.  Invalid input(s): FREET3 No results for input(s): VITAMINB12, FOLATE, FERRITIN, TIBC, IRON, RETICCTPCT in the last 72 hours. Coags: No results for input(s): INR in the last 72 hours.  Invalid input(s): PT Microbiology: Recent Results (from the past 240 hour(s))  Urine Culture     Status: None   Collection Time: 11/01/21  1:08 PM   Specimen: Urine, Clean Catch  Result Value Ref Range Status   Specimen Description   Final    URINE, CLEAN CATCH Performed at [facility], [place] [address] [place], [place] [address]    Special Requests   Final  NONE Performed at [facility], [place] [address], [place], [place] [address]    Culture   Final    NO GROWTH Performed at [facility], [place] [address] [place], [place] [address]    Report Status 11/02/2021 FINAL  Final  Resp Panel by RT-PCR (Flu A&B, Covid) Nasopharyngeal Swab     Status: None   Collection Time: 11/01/21  5:55 PM   Specimen: Nasopharyngeal Swab; Nasopharyngeal(NP) swabs in vial transport medium  Result Value Ref Range Status   SARS Coronavirus 2 by RT PCR NEGATIVE NEGATIVE Final    Comment: (NOTE) SARS-CoV-2 target nucleic acids are NOT DETECTED.  The SARS-CoV-2 RNA is generally detectable in upper respiratory specimens during the acute phase of infection. The lowest concentration of SARS-CoV-2 viral copies this assay can detect is 138 copies/mL. A negative result does not preclude SARS-Cov-2 infection and should not be used as the sole basis for treatment or other patient management decisions. A negative result may occur with  improper specimen collection/handling, submission of specimen other than nasopharyngeal swab, presence of viral mutation(s) within the areas targeted by this assay, and inadequate number of viral copies(<138 copies/mL). A negative result must be combined with clinical observations, patient history, and epidemiological information. The expected result is Negative.  Fact Sheet for Patients:  EntrepreneurPulse.com.au  Fact Sheet for Healthcare Providers:  IncredibleEmployment.be  This test is no t yet approved or cleared by the [place] FDA and  has been authorized for detection and/or diagnosis of SARS-CoV-2 by FDA under an Emergency Use Authorization (EUA). This EUA will remain  in effect (meaning this test can be used) for the duration of the COVID-19 declaration under Section 564(b)(1) of the Act, 21 U.S.C.section 360bbb-3(b)(1), unless the authorization is terminated  or revoked sooner.       Influenza A by  PCR NEGATIVE NEGATIVE Final   Influenza B by PCR NEGATIVE NEGATIVE Final    Comment: (NOTE) The Xpert Xpress SARS-CoV-2/FLU/RSV plus assay is intended as an aid in the diagnosis of influenza from Nasopharyngeal swab specimens and should not be used as a sole basis for treatment. Nasal washings and aspirates are unacceptable for Xpert Xpress SARS-CoV-2/FLU/RSV testing.  Fact Sheet for Patients: EntrepreneurPulse.com.au  Fact Sheet for Healthcare Providers: IncredibleEmployment.be  This test is not yet approved or cleared by the [place] FDA and has been authorized for detection and/or diagnosis of SARS-CoV-2 by FDA under an Emergency Use Authorization (EUA). This EUA will remain in effect (meaning this test can be used) for the duration of the COVID-19 declaration under Section 564(b)(1) of the Act, 21 U.S.C. section 360bbb-3(b)(1), unless the authorization is terminated or revoked.  Performed at [facility], [place] [address] [place], [place] [address]     FURTHER DISCHARGE INSTRUCTIONS:  Get Medicines reviewed and adjusted: Please take all your medications with you for your next visit with your Primary MD  Laboratory/radiological data: Please request your Primary MD to go over all hospital tests and procedure/radiological results at the follow up, please ask your Primary MD to get all Hospital records sent to his/her office.  In some cases, they will be blood work, cultures and biopsy results pending at the time of your discharge. Please request that your primary care M.D. goes through all the records of your hospital data and follows up on these results.  Also Note the following: If you experience worsening of your admission symptoms, develop shortness of breath, life threatening emergency, suicidal  or homicidal thoughts you must seek medical attention immediately by calling 911 or calling your MD immediately  if  symptoms less severe.  You must read complete instructions/literature along with all the possible adverse reactions/side effects for all the Medicines you take and that have been prescribed to you. Take any new Medicines after you have completely understood and accpet all the possible adverse reactions/side effects.   Do not drive when taking Pain medications or sleeping medications (Benzodaizepines)  Do not take more than prescribed Pain, Sleep and Anxiety Medications. It is not advisable to combine anxiety,sleep and pain medications without talking with your primary care practitioner  Special Instructions: If you have smoked or chewed Tobacco  in the last 2 yrs please stop smoking, stop any regular Alcohol  and or any Recreational drug use.  Wear Seat belts while driving.  Please note: You were cared for by a hospitalist during your hospital stay. Once you are discharged, your primary care physician will handle any further medical issues. Please note that NO REFILLS for any discharge medications will be authorized once you are discharged, as it is imperative that you return to your primary care physician (or establish a relationship with a primary care physician if you do not have one) for your post hospital discharge needs so that they can reassess your need for medications and monitor your lab values.  Total Time spent coordinating discharge including counseling, education and face to face time equals greater than 30 minutes.  SignedOren Binet 11/09/2021 2:24 PM

## 2021-11-09 NOTE — Progress Notes (Signed)
Subjective: Tolerated dialysis yesterday with no issues. Had left radiocephalic AVF creation today that went well. No complaints. EGD and colonoscopy yesterday overall reassuring.   Objective Vital signs in last 24 hours: Vitals:   11/09/21 0910 11/09/21 0915 11/09/21 0920 11/09/21 0925  BP: (!) 164/77 (!) 164/77 (!) 151/74 (!) 160/78  Pulse: (!) 58 60 (!) 58 66  Resp: 13 14 18 18   Temp:      TempSrc:      SpO2: 100% 100% 100% 100%  Weight:      Height:       Weight change: 0.71 kg  Intake/Output Summary (Last 24 hours) at 11/09/2021 1250 Last data filed at 11/09/2021 1226 Gross per 24 hour  Intake 600 ml  Output --  Net 600 ml    Assessment/ Plan: Pt is a 56 y.o. yo male who was admitted on 11/01/2021 with uremia and volume overload in the setting of worsening renal failure  Assessment/Plan: 1. Renal-  crt of 4   6 mos ago-  small echogenic kidneys on u/s-  we all suspect that this is just progression of ckd and now ESRD-  Had HD 1/27 and 1/28 with improvement in symptoms.  Unfortunately the patient has had persistently poor kidney function with worsening creatinine in between dialysis and likely ESRD at this time.   -Has TDC in place -Consult [facility] for AVF/G creation -> radiocephalic AVF creation today w/ [facility]. Appreciate help -[facility] 2. HTN/vol-volume status and hypertension much improved.  Can stop lasix and manage volume w/ HD.  Continue metoprolol 3. Anemia-   most likely due to CKD with possible GI blood loss-   iron stores low- repleting and also gave esa-intermittently required transfusion. Hgb stable at 9.4. EGD and colonoscopy okay. Plan to f/u with GI outpatient.  4. Secondary hyperparathyroidism- PTH 265-phosphorus fairly well managed on dialysis  Dispo: The [facility].  Patient is stable to DC today from my perspective to start HD tomorrow outpatient    [place]: Basic Metabolic  Panel: Recent Labs  Lab 11/07/21 0229 11/08/21 0111 11/09/21 0145  NA 138 136 135  K 3.6 3.8 3.6  CL 100 100 100  CO2 25 23 24   GLUCOSE 99 88 125*  BUN 33* 43* 25*  CREATININE 5.61* 7.21* 4.99*  CALCIUM 8.6* 8.7* 8.4*  PHOS 5.1* 6.1* 4.8*   Liver Function Tests: Recent Labs  Lab 11/07/21 0229 11/08/21 0111 11/09/21 0145  ALBUMIN 3.1* 2.9* 3.2*   No results for input(s): LIPASE, AMYLASE in the last 168 hours. No results for input(s): AMMONIA in the last 168 hours. CBC: Recent Labs  Lab 11/03/21 0055 11/04/21 0245 11/05/21 0032 11/06/21 0308 11/07/21 0229  WBC 6.3 6.5 7.3 7.8 7.7  HGB 8.0* 8.0* 7.7* 9.0* 9.4*  HCT 22.5* 21.9* 22.2* 26.2* 27.1*  MCV 82.7 83.3 85.4 83.4 84.2  PLT 113* 120* 125* 134* 128*   Cardiac Enzymes: No results for input(s): CKTOTAL, CKMB, CKMBINDEX, TROPONINI in the last 168 hours. CBG: Recent Labs  Lab 11/09/21 0810  GLUCAP 114*    Iron Studies:  No results for input(s): IRON, TIBC, TRANSFERRIN, FERRITIN in the last 72 hours.  Studies/Results: VAS [place] UPPER EXT VEIN MAPPING (PRE-OP AVF)  Result Date: 11/07/2021 [place] MAPPING Patient Name:  KERIN CECCHI  Date of Exam:   11/07/2021 Medical Rec #: [id number]  Accession #:    [id number] Date of Birth: July 03, 1966         Patient Gender: M Patient Age:   40 years Exam Location:  [facility] Procedure:      VAS [place] UPPER EXT VEIN MAPPING (PRE-OP AVF) Referring Phys: Mikeal Hawthorne Sophy Mesler --------------------------------------------------------------------------------  Indications: Pre-access. History: ESRD.  Comparison Study: No prior studies. Performing Technologist: Darlin Coco RDMS, RVT  Examination Guidelines: A complete evaluation includes B-mode imaging, spectral Doppler, color Doppler, and power Doppler as needed of all accessible portions of each vessel. Bilateral testing is considered an integral part of a complete examination. Limited examinations for reoccurring  indications may be performed as noted. +-----------------+-------------+----------+--------+  Right Cephalic    Diameter (cm) Depth (cm) Findings  +-----------------+-------------+----------+--------+  Shoulder              0.39         1.04              +-----------------+-------------+----------+--------+  Prox upper arm        0.37         0.58              +-----------------+-------------+----------+--------+  Mid upper arm         0.41         0.36              +-----------------+-------------+----------+--------+  Dist upper arm        0.42         0.31              +-----------------+-------------+----------+--------+  Antecubital fossa     0.42         1.04              +-----------------+-------------+----------+--------+  Prox forearm          0.39         0.61              +-----------------+-------------+----------+--------+  Mid forearm           0.32         0.37              +-----------------+-------------+----------+--------+  Dist forearm          0.29         0.24              +-----------------+-------------+----------+--------+  Wrist                 0.17         0.27              +-----------------+-------------+----------+--------+ +-----------------+-------------+----------+----------------------+  Right Basilic     Diameter (cm) Depth (cm)        Findings         +-----------------+-------------+----------+----------------------+  Prox upper arm        0.49                                         +-----------------+-------------+----------+----------------------+  Mid upper arm         0.40                        thrombus         +-----------------+-------------+----------+----------------------+  Dist upper arm        0.55  thrombus         +-----------------+-------------+----------+----------------------+  Antecubital fossa     0.60                 branching and thrombus  +-----------------+-------------+----------+----------------------+  Prox forearm          0.14                                          +-----------------+-------------+----------+----------------------+  Mid forearm                                    not visualized      +-----------------+-------------+----------+----------------------+ +-----------------+-------------+----------+---------+  Left Cephalic     Diameter (cm) Depth (cm) Findings   +-----------------+-------------+----------+---------+  Shoulder              0.37         0.78               +-----------------+-------------+----------+---------+  Prox upper arm        0.39         0.42               +-----------------+-------------+----------+---------+  Mid upper arm         0.36         0.35               +-----------------+-------------+----------+---------+  Dist upper arm        0.38         0.39               +-----------------+-------------+----------+---------+  Antecubital fossa     0.51         0.44               +-----------------+-------------+----------+---------+  Prox forearm          0.37         0.41               +-----------------+-------------+----------+---------+  Mid forearm           0.38         0.24               +-----------------+-------------+----------+---------+  Dist forearm          0.27         0.27    branching  +-----------------+-------------+----------+---------+  Wrist                 0.26         0.24               +-----------------+-------------+----------+---------+ Summary: Right: Incidental right basilic superficial vein thrombosis.  *See table(s) above for measurements and observations.  Diagnosing physician: Deitra Mayo MD Electronically signed by Deitra Mayo MD on 11/07/2021 at 4:39:19 PM.    Final    Medications: Infusions:  sodium chloride 10 mL/hr at 11/09/21 1122     Scheduled Medications:  [MAR Hold] atorvastatin  20 mg Oral Daily   [MAR Hold] Chlorhexidine Gluconate Cloth  6 each Topical Q0600   [MAR Hold] darbepoetin (ARANESP) injection - DIALYSIS  200 mcg Intravenous Q  Tue-HD   [MAR Hold] feeding supplement (NEPRO CARB STEADY)  237 mL Oral BID BM   [MAR Hold] metoprolol succinate  75 mg  Oral Daily   metoprolol succinate       [MAR Hold] multivitamin  1 tablet Oral QHS   [MAR Hold] pantoprazole  40 mg Oral BID AC    have reviewed scheduled and prn medications.  Physical Exam: General: Lying in bed, no distress Heart: RRR Lungs: Bilateral chest rise with no increased work of breathing Abdomen: soft, non distended Extremities: Trace edema in the lower extremities, warm and well perfused Dialysis Access: right sided tunneled HD cath    11/09/2021,12:50 PM  LOS: 8 days

## 2021-11-09 NOTE — Anesthesia Procedure Notes (Signed)
Anesthesia Regional Block: Supraclavicular block   Pre-Anesthetic Checklist: , timeout performed,  Correct Patient, Correct Site, Correct Laterality,  Correct Procedure, Correct Position, site marked,  Risks and benefits discussed,  Surgical consent,  Pre-op evaluation,  At surgeon's request and post-op pain management  Laterality: Left  Prep: chloraprep       Needles:  Injection technique: Single-shot  Needle Type: Echogenic Needle     Needle Length: 9cm  Needle Gauge: 21     Additional Needles:   Procedures:,,,, ultrasound used (permanent image in chart),,    Narrative:  Start time: 11/09/2021 9:20 AM End time: 11/09/2021 9:25 AM Injection made incrementally with aspirations every 5 mL.  Performed by: Personally  Anesthesiologist: Santa Lighter, MD  Additional Notes: No pain on injection. No increased resistance to injection. Injection made in 5cc increments.  Good needle visualization.  Patient tolerated procedure well.

## 2021-11-09 NOTE — Interval H&P Note (Signed)
History and Physical Interval Note:  11/09/2021 10:44 AM  Allen Mathis  has presented today for surgery, with the diagnosis of END STAGE RENAL DISEASE.  The various methods of treatment have been discussed with the patient and family. After consideration of risks, benefits and other options for treatment, the patient has consented to  Procedure(s): LEFT ARM ARTERIOVENOUS (AV) FISTULA CREATION VERSUS GRAFT (Left) as a surgical intervention.  The patient's history has been reviewed, patient examined, no change in status, stable for surgery.  I have reviewed the patient's chart and labs.  Questions were answered to the patient's satisfaction.     Deitra Mayo

## 2021-11-09 NOTE — Op Note (Signed)
° ° °  NAME: Allen Mathis    MRN: [id number] DOB: 1966/07/29    DATE OF OPERATION: 11/09/2021  PREOP DIAGNOSIS:    END-STAGE RENAL DISEASE  POSTOP DIAGNOSIS:    Same  PROCEDURE:    Left radiocephalic AV fistula  SURGEON: Judeth Cornfield. Scot Dock, MD  ASSIST: Miki Kins, RNFA  ANESTHESIA: Axillary block  EBL: Minimal  INDICATIONS:    Allen Mathis is a 56 y.o. male who presents for new access.  He has a functioning right IJ tunneled dialysis catheter.  FINDINGS:   3.5 mm cephalic vein.  2-1/2 mm radial artery  TECHNIQUE:   The patient was taken to the operating room after an axillary block was placed.  The left arm was prepped and draped in usual sterile fashion.  An oblique incision was made in the left wrist and through the incision the cephalic vein was dissected free.  Branches were divided between clips and 3-0 silk ties.  The vein was ligated distally and irrigated up with heparinized saline.  The brachial artery was dissected free beneath the fascia.  The patient was heparinized.  The brachial artery was clamped proximally and distally and a longitudinal arteriotomy was made.  The vein was spatulated and sewn end-to-side to the artery using continuous 6-0 Prolene suture.  At the completion there was a palpable thrill in the fistula.  There was a good radial and ulnar signal with the Doppler.  The heparin was partially reversed with protamine.  The wound was then closed with a deep layer of 3-0 Vicryl and the skin closed with 4-0 Monocryl.  There was one small competing branch.  A small incision was made over this and this was ligated with a 2-0 silk tie.  The skin was closed with a 4-0 Monocryl.  Patient tolerated the procedure well.  He was transferred to recovery room in stable condition.  Given the complexity of the case a first assistant was necessary in order to expedient the procedure and safely perform the technical aspects of the operation.  Deitra Mayo,  MD, FACS [facility]  DATE OF DICTATION:   11/09/2021

## 2021-11-09 NOTE — Transfer of Care (Signed)
Immediate Anesthesia Transfer of Care Note  Patient: Allen Mathis  Procedure(s) Performed: LEFT ARM ARTERIOVENOUS (AV) FISTULA CREATION  (Left: Arm Lower)  Patient Location: PACU  Anesthesia Type:MAC and Regional  Level of Consciousness: awake and alert   Airway & Oxygen Therapy: Patient Spontanous Breathing and Patient connected to face mask oxygen  Post-op Assessment: Report given to RN and Post -op Vital signs reviewed and stable  Post vital signs: Reviewed and stable  Last Vitals:  Vitals Value Taken Time  BP 129/70 11/09/21 1251  Temp    Pulse 60 11/09/21 1251  Resp 10 11/09/21 1254  SpO2 97 % 11/09/21 1251  Vitals shown include unvalidated device data.  Last Pain:  Vitals:   11/09/21 0920  TempSrc:   PainSc: 0-No pain      Patients Stated Pain Goal: 0 (32/02/33 4356)  Complications: No notable events documented.

## 2021-11-09 NOTE — Progress Notes (Signed)
Pt to d/c to home today. Met with pt at bedside to make pt aware that clinic can start pt tomorrow. Pt aware that he needs to arrive at 6:00 tomorrow for paperwork. Contacted [facility] and spoke to [place]. Clinic advised pt will start tomorrow. Contacted renal PA regarding clinic's need for orders.   Melven Sartorius Renal Navigator [phone]

## 2021-11-09 NOTE — Anesthesia Procedure Notes (Addendum)
Procedure Name: MAC Date/Time: 11/09/2021 11:30 AM Performed by: Lieutenant Diego, CRNA Pre-anesthesia Checklist: Patient identified and Emergency Drugs available Patient Re-evaluated:Patient Re-evaluated prior to induction Oxygen Delivery Method: Simple face mask Induction Type: IV induction Placement Confirmation: positive ETCO2 Dental Injury: Teeth and Oropharynx as per pre-operative assessment

## 2021-11-10 ENCOUNTER — Telehealth (HOSPITAL_COMMUNITY): Payer: Self-pay | Admitting: Nephrology

## 2021-11-10 ENCOUNTER — Encounter (HOSPITAL_COMMUNITY): Payer: Self-pay | Admitting: Vascular Surgery

## 2021-11-10 DIAGNOSIS — N2581 Secondary hyperparathyroidism of renal origin: Secondary | ICD-10-CM | POA: Diagnosis not present

## 2021-11-10 DIAGNOSIS — T8249XA Other complication of vascular dialysis catheter, initial encounter: Secondary | ICD-10-CM | POA: Diagnosis not present

## 2021-11-10 DIAGNOSIS — T8249XD Other complication of vascular dialysis catheter, subsequent encounter: Secondary | ICD-10-CM | POA: Diagnosis not present

## 2021-11-10 DIAGNOSIS — T8249XS Other complication of vascular dialysis catheter, sequela: Secondary | ICD-10-CM | POA: Diagnosis not present

## 2021-11-10 DIAGNOSIS — Z992 Dependence on renal dialysis: Secondary | ICD-10-CM | POA: Diagnosis not present

## 2021-11-10 DIAGNOSIS — N186 End stage renal disease: Secondary | ICD-10-CM | POA: Diagnosis not present

## 2021-11-10 NOTE — Telephone Encounter (Signed)
Transition of care contact from inpatient facility  Date of discharge: 11/09/21 Date of contact: 11/10/21 Method: Phone Spoke to: Patient  Patient contacted to discuss transition of care from recent inpatient hospitalization. Patient was admitted to [facility] from 1/26-11/09/21 with discharge diagnosis of AKI on CKD -> ESRD, anemia, A-fib.  Medication changes were reviewed. He has everything, seems clear on dosing. Says he was told that would be given something for pain s/p AVF surgery - nothing is at his pharmacy. Told to use ES Tylenol for now.  Patient will follow up with his/her outpatient HD unit on: TTS schedule - went this morning, no issues. Back on Tuesday.  No other needs identified at this time.  Veneta Penton, PA-C [employer] Pager [phone]

## 2021-11-12 ENCOUNTER — Encounter (HOSPITAL_COMMUNITY): Payer: Self-pay | Admitting: Gastroenterology

## 2021-11-12 LAB — SURGICAL PATHOLOGY

## 2021-11-13 DIAGNOSIS — Z992 Dependence on renal dialysis: Secondary | ICD-10-CM | POA: Diagnosis not present

## 2021-11-13 DIAGNOSIS — N2581 Secondary hyperparathyroidism of renal origin: Secondary | ICD-10-CM | POA: Diagnosis not present

## 2021-11-13 DIAGNOSIS — T8249XD Other complication of vascular dialysis catheter, subsequent encounter: Secondary | ICD-10-CM | POA: Diagnosis not present

## 2021-11-13 DIAGNOSIS — J9600 Acute respiratory failure, unspecified whether with hypoxia or hypercapnia: Secondary | ICD-10-CM | POA: Diagnosis not present

## 2021-11-13 DIAGNOSIS — I4891 Unspecified atrial fibrillation: Secondary | ICD-10-CM | POA: Diagnosis not present

## 2021-11-13 DIAGNOSIS — T8249XS Other complication of vascular dialysis catheter, sequela: Secondary | ICD-10-CM | POA: Diagnosis not present

## 2021-11-13 DIAGNOSIS — N186 End stage renal disease: Secondary | ICD-10-CM | POA: Diagnosis not present

## 2021-11-13 DIAGNOSIS — T8249XA Other complication of vascular dialysis catheter, initial encounter: Secondary | ICD-10-CM | POA: Diagnosis not present

## 2021-11-15 DIAGNOSIS — N186 End stage renal disease: Secondary | ICD-10-CM | POA: Diagnosis not present

## 2021-11-15 DIAGNOSIS — T8249XD Other complication of vascular dialysis catheter, subsequent encounter: Secondary | ICD-10-CM | POA: Diagnosis not present

## 2021-11-15 DIAGNOSIS — Z992 Dependence on renal dialysis: Secondary | ICD-10-CM | POA: Diagnosis not present

## 2021-11-15 DIAGNOSIS — N2581 Secondary hyperparathyroidism of renal origin: Secondary | ICD-10-CM | POA: Diagnosis not present

## 2021-11-15 DIAGNOSIS — T8249XA Other complication of vascular dialysis catheter, initial encounter: Secondary | ICD-10-CM | POA: Diagnosis not present

## 2021-11-15 DIAGNOSIS — T8249XS Other complication of vascular dialysis catheter, sequela: Secondary | ICD-10-CM | POA: Diagnosis not present

## 2021-11-17 DIAGNOSIS — Z992 Dependence on renal dialysis: Secondary | ICD-10-CM | POA: Diagnosis not present

## 2021-11-17 DIAGNOSIS — T8249XA Other complication of vascular dialysis catheter, initial encounter: Secondary | ICD-10-CM | POA: Diagnosis not present

## 2021-11-17 DIAGNOSIS — N186 End stage renal disease: Secondary | ICD-10-CM | POA: Diagnosis not present

## 2021-11-17 DIAGNOSIS — N2581 Secondary hyperparathyroidism of renal origin: Secondary | ICD-10-CM | POA: Diagnosis not present

## 2021-11-17 DIAGNOSIS — T8249XD Other complication of vascular dialysis catheter, subsequent encounter: Secondary | ICD-10-CM | POA: Diagnosis not present

## 2021-11-17 DIAGNOSIS — T8249XS Other complication of vascular dialysis catheter, sequela: Secondary | ICD-10-CM | POA: Diagnosis not present

## 2021-11-19 DIAGNOSIS — E782 Mixed hyperlipidemia: Secondary | ICD-10-CM | POA: Diagnosis not present

## 2021-11-19 DIAGNOSIS — N186 End stage renal disease: Secondary | ICD-10-CM | POA: Diagnosis not present

## 2021-11-19 DIAGNOSIS — Z992 Dependence on renal dialysis: Secondary | ICD-10-CM | POA: Diagnosis not present

## 2021-11-19 DIAGNOSIS — I10 Essential (primary) hypertension: Secondary | ICD-10-CM | POA: Diagnosis not present

## 2021-11-20 DIAGNOSIS — Z23 Encounter for immunization: Secondary | ICD-10-CM | POA: Diagnosis not present

## 2021-11-20 DIAGNOSIS — N2581 Secondary hyperparathyroidism of renal origin: Secondary | ICD-10-CM | POA: Diagnosis not present

## 2021-11-20 DIAGNOSIS — Z992 Dependence on renal dialysis: Secondary | ICD-10-CM | POA: Diagnosis not present

## 2021-11-20 DIAGNOSIS — N186 End stage renal disease: Secondary | ICD-10-CM | POA: Diagnosis not present

## 2021-11-20 DIAGNOSIS — T8249XA Other complication of vascular dialysis catheter, initial encounter: Secondary | ICD-10-CM | POA: Diagnosis not present

## 2021-11-20 DIAGNOSIS — T8249XS Other complication of vascular dialysis catheter, sequela: Secondary | ICD-10-CM | POA: Diagnosis not present

## 2021-11-20 DIAGNOSIS — T8249XD Other complication of vascular dialysis catheter, subsequent encounter: Secondary | ICD-10-CM | POA: Diagnosis not present

## 2021-11-21 NOTE — Progress Notes (Deleted)
Cardiology Office Note:    Date:  11/23/2021   ID:  DEVANSH RIESE, DOB [birth year], MRN [id number]  PCP:  Lyman Bishop, DO   [facility] Providers Cardiologist:  Candee Furbish, MD      Referring MD: Lyman Bishop, DO   Follow-up for CHF and atrial fibrillation   History of Present Illness:    Allen Mathis is a 56 y.o. male with a hx of hyperlipidemia, anemia, stage IV CKD/ESRD, acute respiratory failure, CHF, atrial fibrillation, hypertension, type 2 diabetes, and GERD.  He was admitted to the hospital 11/02/2021 and discharged on 11/09/2021.  He presented with decompensated combined systolic and diastolic CHF in the setting of AKI.  He was seen by nephrology and felt to have end-stage renal disease.  He was started on hemodialysis.  He was also found to have severe anemia and required PRBCs.  He was seen by GI and underwent EGD/colonoscopy and was not found to have active bleeding.  An echocardiogram 11/02/2021 showed an LVEF of 45-50%.  He initially required BiPAP however, post HD he was transitioned to room air and tolerated well.  Vascular surgery placed a AV fistula on 11/09/2021.  He was also noted to have atrial fibrillation with RVR.  At discharge she was noted to be in sinus rhythm.  He was not placed on anticoagulation due to concern for GI bleeding and severe anemia.  Also he was not noted to have persistent atrial fibrillation.  Cardiology was consulted and recommended avoiding anticoagulation unless he was noted to have persistent atrial fibrillation.  He received iron and /Aranesp per nephrology for his anemia.  His metoprolol was continued and he was discharged in stable condition.  He presents to the clinic today for follow-up evaluation states he feels well.  He has been going to dialysis on Tuesday Thursday and Saturday.  He has been walking on the days that he does not get a dialysis.  He reports that he is eating a heart healthy low-sodium diet.  He is also  being treated for what sounds like H. Pylori from GI.  He reports that he no longer notices a popping sensation in his ear or irregular heartbeats.  We reviewed his recent hospitalization and he expressed understanding.  He has not had any further trouble with his breathing, lower extremity swelling, or noticed any bleeding.  His EKG today shows sinus bradycardia 55 bpm.  I will have him continue his increased physical activity, continue his current diet, order a 30-day cardiac event monitor, and plan follow-up for 2 to 3 months.  Today he denies chest pain, shortness of breath, lower extremity edema, fatigue, palpitations, melena, hematuria, hemoptysis, diaphoresis, weakness, presyncope, syncope, orthopnea, and PND.   Past Medical History:  Diagnosis Date   Asthma    Chronic kidney disease    COPD (chronic obstructive pulmonary disease) ([place])    Diabetes mellitus without complication ([place])    Hypertension     Past Surgical History:  Procedure Laterality Date   AV FISTULA PLACEMENT Left 11/09/2021   Procedure: LEFT ARM ARTERIOVENOUS (AV) FISTULA CREATION ;  Surgeon: Angelia Mould, MD;  Location: [place];  Service: Vascular;  Laterality: Left;   BIOPSY  11/08/2021   Procedure: BIOPSY;  Surgeon: Carol Ada, MD;  Location: [place];  Service: Endoscopy;;   COLONOSCOPY WITH PROPOFOL N/A 11/08/2021   Procedure: COLONOSCOPY WITH PROPOFOL;  Surgeon: Carol Ada, MD;  Location: [place];  Service: Endoscopy;  Laterality: N/A;  ESOPHAGOGASTRODUODENOSCOPY (EGD) WITH PROPOFOL N/A 11/08/2021   Procedure: ESOPHAGOGASTRODUODENOSCOPY (EGD) WITH PROPOFOL;  Surgeon: Carol Ada, MD;  Location: [place];  Service: Endoscopy;  Laterality: N/A;   IR FLUORO GUIDE CV LINE RIGHT  11/02/2021   POLYPECTOMY  11/08/2021   Procedure: POLYPECTOMY;  Surgeon: Carol Ada, MD;  Location: [place];  Service: Endoscopy;;    Current Medications: Current Meds  Medication Sig   aspirin 81 MG EC  tablet Take by mouth.   atorvastatin (LIPITOR) 20 MG tablet Take 1 tablet (20 mg total) by mouth daily.   metoprolol succinate (TOPROL-XL) 25 MG 24 hr tablet Take 25 mg by mouth daily.   multivitamin (RENA-VIT) TABS tablet Take 1 tablet by mouth at bedtime.   omega-3 acid ethyl esters (LOVAZA) 1 g capsule Take 1 g by mouth daily.   pantoprazole (PROTONIX) 40 MG tablet Take 1 tablet (40 mg total) by mouth daily.   vitamin B-12 (CYANOCOBALAMIN) 100 MCG tablet Take 1 tablet (100 mcg total) by mouth daily.     Allergies:   Patient has no known allergies.   Social History   Socioeconomic History   Marital status: Single    Spouse name: Not on file   Number of children: Not on file   Years of education: Not on file   Highest education level: Not on file  Occupational History   Not on file  Tobacco Use   Smoking status: Former    Types: Cigarettes   Smokeless tobacco: Current    Types: Snuff  Vaping Use   Vaping Use: Never used  Substance and Sexual Activity   Alcohol use: Yes   Drug use: Yes    Types: Marijuana   Sexual activity: Not on file  Other Topics Concern   Not on file  Social History Narrative   Not on file   Social Determinants of Health   Financial Resource Strain: Not on file  Food Insecurity: Not on file  Transportation Needs: Not on file  Physical Activity: Not on file  Stress: Not on file  Social Connections: Not on file     Family History: The patient's family history includes Diabetes in his father and mother.  ROS:   Please see the history of present illness.     All other systems reviewed and are negative.   Risk Assessment/Calculations:          Physical Exam:    VS:  BP 122/60 (BP Location: Right Arm, Patient Position: Sitting, Cuff Size: Normal)    Pulse (!) 55    Ht 5\' 5"  (1.651 m)    Wt 163 lb 1.6 oz (74 kg)    BMI 27.14 kg/m     Wt Readings from Last 3 Encounters:  11/23/21 163 lb 1.6 oz (74 kg)  11/09/21 154 lb (69.9 kg)      GEN:  Well nourished, well developed in no acute distress HEENT: Normal NECK: No JVD; No carotid bruits LYMPHATICS: No lymphadenopathy CARDIAC: RRR, no murmurs, rubs, gallops RESPIRATORY:  Clear to auscultation without rales, wheezing or rhonchi  ABDOMEN: Soft, non-tender, non-distended MUSCULOSKELETAL:  No edema; No deformity  SKIN: Warm and dry NEUROLOGIC:  Alert and oriented x 3 PSYCHIATRIC:  Normal affect    EKGs/Labs/Other Studies Reviewed:    The following studies were reviewed today:  Echocardiogram 11/02/2021 IMPRESSIONS     1. Left ventricular ejection fraction, by estimation, is 45 to 50%. The  left ventricle has mildly decreased function. The left ventricle has no  regional wall motion abnormalities. Left ventricular diastolic parameters  are indeterminate. Elevated left  ventricular end-diastolic pressure.   2. Right ventricular systolic function is normal. The right ventricular  size is normal.   3. The mitral valve is normal in structure. Mild mitral valve  regurgitation. No evidence of mitral stenosis.   4. The aortic valve is normal in structure. Aortic valve regurgitation is  not visualized. No aortic stenosis is present.   5. The inferior vena cava is normal in size with greater than 50%  respiratory variability, suggesting right atrial pressure of 3 mmHg.   FINDINGS   Left Ventricle: Left ventricular ejection fraction, by estimation, is 45  to 50%. The left ventricle has mildly decreased function. The left  ventricle has no regional wall motion abnormalities. Definity contrast  agent was given IV to delineate the left  ventricular endocardial borders. The left ventricular internal cavity size  was normal in size. There is no left ventricular hypertrophy. Left  ventricular diastolic parameters are indeterminate. Elevated left  ventricular end-diastolic pressure.   Right Ventricle: The right ventricular size is normal. No increase in  right  ventricular wall thickness. Right ventricular systolic function is  normal.   Left Atrium: Left atrial size was normal in size.   Right Atrium: Right atrial size was normal in size.   Pericardium: There is no evidence of pericardial effusion.   Mitral Valve: The mitral valve is normal in structure. Mild mitral valve  regurgitation. No evidence of mitral valve stenosis.   Tricuspid Valve: The tricuspid valve is normal in structure. Tricuspid  valve regurgitation is not demonstrated. No evidence of tricuspid  stenosis.   Aortic Valve: The aortic valve is normal in structure. Aortic valve  regurgitation is not visualized. No aortic stenosis is present.   Pulmonic Valve: The pulmonic valve was normal in structure. Pulmonic valve  regurgitation is not visualized. No evidence of pulmonic stenosis.   Aorta: The aortic root is normal in size and structure.   Venous: The inferior vena cava is normal in size with greater than 50%  respiratory variability, suggesting right atrial pressure of 3 mmHg.   IAS/Shunts: No atrial level shunt detected by color flow Doppler.    EKG:  EKG is  ordered today.  The ekg ordered today demonstrates sinus bradycardia 55 bpm  Recent Labs: 11/01/2021: ALT 21; B Natriuretic Peptide 933.7 11/02/2021: TSH 1.770 11/07/2021: Hemoglobin 9.4; Platelets 128 11/09/2021: BUN 25; Creatinine, Ser 4.99; Potassium 3.6; Sodium 135  Recent Lipid Panel No results found for: CHOL, TRIG, HDL, CHOLHDL, VLDL, LDLCALC, LDLDIRECT  ASSESSMENT & PLAN    Atrial fibrillation-EKG today shows sinus bradycardia 55 bpm.  Previous recommendation was to hold off on oral anticoagulation due to history/concern for GI bleed and severe anemia.  Underwent endoscopy/colonoscopy and was not noted to have active bleeding. I will order a cardiac event monitor to rule out recurrence of atrial fibrillation. Continue metoprolol, aspirin Heart healthy low-sodium diet Increase physical activity as  tolerated Avoid triggers caffeine, chocolate, EtOH, dehydration etc. 30-day cardiac event monitor  Essential hypertension-BP today 122/60.  Well-controlled at home.Reports that due to decreased blood pressure during dialysis he is now taking only 25 mg of metoprolol daily instead of 75. Continue metoprolol Heart healthy low-sodium diet-salty 6 given Increase physical activity as tolerated  Chronic combined systolic and diastolic CHF-euvolemic today.  No increased DOE or activity intolerance.  Echocardiogram 11/02/2021 showed LVEF 45-50%, and intermediate diastolic parameters. Continue metoprolol Heart healthy low-sodium diet-salty 6  given Increase physical activity as tolerated Daily weights-contact office with a weight increase of 3 pounds overnight or 5 pounds in a week Elevate lower extremities when not active  Hyperlipidemia-Compliant with Lipitor. Denies side effects Continue atorvastatin Heart healthy low-sodium high-fiber diet Increase physical activity as tolerated  CKD stage IV/ESRD-received hemodialysis during recent admission.  Had AV fistula placed on 11/09/2021. Follows with nephrology  Disposition: Follow-up with Dr. Marlou Porch or APP in 3-4 months.       Medication Adjustments/Labs and Tests Ordered: Current medicines are reviewed at length with the patient today.  Concerns regarding medicines are outlined above.  Orders Placed This Encounter  Procedures   CARDIAC EVENT MONITOR   EKG 12-Lead   No orders of the defined types were placed in this encounter.   Patient Instructions  Medication Instructions:  Continue current medications  *If you need a refill on your cardiac medications before your next appointment, please call your pharmacy*   Lab Work: None Ordered   Testing/Procedures: Your physician has recommended that you wear an event monitor for 30 days. Event monitors are medical devices that record the hearts electrical activity. Doctors most often [place]  these monitors to diagnose arrhythmias. Arrhythmias are problems with the speed or rhythm of the heartbeat. The monitor is a small, portable device. You can wear one while you do your normal daily activities. This is usually used to diagnose what is causing palpitations/syncope (passing out).   Follow-Up: At [facility], you and your health needs are our priority.  As part of our continuing mission to provide you with exceptional heart care, we have created designated Provider Care Teams.  These Care Teams include your primary Cardiologist (physician) and Advanced Practice Providers (APPs -  Physician Assistants and Nurse Practitioners) who all work together to provide you with the care you need, when you need it.  We recommend signing up for the patient portal called "MyChart".  Sign up information is provided on this After Visit Summary.  MyChart is used to connect with patients for Virtual Visits (Telemedicine).  Patients are able to view lab/test results, encounter notes, upcoming appointments, etc.  Non-urgent messages can be sent to your provider as well.   To learn more about what you can do with MyChart, go to [email or web address].    Your next appointment:   3 month(s)  The format for your next appointment:   In Person  Provider:   Dr Skains:ot Or 1}or   Other Instructions Exercise recommendations: The American Heart Association recommends 150 minutes of moderate intensity exercise weekly. Try 30 minutes of moderate intensity exercise 4-5 times per week. This could include walking, jogging, or swimming.        Signed, Deberah Pelton, NP  11/23/2021 10:35 AM      Notice: This dictation was prepared with Dragon dictation along with smaller phrase technology. Any transcriptional errors that result from this process are unintentional and may not be corrected upon review.  I spent 14 minutes examining this patient, reviewing medications, and using patient centered  shared decision making involving her cardiac care.  Prior to her visit I spent greater than 20 minutes reviewing her past medical history,  medications, and prior cardiac tests.

## 2021-11-22 DIAGNOSIS — Z992 Dependence on renal dialysis: Secondary | ICD-10-CM | POA: Diagnosis not present

## 2021-11-22 DIAGNOSIS — T8249XS Other complication of vascular dialysis catheter, sequela: Secondary | ICD-10-CM | POA: Diagnosis not present

## 2021-11-22 DIAGNOSIS — T8249XD Other complication of vascular dialysis catheter, subsequent encounter: Secondary | ICD-10-CM | POA: Diagnosis not present

## 2021-11-22 DIAGNOSIS — T8249XA Other complication of vascular dialysis catheter, initial encounter: Secondary | ICD-10-CM | POA: Diagnosis not present

## 2021-11-22 DIAGNOSIS — N186 End stage renal disease: Secondary | ICD-10-CM | POA: Diagnosis not present

## 2021-11-22 DIAGNOSIS — N2581 Secondary hyperparathyroidism of renal origin: Secondary | ICD-10-CM | POA: Diagnosis not present

## 2021-11-22 DIAGNOSIS — Z23 Encounter for immunization: Secondary | ICD-10-CM | POA: Diagnosis not present

## 2021-11-23 ENCOUNTER — Ambulatory Visit (HOSPITAL_BASED_OUTPATIENT_CLINIC_OR_DEPARTMENT_OTHER): Payer: BC Managed Care – PPO | Admitting: General Practice

## 2021-11-23 ENCOUNTER — Other Ambulatory Visit: Payer: Self-pay

## 2021-11-23 ENCOUNTER — Encounter (HOSPITAL_BASED_OUTPATIENT_CLINIC_OR_DEPARTMENT_OTHER): Payer: Self-pay | Admitting: General Practice

## 2021-11-23 VITALS — BP 122/60 | HR 55 | Ht 65.0 in | Wt 163.1 lb

## 2021-11-23 DIAGNOSIS — I5042 Chronic combined systolic (congestive) and diastolic (congestive) heart failure: Secondary | ICD-10-CM | POA: Diagnosis not present

## 2021-11-23 DIAGNOSIS — I480 Paroxysmal atrial fibrillation: Secondary | ICD-10-CM

## 2021-11-23 DIAGNOSIS — E782 Mixed hyperlipidemia: Secondary | ICD-10-CM

## 2021-11-23 DIAGNOSIS — N184 Chronic kidney disease, stage 4 (severe): Secondary | ICD-10-CM

## 2021-11-23 DIAGNOSIS — I10 Essential (primary) hypertension: Secondary | ICD-10-CM

## 2021-11-23 NOTE — Patient Instructions (Addendum)
Medication Instructions:  Continue current medications  *If you need a refill on your cardiac medications before your next appointment, please call your pharmacy*   Lab Work: None Ordered   Testing/Procedures: Your physician has recommended that you wear an event monitor for 30 days. Event monitors are medical devices that record the hearts electrical activity. Doctors most often [place] these monitors to diagnose arrhythmias. Arrhythmias are problems with the speed or rhythm of the heartbeat. The monitor is a small, portable device. You can wear one while you do your normal daily activities. This is usually used to diagnose what is causing palpitations/syncope (passing out).   Follow-Up: At [facility], you and your health needs are our priority.  As part of our continuing mission to provide you with exceptional heart care, we have created designated Provider Care Teams.  These Care Teams include your primary Cardiologist (physician) and Advanced Practice Providers (APPs -  Physician Assistants and Nurse Practitioners) who all work together to provide you with the care you need, when you need it.  We recommend signing up for the patient portal called "MyChart".  Sign up information is provided on this After Visit Summary.  MyChart is used to connect with patients for Virtual Visits (Telemedicine).  Patients are able to view lab/test results, encounter notes, upcoming appointments, etc.  Non-urgent messages can be sent to your provider as well.   To learn more about what you can do with MyChart, go to [email or web address].    Your next appointment:   3 month(s)  The format for your next appointment:   In Person  Provider:   Dr Skains:ot Or 1}or   Other Instructions Exercise recommendations: The American Heart Association recommends 150 minutes of moderate intensity exercise weekly. Try 30 minutes of moderate intensity exercise 4-5 times per week. This could include walking,  jogging, or swimming.

## 2021-11-23 NOTE — Progress Notes (Signed)
Cardiology Office Note:    Date:  11/23/2021   ID:  Allen Mathis, DOB 09/08/1966, MRN [id number]  PCP:  Lyman Bishop, DO   [facility] Providers Cardiologist:  None      Referring MD: Lyman Bishop, DO   Follow-up for CHF and atrial fibrillation   History of Present Illness:    Allen Mathis is a 56 y.o. male with a hx of hyperlipidemia, anemia, stage IV CKD/ESRD, acute respiratory failure, CHF, atrial fibrillation, hypertension, type 2 diabetes, and GERD.  He was admitted to the hospital 11/02/2021 and discharged on 11/09/2021.  He presented with decompensated combined systolic and diastolic CHF in the setting of AKI.  He was seen by nephrology and felt to have end-stage renal disease.  He was started on hemodialysis.  He was also found to have severe anemia and required PRBCs.  He was seen by GI and underwent EGD/colonoscopy and was not found to have active bleeding.  An echocardiogram 11/02/2021 showed an LVEF of 45-50%.  He initially required BiPAP however, post HD he was transitioned to room air and tolerated well.  Vascular surgery placed a AV fistula on 11/09/2021.  He was also noted to have atrial fibrillation with RVR.  At discharge she was noted to be in sinus rhythm.  He was not placed on anticoagulation due to concern for GI bleeding and severe anemia.  Also he was not noted to have persistent atrial fibrillation.  Cardiology was consulted and recommended avoiding anticoagulation unless he was noted to have persistent atrial fibrillation.  He received iron and /Aranesp per nephrology for his anemia.  His metoprolol was continued and he was discharged in stable condition.  He presents to the clinic today for follow-up evaluation states he feels well.  He has been going to dialysis on Tuesday Thursday and Saturday.  He has been walking on the days that he does not get a dialysis.  He reports that he is eating a heart healthy low-sodium diet.  He is also being treated  for what sounds like H. Pylori from GI.  He reports that he no longer notices a popping sensation in his ear or irregular heartbeats.  We reviewed his recent hospitalization and he expressed understanding.  He has not had any further trouble with his breathing, lower extremity swelling, or noticed any bleeding.  His EKG today shows sinus bradycardia 55 bpm.  I will have him continue his increased physical activity, continue his current diet, order a 30-day cardiac event monitor, and plan follow-up for 2 to 3 months.  Today denies chest pain, shortness of breath, lower extremity edema, fatigue, palpitations, melena, hematuria, hemoptysis, diaphoresis, weakness, presyncope, syncope, orthopnea, and PND.   Past Medical History:  Diagnosis Date   Asthma    Chronic kidney disease    COPD (chronic obstructive pulmonary disease) ([place])    Diabetes mellitus without complication ([place])    Hypertension     Past Surgical History:  Procedure Laterality Date   AV FISTULA PLACEMENT Left 11/09/2021   Procedure: LEFT ARM ARTERIOVENOUS (AV) FISTULA CREATION ;  Surgeon: Angelia Mould, MD;  Location: [place];  Service: Vascular;  Laterality: Left;   BIOPSY  11/08/2021   Procedure: BIOPSY;  Surgeon: Carol Ada, MD;  Location: [place];  Service: Endoscopy;;   COLONOSCOPY WITH PROPOFOL N/A 11/08/2021   Procedure: COLONOSCOPY WITH PROPOFOL;  Surgeon: Carol Ada, MD;  Location: [place];  Service: Endoscopy;  Laterality: N/A;   ESOPHAGOGASTRODUODENOSCOPY (EGD)  WITH PROPOFOL N/A 11/08/2021   Procedure: ESOPHAGOGASTRODUODENOSCOPY (EGD) WITH PROPOFOL;  Surgeon: Carol Ada, MD;  Location: [place];  Service: Endoscopy;  Laterality: N/A;   IR FLUORO GUIDE CV LINE RIGHT  11/02/2021   POLYPECTOMY  11/08/2021   Procedure: POLYPECTOMY;  Surgeon: Carol Ada, MD;  Location: [place];  Service: Endoscopy;;    Current Medications: Current Meds  Medication Sig   aspirin 81 MG EC tablet Take by mouth.    atorvastatin (LIPITOR) 20 MG tablet Take 1 tablet (20 mg total) by mouth daily.   metoprolol succinate (TOPROL-XL) 25 MG 24 hr tablet Take 25 mg by mouth daily.   multivitamin (RENA-VIT) TABS tablet Take 1 tablet by mouth at bedtime.   omega-3 acid ethyl esters (LOVAZA) 1 g capsule Take 1 g by mouth daily.   pantoprazole (PROTONIX) 40 MG tablet Take 1 tablet (40 mg total) by mouth daily.   vitamin B-12 (CYANOCOBALAMIN) 100 MCG tablet Take 1 tablet (100 mcg total) by mouth daily.     Allergies:   Patient has no known allergies.   Social History   Socioeconomic History   Marital status: Single    Spouse name: Not on file   Number of children: Not on file   Years of education: Not on file   Highest education level: Not on file  Occupational History   Not on file  Tobacco Use   Smoking status: Former    Types: Cigarettes   Smokeless tobacco: Current    Types: Snuff  Vaping Use   Vaping Use: Never used  Substance and Sexual Activity   Alcohol use: Yes   Drug use: Yes    Types: Marijuana   Sexual activity: Not on file  Other Topics Concern   Not on file  Social History Narrative   Not on file   Social Determinants of Health   Financial Resource Strain: Not on file  Food Insecurity: Not on file  Transportation Needs: Not on file  Physical Activity: Not on file  Stress: Not on file  Social Connections: Not on file     Family History: The patient's family history includes Diabetes in his father and mother.  ROS:   Please see the history of present illness.     All other systems reviewed and are negative.   Risk Assessment/Calculations:           Physical Exam:    VS:  BP 122/60 (BP Location: Right Arm, Patient Position: Sitting, Cuff Size: Normal)    Pulse (!) 55    Ht 5\' 5"  (1.651 m)    Wt 163 lb 1.6 oz (74 kg)    BMI 27.14 kg/m     Wt Readings from Last 3 Encounters:  11/23/21 163 lb 1.6 oz (74 kg)  11/09/21 154 lb (69.9 kg)     GEN:  Well nourished,  well developed in no acute distress HEENT: Normal NECK: No JVD; No carotid bruits LYMPHATICS: No lymphadenopathy CARDIAC: RRR, no murmurs, rubs, gallops RESPIRATORY:  Clear to auscultation without rales, wheezing or rhonchi  ABDOMEN: Soft, non-tender, non-distended MUSCULOSKELETAL:  No edema; No deformity  SKIN: Warm and dry NEUROLOGIC:  Alert and oriented x 3 PSYCHIATRIC:  Normal affect    EKGs/Labs/Other Studies Reviewed:    The following studies were reviewed today:  Echocardiogram 11/02/2021 IMPRESSIONS     1. Left ventricular ejection fraction, by estimation, is 45 to 50%. The  left ventricle has mildly decreased function. The left ventricle has no  regional wall motion abnormalities. Left ventricular diastolic parameters  are indeterminate. Elevated left  ventricular end-diastolic pressure.   2. Right ventricular systolic function is normal. The right ventricular  size is normal.   3. The mitral valve is normal in structure. Mild mitral valve  regurgitation. No evidence of mitral stenosis.   4. The aortic valve is normal in structure. Aortic valve regurgitation is  not visualized. No aortic stenosis is present.   5. The inferior vena cava is normal in size with greater than 50%  respiratory variability, suggesting right atrial pressure of 3 mmHg.   FINDINGS   Left Ventricle: Left ventricular ejection fraction, by estimation, is 45  to 50%. The left ventricle has mildly decreased function. The left  ventricle has no regional wall motion abnormalities. Definity contrast  agent was given IV to delineate the left  ventricular endocardial borders. The left ventricular internal cavity size  was normal in size. There is no left ventricular hypertrophy. Left  ventricular diastolic parameters are indeterminate. Elevated left  ventricular end-diastolic pressure.   Right Ventricle: The right ventricular size is normal. No increase in  right ventricular wall thickness. Right  ventricular systolic function is  normal.   Left Atrium: Left atrial size was normal in size.   Right Atrium: Right atrial size was normal in size.   Pericardium: There is no evidence of pericardial effusion.   Mitral Valve: The mitral valve is normal in structure. Mild mitral valve  regurgitation. No evidence of mitral valve stenosis.   Tricuspid Valve: The tricuspid valve is normal in structure. Tricuspid  valve regurgitation is not demonstrated. No evidence of tricuspid  stenosis.   Aortic Valve: The aortic valve is normal in structure. Aortic valve  regurgitation is not visualized. No aortic stenosis is present.   Pulmonic Valve: The pulmonic valve was normal in structure. Pulmonic valve  regurgitation is not visualized. No evidence of pulmonic stenosis.   Aorta: The aortic root is normal in size and structure.   Venous: The inferior vena cava is normal in size with greater than 50%  respiratory variability, suggesting right atrial pressure of 3 mmHg.   IAS/Shunts: No atrial level shunt detected by color flow Doppler.    EKG:  EKG is  ordered today.  The ekg ordered today demonstrates sinus bradycardia 55 bpm  Recent Labs: 11/01/2021: ALT 21; B Natriuretic Peptide 933.7 11/02/2021: TSH 1.770 11/07/2021: Hemoglobin 9.4; Platelets 128 11/09/2021: BUN 25; Creatinine, Ser 4.99; Potassium 3.6; Sodium 135  Recent Lipid Panel No results found for: CHOL, TRIG, HDL, CHOLHDL, VLDL, LDLCALC, LDLDIRECT  ASSESSMENT & PLAN    Atrial fibrillation-EKG today shows sinus bradycardia 55 bpm.  Reports no further episodes of irregular heartbeat or accelerated heartbeat.  It appears that his atrial fibrillation was secondary to his recent acute illness/hospitalization.  Previous recommendation was to hold off on oral anticoagulation due to history/concern for GI bleed and severe anemia.  Underwent endoscopy/colonoscopy and was not noted to have active bleeding.  I will order a cardiac event  monitor to rule out recurrence of atrial fibrillation. Continue metoprolol, aspirin Heart healthy low-sodium diet Increase physical activity as tolerated Avoid triggers caffeine, chocolate, EtOH, dehydration etc. Order 30-day cardiac event monitor  Essential hypertension-BP today 122/60.  Well-controlled at home.  Reports that due to decreased blood pressure during dialysis he is now taking only 25 mg of metoprolol daily instead of 75. Continue metoprolol Heart healthy low-sodium diet-salty 6 given Increase physical activity as tolerated  Chronic combined  systolic and diastolic CHF-euvolemic today.  No increased DOE or activity intolerance.  Echocardiogram 11/02/2021 showed LVEF 45-50%, and intermediate diastolic parameters. Continue metoprolol Heart healthy low-sodium diet-salty 6 given Increase physical activity as tolerated Daily weights-contact office with a weight increase of 3 pounds overnight or 5 pounds in a week Elevate lower extremities when not active  Hyperlipidemia- reports compliance with Lipitor. Denies side effects.  Continue atorvastatin Heart healthy low-sodium high-fiber diet Increase physical activity as tolerated Follows with PCP  CKD stage IV/ESRD-received hemodialysis during recent admission.  Had AV fistula placed on 11/09/2021. Follows with nephrology  Disposition: Follow-up with Dr. Marlou Porch or APP in 2-3 months.       Medication Adjustments/Labs and Tests Ordered: Current medicines are reviewed at length with the patient today.  Concerns regarding medicines are outlined above.  Orders Placed This Encounter  Procedures   CARDIAC EVENT MONITOR   EKG 12-Lead   No orders of the defined types were placed in this encounter.   Patient Instructions  Medication Instructions:  Continue current medications  *If you need a refill on your cardiac medications before your next appointment, please call your pharmacy*   Lab Work: None  Ordered   Testing/Procedures: Your physician has recommended that you wear an event monitor for 30 days. Event monitors are medical devices that record the hearts electrical activity. Doctors most often [place] these monitors to diagnose arrhythmias. Arrhythmias are problems with the speed or rhythm of the heartbeat. The monitor is a small, portable device. You can wear one while you do your normal daily activities. This is usually used to diagnose what is causing palpitations/syncope (passing out).   Follow-Up: At [facility], you and your health needs are our priority.  As part of our continuing mission to provide you with exceptional heart care, we have created designated Provider Care Teams.  These Care Teams include your primary Cardiologist (physician) and Advanced Practice Providers (APPs -  Physician Assistants and Nurse Practitioners) who all work together to provide you with the care you need, when you need it.  We recommend signing up for the patient portal called "MyChart".  Sign up information is provided on this After Visit Summary.  MyChart is used to connect with patients for Virtual Visits (Telemedicine).  Patients are able to view lab/test results, encounter notes, upcoming appointments, etc.  Non-urgent messages can be sent to your provider as well.   To learn more about what you can do with MyChart, go to [email or web address].    Your next appointment:   3 month(s)  The format for your next appointment:   In Person  Provider:   Dr Skains:ot Or 1}or   Other Instructions Exercise recommendations: The American Heart Association recommends 150 minutes of moderate intensity exercise weekly. Try 30 minutes of moderate intensity exercise 4-5 times per week. This could include walking, jogging, or swimming.        Signed, Deberah Pelton, NP  11/23/2021 10:32 AM      Notice: This dictation was prepared with Dragon dictation along with smaller phrase technology. Any  transcriptional errors that result from this process are unintentional and may not be corrected upon review.  I spent 14 minutes examining this patient, reviewing medications, and using patient centered shared decision making involving her cardiac care.  Prior to her visit I spent greater than 20 minutes reviewing her past medical history,  medications, and prior cardiac tests.

## 2021-11-24 DIAGNOSIS — T8249XD Other complication of vascular dialysis catheter, subsequent encounter: Secondary | ICD-10-CM | POA: Diagnosis not present

## 2021-11-24 DIAGNOSIS — Z23 Encounter for immunization: Secondary | ICD-10-CM | POA: Diagnosis not present

## 2021-11-24 DIAGNOSIS — N186 End stage renal disease: Secondary | ICD-10-CM | POA: Diagnosis not present

## 2021-11-24 DIAGNOSIS — N2581 Secondary hyperparathyroidism of renal origin: Secondary | ICD-10-CM | POA: Diagnosis not present

## 2021-11-24 DIAGNOSIS — T8249XS Other complication of vascular dialysis catheter, sequela: Secondary | ICD-10-CM | POA: Diagnosis not present

## 2021-11-24 DIAGNOSIS — T8249XA Other complication of vascular dialysis catheter, initial encounter: Secondary | ICD-10-CM | POA: Diagnosis not present

## 2021-11-24 DIAGNOSIS — Z992 Dependence on renal dialysis: Secondary | ICD-10-CM | POA: Diagnosis not present

## 2021-11-27 DIAGNOSIS — Z992 Dependence on renal dialysis: Secondary | ICD-10-CM | POA: Diagnosis not present

## 2021-11-27 DIAGNOSIS — T8249XD Other complication of vascular dialysis catheter, subsequent encounter: Secondary | ICD-10-CM | POA: Diagnosis not present

## 2021-11-27 DIAGNOSIS — N186 End stage renal disease: Secondary | ICD-10-CM | POA: Diagnosis not present

## 2021-11-27 DIAGNOSIS — T8249XA Other complication of vascular dialysis catheter, initial encounter: Secondary | ICD-10-CM | POA: Diagnosis not present

## 2021-11-27 DIAGNOSIS — N2581 Secondary hyperparathyroidism of renal origin: Secondary | ICD-10-CM | POA: Diagnosis not present

## 2021-11-27 DIAGNOSIS — T8249XS Other complication of vascular dialysis catheter, sequela: Secondary | ICD-10-CM | POA: Diagnosis not present

## 2021-11-29 DIAGNOSIS — T8249XA Other complication of vascular dialysis catheter, initial encounter: Secondary | ICD-10-CM | POA: Diagnosis not present

## 2021-11-29 DIAGNOSIS — N186 End stage renal disease: Secondary | ICD-10-CM | POA: Diagnosis not present

## 2021-11-29 DIAGNOSIS — Z992 Dependence on renal dialysis: Secondary | ICD-10-CM | POA: Diagnosis not present

## 2021-11-29 DIAGNOSIS — T8249XD Other complication of vascular dialysis catheter, subsequent encounter: Secondary | ICD-10-CM | POA: Diagnosis not present

## 2021-11-29 DIAGNOSIS — N2581 Secondary hyperparathyroidism of renal origin: Secondary | ICD-10-CM | POA: Diagnosis not present

## 2021-11-29 DIAGNOSIS — T8249XS Other complication of vascular dialysis catheter, sequela: Secondary | ICD-10-CM | POA: Diagnosis not present

## 2021-12-01 DIAGNOSIS — T8249XD Other complication of vascular dialysis catheter, subsequent encounter: Secondary | ICD-10-CM | POA: Diagnosis not present

## 2021-12-01 DIAGNOSIS — Z992 Dependence on renal dialysis: Secondary | ICD-10-CM | POA: Diagnosis not present

## 2021-12-01 DIAGNOSIS — N186 End stage renal disease: Secondary | ICD-10-CM | POA: Diagnosis not present

## 2021-12-01 DIAGNOSIS — T8249XA Other complication of vascular dialysis catheter, initial encounter: Secondary | ICD-10-CM | POA: Diagnosis not present

## 2021-12-01 DIAGNOSIS — T8249XS Other complication of vascular dialysis catheter, sequela: Secondary | ICD-10-CM | POA: Diagnosis not present

## 2021-12-01 DIAGNOSIS — N2581 Secondary hyperparathyroidism of renal origin: Secondary | ICD-10-CM | POA: Diagnosis not present

## 2021-12-04 DIAGNOSIS — N186 End stage renal disease: Secondary | ICD-10-CM | POA: Diagnosis not present

## 2021-12-04 DIAGNOSIS — T8249XA Other complication of vascular dialysis catheter, initial encounter: Secondary | ICD-10-CM | POA: Diagnosis not present

## 2021-12-04 DIAGNOSIS — T8249XS Other complication of vascular dialysis catheter, sequela: Secondary | ICD-10-CM | POA: Diagnosis not present

## 2021-12-04 DIAGNOSIS — N2581 Secondary hyperparathyroidism of renal origin: Secondary | ICD-10-CM | POA: Diagnosis not present

## 2021-12-04 DIAGNOSIS — E1122 Type 2 diabetes mellitus with diabetic chronic kidney disease: Secondary | ICD-10-CM | POA: Diagnosis not present

## 2021-12-04 DIAGNOSIS — Z992 Dependence on renal dialysis: Secondary | ICD-10-CM | POA: Diagnosis not present

## 2021-12-04 DIAGNOSIS — T8249XD Other complication of vascular dialysis catheter, subsequent encounter: Secondary | ICD-10-CM | POA: Diagnosis not present

## 2021-12-05 ENCOUNTER — Other Ambulatory Visit: Payer: Self-pay

## 2021-12-05 DIAGNOSIS — N179 Acute kidney failure, unspecified: Secondary | ICD-10-CM

## 2021-12-06 DIAGNOSIS — T8249XA Other complication of vascular dialysis catheter, initial encounter: Secondary | ICD-10-CM | POA: Diagnosis not present

## 2021-12-06 DIAGNOSIS — T8249XS Other complication of vascular dialysis catheter, sequela: Secondary | ICD-10-CM | POA: Diagnosis not present

## 2021-12-06 DIAGNOSIS — T8249XD Other complication of vascular dialysis catheter, subsequent encounter: Secondary | ICD-10-CM | POA: Diagnosis not present

## 2021-12-06 DIAGNOSIS — N186 End stage renal disease: Secondary | ICD-10-CM | POA: Diagnosis not present

## 2021-12-06 DIAGNOSIS — N2581 Secondary hyperparathyroidism of renal origin: Secondary | ICD-10-CM | POA: Diagnosis not present

## 2021-12-06 DIAGNOSIS — Z992 Dependence on renal dialysis: Secondary | ICD-10-CM | POA: Diagnosis not present

## 2021-12-08 DIAGNOSIS — T8249XS Other complication of vascular dialysis catheter, sequela: Secondary | ICD-10-CM | POA: Diagnosis not present

## 2021-12-08 DIAGNOSIS — T8249XD Other complication of vascular dialysis catheter, subsequent encounter: Secondary | ICD-10-CM | POA: Diagnosis not present

## 2021-12-08 DIAGNOSIS — N2581 Secondary hyperparathyroidism of renal origin: Secondary | ICD-10-CM | POA: Diagnosis not present

## 2021-12-08 DIAGNOSIS — Z992 Dependence on renal dialysis: Secondary | ICD-10-CM | POA: Diagnosis not present

## 2021-12-08 DIAGNOSIS — T8249XA Other complication of vascular dialysis catheter, initial encounter: Secondary | ICD-10-CM | POA: Diagnosis not present

## 2021-12-08 DIAGNOSIS — N186 End stage renal disease: Secondary | ICD-10-CM | POA: Diagnosis not present

## 2021-12-11 DIAGNOSIS — N186 End stage renal disease: Secondary | ICD-10-CM | POA: Diagnosis not present

## 2021-12-11 DIAGNOSIS — T8249XD Other complication of vascular dialysis catheter, subsequent encounter: Secondary | ICD-10-CM | POA: Diagnosis not present

## 2021-12-11 DIAGNOSIS — T8249XS Other complication of vascular dialysis catheter, sequela: Secondary | ICD-10-CM | POA: Diagnosis not present

## 2021-12-11 DIAGNOSIS — N2581 Secondary hyperparathyroidism of renal origin: Secondary | ICD-10-CM | POA: Diagnosis not present

## 2021-12-11 DIAGNOSIS — T8249XA Other complication of vascular dialysis catheter, initial encounter: Secondary | ICD-10-CM | POA: Diagnosis not present

## 2021-12-11 DIAGNOSIS — Z992 Dependence on renal dialysis: Secondary | ICD-10-CM | POA: Diagnosis not present

## 2021-12-13 DIAGNOSIS — T8249XS Other complication of vascular dialysis catheter, sequela: Secondary | ICD-10-CM | POA: Diagnosis not present

## 2021-12-13 DIAGNOSIS — T8249XA Other complication of vascular dialysis catheter, initial encounter: Secondary | ICD-10-CM | POA: Diagnosis not present

## 2021-12-13 DIAGNOSIS — N186 End stage renal disease: Secondary | ICD-10-CM | POA: Diagnosis not present

## 2021-12-13 DIAGNOSIS — Z992 Dependence on renal dialysis: Secondary | ICD-10-CM | POA: Diagnosis not present

## 2021-12-13 DIAGNOSIS — N2581 Secondary hyperparathyroidism of renal origin: Secondary | ICD-10-CM | POA: Diagnosis not present

## 2021-12-13 DIAGNOSIS — T8249XD Other complication of vascular dialysis catheter, subsequent encounter: Secondary | ICD-10-CM | POA: Diagnosis not present

## 2021-12-15 DIAGNOSIS — T8249XS Other complication of vascular dialysis catheter, sequela: Secondary | ICD-10-CM | POA: Diagnosis not present

## 2021-12-15 DIAGNOSIS — Z992 Dependence on renal dialysis: Secondary | ICD-10-CM | POA: Diagnosis not present

## 2021-12-15 DIAGNOSIS — N186 End stage renal disease: Secondary | ICD-10-CM | POA: Diagnosis not present

## 2021-12-15 DIAGNOSIS — T8249XD Other complication of vascular dialysis catheter, subsequent encounter: Secondary | ICD-10-CM | POA: Diagnosis not present

## 2021-12-15 DIAGNOSIS — N2581 Secondary hyperparathyroidism of renal origin: Secondary | ICD-10-CM | POA: Diagnosis not present

## 2021-12-15 DIAGNOSIS — T8249XA Other complication of vascular dialysis catheter, initial encounter: Secondary | ICD-10-CM | POA: Diagnosis not present

## 2021-12-17 DIAGNOSIS — E782 Mixed hyperlipidemia: Secondary | ICD-10-CM | POA: Diagnosis not present

## 2021-12-17 DIAGNOSIS — E1122 Type 2 diabetes mellitus with diabetic chronic kidney disease: Secondary | ICD-10-CM | POA: Diagnosis not present

## 2021-12-17 DIAGNOSIS — Z992 Dependence on renal dialysis: Secondary | ICD-10-CM | POA: Diagnosis not present

## 2021-12-17 DIAGNOSIS — I10 Essential (primary) hypertension: Secondary | ICD-10-CM | POA: Diagnosis not present

## 2021-12-18 DIAGNOSIS — T8249XS Other complication of vascular dialysis catheter, sequela: Secondary | ICD-10-CM | POA: Diagnosis not present

## 2021-12-18 DIAGNOSIS — Z992 Dependence on renal dialysis: Secondary | ICD-10-CM | POA: Diagnosis not present

## 2021-12-18 DIAGNOSIS — T8249XD Other complication of vascular dialysis catheter, subsequent encounter: Secondary | ICD-10-CM | POA: Diagnosis not present

## 2021-12-18 DIAGNOSIS — N2581 Secondary hyperparathyroidism of renal origin: Secondary | ICD-10-CM | POA: Diagnosis not present

## 2021-12-18 DIAGNOSIS — N186 End stage renal disease: Secondary | ICD-10-CM | POA: Diagnosis not present

## 2021-12-18 DIAGNOSIS — T8249XA Other complication of vascular dialysis catheter, initial encounter: Secondary | ICD-10-CM | POA: Diagnosis not present

## 2021-12-20 ENCOUNTER — Encounter: Payer: Self-pay | Admitting: Physician Assistant

## 2021-12-20 ENCOUNTER — Encounter: Payer: Self-pay | Admitting: Vascular Surgery

## 2021-12-20 ENCOUNTER — Ambulatory Visit (INDEPENDENT_AMBULATORY_CARE_PROVIDER_SITE_OTHER): Payer: BC Managed Care – PPO | Admitting: Physician Assistant

## 2021-12-20 ENCOUNTER — Ambulatory Visit (HOSPITAL_COMMUNITY)
Admission: RE | Admit: 2021-12-20 | Discharge: 2021-12-20 | Disposition: A | Discharge status: Home / Self Care | Payer: BC Managed Care – PPO | Source: Ambulatory Visit | Attending: Vascular Surgery | Admitting: Vascular Surgery

## 2021-12-20 ENCOUNTER — Other Ambulatory Visit: Payer: Self-pay

## 2021-12-20 VITALS — BP 112/64 | HR 67 | Temp 96.4°F | Ht 65.0 in | Wt 155.6 lb

## 2021-12-20 DIAGNOSIS — Z992 Dependence on renal dialysis: Secondary | ICD-10-CM | POA: Diagnosis not present

## 2021-12-20 DIAGNOSIS — N179 Acute kidney failure, unspecified: Secondary | ICD-10-CM

## 2021-12-20 DIAGNOSIS — N186 End stage renal disease: Secondary | ICD-10-CM

## 2021-12-20 DIAGNOSIS — T8249XA Other complication of vascular dialysis catheter, initial encounter: Secondary | ICD-10-CM | POA: Diagnosis not present

## 2021-12-20 DIAGNOSIS — T8249XS Other complication of vascular dialysis catheter, sequela: Secondary | ICD-10-CM | POA: Diagnosis not present

## 2021-12-20 DIAGNOSIS — T8249XD Other complication of vascular dialysis catheter, subsequent encounter: Secondary | ICD-10-CM | POA: Diagnosis not present

## 2021-12-20 DIAGNOSIS — N2581 Secondary hyperparathyroidism of renal origin: Secondary | ICD-10-CM | POA: Diagnosis not present

## 2021-12-20 NOTE — Progress Notes (Signed)
? ? ?Postoperative Access Visit ? ? ?History of Present Illness  ? ?Allen Mathis is a 56 y.o. year old male who presents for postoperative follow-up for:   Left radiocephalic AV fistula by Dr. Scot Dock on 11/09/21.The patient's wounds are well healed.  The patient notes very mild steal symptoms. Has very intermittent tingling in his fingers. The patient is  able to complete their activities of daily living.   ? ?He currently dialyzes via a right IJ TDC on TTS at the [place] location ?Physical Examination  ? ?Vitals:  ? 12/20/21 1314  ?BP: 112/64  ?Pulse: 67  ?Temp: (!) 96.4 ?F (35.8 ?C)  ?Weight: 155 lb 9.6 oz (70.6 kg)  ?Height: '5\' 5"'$  (1.651 m)  ? ?Body mass index is 25.89 kg/m?. ? ?left arm Incision is well healed, 2+ radial pulse, hand grip is 5/5, sensation in digits is intact, palpable thrill, bruit can be auscultated. Fistula is easily palpable in the left forearm ?   ?Non invasive vascular lab: ?Findings:  ?+--------------------+----------+-----------------+--------+  ?AVF                 PSV (cm/s)Flow Vol (mL/min)Comments  ?+--------------------+----------+-----------------+--------+  ?Native artery inflow   240           612                 ?+--------------------+----------+-----------------+--------+  ?AVF Anastomosis        340                               ?+--------------------+----------+-----------------+--------+  ? ? +------------+----------+-------------+----------+----------------+  ?OUTFLOW VEINPSV (cm/s)Diameter (cm)Depth (cm)    Describe      ?+------------+----------+-------------+----------+----------------+  ?AC Fossa        86        0.50        0.32                     ?+------------+----------+-------------+----------+----------------+  ?Prox Forearm   104        0.64        0.27                     ?+------------+----------+-------------+----------+----------------+  ?Mid Forearm    202        0.47        0.25   competing branch   ?+------------+----------+-------------+----------+----------------+  ?Dist Forearm   382        0.32        0.28                     ?+------------+----------+-------------+----------+----------------+  ?  ?Summary:  ?Patent left arteriovenous fistula.  ?Arteriovenous fistula-Velocities less than 100cm/s noted (AC Fossa). ? ?Medical Decision Making  ? ?Allen Mathis is a 56 y.o. year old male who presents s/p  Left radiocephalic AV fistula by Dr. Scot Dock on 11/09/21.Incision is well healed. Fistula is patent and with great thrill. Duplex shows patent fistula with good volume flow. Fistula has matured nicely. There is mid portion of narrowing where there is branch. I don't think this will effect cannulation. It is okay for him to return to work without restrictions. ? ?Patent is without signs or symptoms of steal syndrome ?The patient's access will be ready for use after 02/06/22 ?The patient's tunneled dialysis catheter can be removed when Nephrology is comfortable with the performance of the left RC AV  fistula ?The patient may follow up on a prn basis ? ? ?Karoline Caldwell, PA-C ?[facility] [place] ?Office: [phone] ? ?Clinic MD: Dr. Scot Dock ?

## 2021-12-22 DIAGNOSIS — N2581 Secondary hyperparathyroidism of renal origin: Secondary | ICD-10-CM | POA: Diagnosis not present

## 2021-12-22 DIAGNOSIS — Z992 Dependence on renal dialysis: Secondary | ICD-10-CM | POA: Diagnosis not present

## 2021-12-22 DIAGNOSIS — N186 End stage renal disease: Secondary | ICD-10-CM | POA: Diagnosis not present

## 2021-12-22 DIAGNOSIS — T8249XS Other complication of vascular dialysis catheter, sequela: Secondary | ICD-10-CM | POA: Diagnosis not present

## 2021-12-22 DIAGNOSIS — T8249XD Other complication of vascular dialysis catheter, subsequent encounter: Secondary | ICD-10-CM | POA: Diagnosis not present

## 2021-12-22 DIAGNOSIS — T8249XA Other complication of vascular dialysis catheter, initial encounter: Secondary | ICD-10-CM | POA: Diagnosis not present

## 2021-12-25 DIAGNOSIS — T8249XA Other complication of vascular dialysis catheter, initial encounter: Secondary | ICD-10-CM | POA: Diagnosis not present

## 2021-12-25 DIAGNOSIS — N2581 Secondary hyperparathyroidism of renal origin: Secondary | ICD-10-CM | POA: Diagnosis not present

## 2021-12-25 DIAGNOSIS — Z992 Dependence on renal dialysis: Secondary | ICD-10-CM | POA: Diagnosis not present

## 2021-12-25 DIAGNOSIS — T8249XS Other complication of vascular dialysis catheter, sequela: Secondary | ICD-10-CM | POA: Diagnosis not present

## 2021-12-25 DIAGNOSIS — T8249XD Other complication of vascular dialysis catheter, subsequent encounter: Secondary | ICD-10-CM | POA: Diagnosis not present

## 2021-12-25 DIAGNOSIS — N186 End stage renal disease: Secondary | ICD-10-CM | POA: Diagnosis not present

## 2021-12-27 DIAGNOSIS — T8249XA Other complication of vascular dialysis catheter, initial encounter: Secondary | ICD-10-CM | POA: Diagnosis not present

## 2021-12-27 DIAGNOSIS — T8249XS Other complication of vascular dialysis catheter, sequela: Secondary | ICD-10-CM | POA: Diagnosis not present

## 2021-12-27 DIAGNOSIS — N186 End stage renal disease: Secondary | ICD-10-CM | POA: Diagnosis not present

## 2021-12-27 DIAGNOSIS — N2581 Secondary hyperparathyroidism of renal origin: Secondary | ICD-10-CM | POA: Diagnosis not present

## 2021-12-27 DIAGNOSIS — T8249XD Other complication of vascular dialysis catheter, subsequent encounter: Secondary | ICD-10-CM | POA: Diagnosis not present

## 2021-12-27 DIAGNOSIS — Z992 Dependence on renal dialysis: Secondary | ICD-10-CM | POA: Diagnosis not present

## 2021-12-29 DIAGNOSIS — T8249XD Other complication of vascular dialysis catheter, subsequent encounter: Secondary | ICD-10-CM | POA: Diagnosis not present

## 2021-12-29 DIAGNOSIS — T8249XA Other complication of vascular dialysis catheter, initial encounter: Secondary | ICD-10-CM | POA: Diagnosis not present

## 2021-12-29 DIAGNOSIS — N2581 Secondary hyperparathyroidism of renal origin: Secondary | ICD-10-CM | POA: Diagnosis not present

## 2021-12-29 DIAGNOSIS — Z992 Dependence on renal dialysis: Secondary | ICD-10-CM | POA: Diagnosis not present

## 2021-12-29 DIAGNOSIS — N186 End stage renal disease: Secondary | ICD-10-CM | POA: Diagnosis not present

## 2021-12-29 DIAGNOSIS — T8249XS Other complication of vascular dialysis catheter, sequela: Secondary | ICD-10-CM | POA: Diagnosis not present

## 2022-01-01 DIAGNOSIS — T8249XD Other complication of vascular dialysis catheter, subsequent encounter: Secondary | ICD-10-CM | POA: Diagnosis not present

## 2022-01-01 DIAGNOSIS — N186 End stage renal disease: Secondary | ICD-10-CM | POA: Diagnosis not present

## 2022-01-01 DIAGNOSIS — Z992 Dependence on renal dialysis: Secondary | ICD-10-CM | POA: Diagnosis not present

## 2022-01-01 DIAGNOSIS — T8249XA Other complication of vascular dialysis catheter, initial encounter: Secondary | ICD-10-CM | POA: Diagnosis not present

## 2022-01-01 DIAGNOSIS — N2581 Secondary hyperparathyroidism of renal origin: Secondary | ICD-10-CM | POA: Diagnosis not present

## 2022-01-01 DIAGNOSIS — T8249XS Other complication of vascular dialysis catheter, sequela: Secondary | ICD-10-CM | POA: Diagnosis not present

## 2022-01-03 DIAGNOSIS — T8249XA Other complication of vascular dialysis catheter, initial encounter: Secondary | ICD-10-CM | POA: Diagnosis not present

## 2022-01-03 DIAGNOSIS — T8249XS Other complication of vascular dialysis catheter, sequela: Secondary | ICD-10-CM | POA: Diagnosis not present

## 2022-01-03 DIAGNOSIS — Z992 Dependence on renal dialysis: Secondary | ICD-10-CM | POA: Diagnosis not present

## 2022-01-03 DIAGNOSIS — N186 End stage renal disease: Secondary | ICD-10-CM | POA: Diagnosis not present

## 2022-01-03 DIAGNOSIS — T8249XD Other complication of vascular dialysis catheter, subsequent encounter: Secondary | ICD-10-CM | POA: Diagnosis not present

## 2022-01-03 DIAGNOSIS — N2581 Secondary hyperparathyroidism of renal origin: Secondary | ICD-10-CM | POA: Diagnosis not present

## 2022-01-04 DIAGNOSIS — N186 End stage renal disease: Secondary | ICD-10-CM | POA: Diagnosis not present

## 2022-01-04 DIAGNOSIS — Z992 Dependence on renal dialysis: Secondary | ICD-10-CM | POA: Diagnosis not present

## 2022-01-04 DIAGNOSIS — E1122 Type 2 diabetes mellitus with diabetic chronic kidney disease: Secondary | ICD-10-CM | POA: Diagnosis not present

## 2022-01-05 DIAGNOSIS — N2581 Secondary hyperparathyroidism of renal origin: Secondary | ICD-10-CM | POA: Diagnosis not present

## 2022-01-05 DIAGNOSIS — T8249XD Other complication of vascular dialysis catheter, subsequent encounter: Secondary | ICD-10-CM | POA: Diagnosis not present

## 2022-01-05 DIAGNOSIS — Z992 Dependence on renal dialysis: Secondary | ICD-10-CM | POA: Diagnosis not present

## 2022-01-05 DIAGNOSIS — T8249XS Other complication of vascular dialysis catheter, sequela: Secondary | ICD-10-CM | POA: Diagnosis not present

## 2022-01-05 DIAGNOSIS — N186 End stage renal disease: Secondary | ICD-10-CM | POA: Diagnosis not present

## 2022-01-05 DIAGNOSIS — T8249XA Other complication of vascular dialysis catheter, initial encounter: Secondary | ICD-10-CM | POA: Diagnosis not present

## 2022-01-08 DIAGNOSIS — T8249XA Other complication of vascular dialysis catheter, initial encounter: Secondary | ICD-10-CM | POA: Diagnosis not present

## 2022-01-08 DIAGNOSIS — Z992 Dependence on renal dialysis: Secondary | ICD-10-CM | POA: Diagnosis not present

## 2022-01-08 DIAGNOSIS — T8249XD Other complication of vascular dialysis catheter, subsequent encounter: Secondary | ICD-10-CM | POA: Diagnosis not present

## 2022-01-08 DIAGNOSIS — T8249XS Other complication of vascular dialysis catheter, sequela: Secondary | ICD-10-CM | POA: Diagnosis not present

## 2022-01-08 DIAGNOSIS — N186 End stage renal disease: Secondary | ICD-10-CM | POA: Diagnosis not present

## 2022-01-08 DIAGNOSIS — N2581 Secondary hyperparathyroidism of renal origin: Secondary | ICD-10-CM | POA: Diagnosis not present

## 2022-01-10 DIAGNOSIS — N186 End stage renal disease: Secondary | ICD-10-CM | POA: Diagnosis not present

## 2022-01-10 DIAGNOSIS — T8249XA Other complication of vascular dialysis catheter, initial encounter: Secondary | ICD-10-CM | POA: Diagnosis not present

## 2022-01-10 DIAGNOSIS — T8249XS Other complication of vascular dialysis catheter, sequela: Secondary | ICD-10-CM | POA: Diagnosis not present

## 2022-01-10 DIAGNOSIS — K5730 Diverticulosis of large intestine without perforation or abscess without bleeding: Secondary | ICD-10-CM | POA: Diagnosis not present

## 2022-01-10 DIAGNOSIS — T8249XD Other complication of vascular dialysis catheter, subsequent encounter: Secondary | ICD-10-CM | POA: Diagnosis not present

## 2022-01-10 DIAGNOSIS — K2970 Gastritis, unspecified, without bleeding: Secondary | ICD-10-CM | POA: Diagnosis not present

## 2022-01-10 DIAGNOSIS — Z86010 Personal history of colonic polyps: Secondary | ICD-10-CM | POA: Diagnosis not present

## 2022-01-10 DIAGNOSIS — Z992 Dependence on renal dialysis: Secondary | ICD-10-CM | POA: Diagnosis not present

## 2022-01-10 DIAGNOSIS — B9681 Helicobacter pylori [H. pylori] as the cause of diseases classified elsewhere: Secondary | ICD-10-CM | POA: Diagnosis not present

## 2022-01-10 DIAGNOSIS — N2581 Secondary hyperparathyroidism of renal origin: Secondary | ICD-10-CM | POA: Diagnosis not present

## 2022-01-12 DIAGNOSIS — T8249XD Other complication of vascular dialysis catheter, subsequent encounter: Secondary | ICD-10-CM | POA: Diagnosis not present

## 2022-01-12 DIAGNOSIS — N2581 Secondary hyperparathyroidism of renal origin: Secondary | ICD-10-CM | POA: Diagnosis not present

## 2022-01-12 DIAGNOSIS — T8249XA Other complication of vascular dialysis catheter, initial encounter: Secondary | ICD-10-CM | POA: Diagnosis not present

## 2022-01-12 DIAGNOSIS — Z992 Dependence on renal dialysis: Secondary | ICD-10-CM | POA: Diagnosis not present

## 2022-01-12 DIAGNOSIS — T8249XS Other complication of vascular dialysis catheter, sequela: Secondary | ICD-10-CM | POA: Diagnosis not present

## 2022-01-12 DIAGNOSIS — N186 End stage renal disease: Secondary | ICD-10-CM | POA: Diagnosis not present

## 2022-01-15 DIAGNOSIS — T8249XD Other complication of vascular dialysis catheter, subsequent encounter: Secondary | ICD-10-CM | POA: Diagnosis not present

## 2022-01-15 DIAGNOSIS — N2581 Secondary hyperparathyroidism of renal origin: Secondary | ICD-10-CM | POA: Diagnosis not present

## 2022-01-15 DIAGNOSIS — T8249XA Other complication of vascular dialysis catheter, initial encounter: Secondary | ICD-10-CM | POA: Diagnosis not present

## 2022-01-15 DIAGNOSIS — Z992 Dependence on renal dialysis: Secondary | ICD-10-CM | POA: Diagnosis not present

## 2022-01-15 DIAGNOSIS — T8249XS Other complication of vascular dialysis catheter, sequela: Secondary | ICD-10-CM | POA: Diagnosis not present

## 2022-01-15 DIAGNOSIS — N186 End stage renal disease: Secondary | ICD-10-CM | POA: Diagnosis not present

## 2022-01-17 DIAGNOSIS — N186 End stage renal disease: Secondary | ICD-10-CM | POA: Diagnosis not present

## 2022-01-17 DIAGNOSIS — T8249XD Other complication of vascular dialysis catheter, subsequent encounter: Secondary | ICD-10-CM | POA: Diagnosis not present

## 2022-01-17 DIAGNOSIS — T8249XA Other complication of vascular dialysis catheter, initial encounter: Secondary | ICD-10-CM | POA: Diagnosis not present

## 2022-01-17 DIAGNOSIS — N2581 Secondary hyperparathyroidism of renal origin: Secondary | ICD-10-CM | POA: Diagnosis not present

## 2022-01-17 DIAGNOSIS — Z992 Dependence on renal dialysis: Secondary | ICD-10-CM | POA: Diagnosis not present

## 2022-01-17 DIAGNOSIS — T8249XS Other complication of vascular dialysis catheter, sequela: Secondary | ICD-10-CM | POA: Diagnosis not present

## 2022-01-19 DIAGNOSIS — N2581 Secondary hyperparathyroidism of renal origin: Secondary | ICD-10-CM | POA: Diagnosis not present

## 2022-01-19 DIAGNOSIS — N186 End stage renal disease: Secondary | ICD-10-CM | POA: Diagnosis not present

## 2022-01-19 DIAGNOSIS — T8249XD Other complication of vascular dialysis catheter, subsequent encounter: Secondary | ICD-10-CM | POA: Diagnosis not present

## 2022-01-19 DIAGNOSIS — T8249XA Other complication of vascular dialysis catheter, initial encounter: Secondary | ICD-10-CM | POA: Diagnosis not present

## 2022-01-19 DIAGNOSIS — Z992 Dependence on renal dialysis: Secondary | ICD-10-CM | POA: Diagnosis not present

## 2022-01-19 DIAGNOSIS — T8249XS Other complication of vascular dialysis catheter, sequela: Secondary | ICD-10-CM | POA: Diagnosis not present

## 2022-01-22 DIAGNOSIS — N2581 Secondary hyperparathyroidism of renal origin: Secondary | ICD-10-CM | POA: Diagnosis not present

## 2022-01-22 DIAGNOSIS — N186 End stage renal disease: Secondary | ICD-10-CM | POA: Diagnosis not present

## 2022-01-22 DIAGNOSIS — T8249XA Other complication of vascular dialysis catheter, initial encounter: Secondary | ICD-10-CM | POA: Diagnosis not present

## 2022-01-22 DIAGNOSIS — T8249XD Other complication of vascular dialysis catheter, subsequent encounter: Secondary | ICD-10-CM | POA: Diagnosis not present

## 2022-01-22 DIAGNOSIS — Z992 Dependence on renal dialysis: Secondary | ICD-10-CM | POA: Diagnosis not present

## 2022-01-22 DIAGNOSIS — T8249XS Other complication of vascular dialysis catheter, sequela: Secondary | ICD-10-CM | POA: Diagnosis not present

## 2022-01-24 DIAGNOSIS — N2581 Secondary hyperparathyroidism of renal origin: Secondary | ICD-10-CM | POA: Diagnosis not present

## 2022-01-24 DIAGNOSIS — T8249XS Other complication of vascular dialysis catheter, sequela: Secondary | ICD-10-CM | POA: Diagnosis not present

## 2022-01-24 DIAGNOSIS — N186 End stage renal disease: Secondary | ICD-10-CM | POA: Diagnosis not present

## 2022-01-24 DIAGNOSIS — T8249XA Other complication of vascular dialysis catheter, initial encounter: Secondary | ICD-10-CM | POA: Diagnosis not present

## 2022-01-24 DIAGNOSIS — Z992 Dependence on renal dialysis: Secondary | ICD-10-CM | POA: Diagnosis not present

## 2022-01-24 DIAGNOSIS — T8249XD Other complication of vascular dialysis catheter, subsequent encounter: Secondary | ICD-10-CM | POA: Diagnosis not present

## 2022-01-26 DIAGNOSIS — T8249XD Other complication of vascular dialysis catheter, subsequent encounter: Secondary | ICD-10-CM | POA: Diagnosis not present

## 2022-01-26 DIAGNOSIS — N186 End stage renal disease: Secondary | ICD-10-CM | POA: Diagnosis not present

## 2022-01-26 DIAGNOSIS — T8249XA Other complication of vascular dialysis catheter, initial encounter: Secondary | ICD-10-CM | POA: Diagnosis not present

## 2022-01-26 DIAGNOSIS — Z992 Dependence on renal dialysis: Secondary | ICD-10-CM | POA: Diagnosis not present

## 2022-01-26 DIAGNOSIS — T8249XS Other complication of vascular dialysis catheter, sequela: Secondary | ICD-10-CM | POA: Diagnosis not present

## 2022-01-26 DIAGNOSIS — N2581 Secondary hyperparathyroidism of renal origin: Secondary | ICD-10-CM | POA: Diagnosis not present

## 2022-01-29 DIAGNOSIS — T8249XD Other complication of vascular dialysis catheter, subsequent encounter: Secondary | ICD-10-CM | POA: Diagnosis not present

## 2022-01-29 DIAGNOSIS — Z992 Dependence on renal dialysis: Secondary | ICD-10-CM | POA: Diagnosis not present

## 2022-01-29 DIAGNOSIS — N186 End stage renal disease: Secondary | ICD-10-CM | POA: Diagnosis not present

## 2022-01-29 DIAGNOSIS — T8249XS Other complication of vascular dialysis catheter, sequela: Secondary | ICD-10-CM | POA: Diagnosis not present

## 2022-01-29 DIAGNOSIS — N2581 Secondary hyperparathyroidism of renal origin: Secondary | ICD-10-CM | POA: Diagnosis not present

## 2022-01-29 DIAGNOSIS — T8249XA Other complication of vascular dialysis catheter, initial encounter: Secondary | ICD-10-CM | POA: Diagnosis not present

## 2022-01-31 DIAGNOSIS — N186 End stage renal disease: Secondary | ICD-10-CM | POA: Diagnosis not present

## 2022-01-31 DIAGNOSIS — T8249XA Other complication of vascular dialysis catheter, initial encounter: Secondary | ICD-10-CM | POA: Diagnosis not present

## 2022-01-31 DIAGNOSIS — Z992 Dependence on renal dialysis: Secondary | ICD-10-CM | POA: Diagnosis not present

## 2022-01-31 DIAGNOSIS — N2581 Secondary hyperparathyroidism of renal origin: Secondary | ICD-10-CM | POA: Diagnosis not present

## 2022-01-31 DIAGNOSIS — T8249XD Other complication of vascular dialysis catheter, subsequent encounter: Secondary | ICD-10-CM | POA: Diagnosis not present

## 2022-01-31 DIAGNOSIS — T8249XS Other complication of vascular dialysis catheter, sequela: Secondary | ICD-10-CM | POA: Diagnosis not present

## 2022-02-02 DIAGNOSIS — T8249XA Other complication of vascular dialysis catheter, initial encounter: Secondary | ICD-10-CM | POA: Diagnosis not present

## 2022-02-02 DIAGNOSIS — N186 End stage renal disease: Secondary | ICD-10-CM | POA: Diagnosis not present

## 2022-02-02 DIAGNOSIS — Z992 Dependence on renal dialysis: Secondary | ICD-10-CM | POA: Diagnosis not present

## 2022-02-02 DIAGNOSIS — T8249XD Other complication of vascular dialysis catheter, subsequent encounter: Secondary | ICD-10-CM | POA: Diagnosis not present

## 2022-02-02 DIAGNOSIS — N2581 Secondary hyperparathyroidism of renal origin: Secondary | ICD-10-CM | POA: Diagnosis not present

## 2022-02-02 DIAGNOSIS — T8249XS Other complication of vascular dialysis catheter, sequela: Secondary | ICD-10-CM | POA: Diagnosis not present

## 2022-02-03 DIAGNOSIS — N186 End stage renal disease: Secondary | ICD-10-CM | POA: Diagnosis not present

## 2022-02-03 DIAGNOSIS — E1122 Type 2 diabetes mellitus with diabetic chronic kidney disease: Secondary | ICD-10-CM | POA: Diagnosis not present

## 2022-02-03 DIAGNOSIS — Z992 Dependence on renal dialysis: Secondary | ICD-10-CM | POA: Diagnosis not present

## 2022-02-05 DIAGNOSIS — T8249XS Other complication of vascular dialysis catheter, sequela: Secondary | ICD-10-CM | POA: Diagnosis not present

## 2022-02-05 DIAGNOSIS — Z992 Dependence on renal dialysis: Secondary | ICD-10-CM | POA: Diagnosis not present

## 2022-02-05 DIAGNOSIS — T8249XA Other complication of vascular dialysis catheter, initial encounter: Secondary | ICD-10-CM | POA: Diagnosis not present

## 2022-02-05 DIAGNOSIS — N2581 Secondary hyperparathyroidism of renal origin: Secondary | ICD-10-CM | POA: Diagnosis not present

## 2022-02-05 DIAGNOSIS — T8249XD Other complication of vascular dialysis catheter, subsequent encounter: Secondary | ICD-10-CM | POA: Diagnosis not present

## 2022-02-05 DIAGNOSIS — N186 End stage renal disease: Secondary | ICD-10-CM | POA: Diagnosis not present

## 2022-02-07 DIAGNOSIS — T8249XS Other complication of vascular dialysis catheter, sequela: Secondary | ICD-10-CM | POA: Diagnosis not present

## 2022-02-07 DIAGNOSIS — T8249XD Other complication of vascular dialysis catheter, subsequent encounter: Secondary | ICD-10-CM | POA: Diagnosis not present

## 2022-02-07 DIAGNOSIS — N2581 Secondary hyperparathyroidism of renal origin: Secondary | ICD-10-CM | POA: Diagnosis not present

## 2022-02-07 DIAGNOSIS — N186 End stage renal disease: Secondary | ICD-10-CM | POA: Diagnosis not present

## 2022-02-07 DIAGNOSIS — Z992 Dependence on renal dialysis: Secondary | ICD-10-CM | POA: Diagnosis not present

## 2022-02-07 DIAGNOSIS — T8249XA Other complication of vascular dialysis catheter, initial encounter: Secondary | ICD-10-CM | POA: Diagnosis not present

## 2022-02-09 DIAGNOSIS — N2581 Secondary hyperparathyroidism of renal origin: Secondary | ICD-10-CM | POA: Diagnosis not present

## 2022-02-09 DIAGNOSIS — T8249XS Other complication of vascular dialysis catheter, sequela: Secondary | ICD-10-CM | POA: Diagnosis not present

## 2022-02-09 DIAGNOSIS — T8249XD Other complication of vascular dialysis catheter, subsequent encounter: Secondary | ICD-10-CM | POA: Diagnosis not present

## 2022-02-09 DIAGNOSIS — N186 End stage renal disease: Secondary | ICD-10-CM | POA: Diagnosis not present

## 2022-02-09 DIAGNOSIS — Z992 Dependence on renal dialysis: Secondary | ICD-10-CM | POA: Diagnosis not present

## 2022-02-09 DIAGNOSIS — T8249XA Other complication of vascular dialysis catheter, initial encounter: Secondary | ICD-10-CM | POA: Diagnosis not present

## 2022-02-12 DIAGNOSIS — T8249XD Other complication of vascular dialysis catheter, subsequent encounter: Secondary | ICD-10-CM | POA: Diagnosis not present

## 2022-02-12 DIAGNOSIS — N2581 Secondary hyperparathyroidism of renal origin: Secondary | ICD-10-CM | POA: Diagnosis not present

## 2022-02-12 DIAGNOSIS — T8249XA Other complication of vascular dialysis catheter, initial encounter: Secondary | ICD-10-CM | POA: Diagnosis not present

## 2022-02-12 DIAGNOSIS — Z23 Encounter for immunization: Secondary | ICD-10-CM | POA: Diagnosis not present

## 2022-02-12 DIAGNOSIS — N186 End stage renal disease: Secondary | ICD-10-CM | POA: Diagnosis not present

## 2022-02-12 DIAGNOSIS — T8249XS Other complication of vascular dialysis catheter, sequela: Secondary | ICD-10-CM | POA: Diagnosis not present

## 2022-02-12 DIAGNOSIS — Z992 Dependence on renal dialysis: Secondary | ICD-10-CM | POA: Diagnosis not present

## 2022-02-14 DIAGNOSIS — T8249XA Other complication of vascular dialysis catheter, initial encounter: Secondary | ICD-10-CM | POA: Diagnosis not present

## 2022-02-14 DIAGNOSIS — Z23 Encounter for immunization: Secondary | ICD-10-CM | POA: Diagnosis not present

## 2022-02-14 DIAGNOSIS — T8249XS Other complication of vascular dialysis catheter, sequela: Secondary | ICD-10-CM | POA: Diagnosis not present

## 2022-02-14 DIAGNOSIS — N2581 Secondary hyperparathyroidism of renal origin: Secondary | ICD-10-CM | POA: Diagnosis not present

## 2022-02-14 DIAGNOSIS — T8249XD Other complication of vascular dialysis catheter, subsequent encounter: Secondary | ICD-10-CM | POA: Diagnosis not present

## 2022-02-14 DIAGNOSIS — N186 End stage renal disease: Secondary | ICD-10-CM | POA: Diagnosis not present

## 2022-02-14 DIAGNOSIS — Z992 Dependence on renal dialysis: Secondary | ICD-10-CM | POA: Diagnosis not present

## 2022-02-16 DIAGNOSIS — T8249XD Other complication of vascular dialysis catheter, subsequent encounter: Secondary | ICD-10-CM | POA: Diagnosis not present

## 2022-02-16 DIAGNOSIS — Z992 Dependence on renal dialysis: Secondary | ICD-10-CM | POA: Diagnosis not present

## 2022-02-16 DIAGNOSIS — T8249XA Other complication of vascular dialysis catheter, initial encounter: Secondary | ICD-10-CM | POA: Diagnosis not present

## 2022-02-16 DIAGNOSIS — Z23 Encounter for immunization: Secondary | ICD-10-CM | POA: Diagnosis not present

## 2022-02-16 DIAGNOSIS — T8249XS Other complication of vascular dialysis catheter, sequela: Secondary | ICD-10-CM | POA: Diagnosis not present

## 2022-02-16 DIAGNOSIS — N2581 Secondary hyperparathyroidism of renal origin: Secondary | ICD-10-CM | POA: Diagnosis not present

## 2022-02-16 DIAGNOSIS — N186 End stage renal disease: Secondary | ICD-10-CM | POA: Diagnosis not present

## 2022-02-18 ENCOUNTER — Telehealth: Payer: Self-pay | Admitting: Cardiology

## 2022-02-18 NOTE — Telephone Encounter (Signed)
Patient called stating he cancelled his appt for 5/18 because he never received the monitor he was suppose wear for 30 days.  He states his heart is doing fine, he feels he doesn't need to come in.  ?

## 2022-02-19 DIAGNOSIS — T8249XS Other complication of vascular dialysis catheter, sequela: Secondary | ICD-10-CM | POA: Diagnosis not present

## 2022-02-19 DIAGNOSIS — N2581 Secondary hyperparathyroidism of renal origin: Secondary | ICD-10-CM | POA: Diagnosis not present

## 2022-02-19 DIAGNOSIS — T8249XD Other complication of vascular dialysis catheter, subsequent encounter: Secondary | ICD-10-CM | POA: Diagnosis not present

## 2022-02-19 DIAGNOSIS — Z992 Dependence on renal dialysis: Secondary | ICD-10-CM | POA: Diagnosis not present

## 2022-02-19 DIAGNOSIS — N186 End stage renal disease: Secondary | ICD-10-CM | POA: Diagnosis not present

## 2022-02-19 DIAGNOSIS — T8249XA Other complication of vascular dialysis catheter, initial encounter: Secondary | ICD-10-CM | POA: Diagnosis not present

## 2022-02-19 NOTE — Telephone Encounter (Signed)
FYI -  Pt never received heart monitor that was ordered.  Has not seen Dr Marlou Porch. ?

## 2022-02-21 ENCOUNTER — Ambulatory Visit: Payer: BC Managed Care – PPO | Admitting: Cardiology

## 2022-02-21 DIAGNOSIS — T8249XA Other complication of vascular dialysis catheter, initial encounter: Secondary | ICD-10-CM | POA: Diagnosis not present

## 2022-02-21 DIAGNOSIS — Z992 Dependence on renal dialysis: Secondary | ICD-10-CM | POA: Diagnosis not present

## 2022-02-21 DIAGNOSIS — N186 End stage renal disease: Secondary | ICD-10-CM | POA: Diagnosis not present

## 2022-02-21 DIAGNOSIS — T8249XS Other complication of vascular dialysis catheter, sequela: Secondary | ICD-10-CM | POA: Diagnosis not present

## 2022-02-21 DIAGNOSIS — N2581 Secondary hyperparathyroidism of renal origin: Secondary | ICD-10-CM | POA: Diagnosis not present

## 2022-02-21 DIAGNOSIS — T8249XD Other complication of vascular dialysis catheter, subsequent encounter: Secondary | ICD-10-CM | POA: Diagnosis not present

## 2022-02-23 DIAGNOSIS — Z992 Dependence on renal dialysis: Secondary | ICD-10-CM | POA: Diagnosis not present

## 2022-02-23 DIAGNOSIS — N2581 Secondary hyperparathyroidism of renal origin: Secondary | ICD-10-CM | POA: Diagnosis not present

## 2022-02-23 DIAGNOSIS — T8249XD Other complication of vascular dialysis catheter, subsequent encounter: Secondary | ICD-10-CM | POA: Diagnosis not present

## 2022-02-23 DIAGNOSIS — T8249XA Other complication of vascular dialysis catheter, initial encounter: Secondary | ICD-10-CM | POA: Diagnosis not present

## 2022-02-23 DIAGNOSIS — N186 End stage renal disease: Secondary | ICD-10-CM | POA: Diagnosis not present

## 2022-02-23 DIAGNOSIS — T8249XS Other complication of vascular dialysis catheter, sequela: Secondary | ICD-10-CM | POA: Diagnosis not present

## 2022-02-26 DIAGNOSIS — T8249XA Other complication of vascular dialysis catheter, initial encounter: Secondary | ICD-10-CM | POA: Diagnosis not present

## 2022-02-26 DIAGNOSIS — N186 End stage renal disease: Secondary | ICD-10-CM | POA: Diagnosis not present

## 2022-02-26 DIAGNOSIS — T8249XD Other complication of vascular dialysis catheter, subsequent encounter: Secondary | ICD-10-CM | POA: Diagnosis not present

## 2022-02-26 DIAGNOSIS — Z992 Dependence on renal dialysis: Secondary | ICD-10-CM | POA: Diagnosis not present

## 2022-02-26 DIAGNOSIS — N2581 Secondary hyperparathyroidism of renal origin: Secondary | ICD-10-CM | POA: Diagnosis not present

## 2022-02-26 DIAGNOSIS — T8249XS Other complication of vascular dialysis catheter, sequela: Secondary | ICD-10-CM | POA: Diagnosis not present

## 2022-02-28 DIAGNOSIS — N186 End stage renal disease: Secondary | ICD-10-CM | POA: Diagnosis not present

## 2022-02-28 DIAGNOSIS — T8249XD Other complication of vascular dialysis catheter, subsequent encounter: Secondary | ICD-10-CM | POA: Diagnosis not present

## 2022-02-28 DIAGNOSIS — N2581 Secondary hyperparathyroidism of renal origin: Secondary | ICD-10-CM | POA: Diagnosis not present

## 2022-02-28 DIAGNOSIS — Z992 Dependence on renal dialysis: Secondary | ICD-10-CM | POA: Diagnosis not present

## 2022-02-28 DIAGNOSIS — T8249XS Other complication of vascular dialysis catheter, sequela: Secondary | ICD-10-CM | POA: Diagnosis not present

## 2022-02-28 DIAGNOSIS — T8249XA Other complication of vascular dialysis catheter, initial encounter: Secondary | ICD-10-CM | POA: Diagnosis not present

## 2022-03-02 DIAGNOSIS — T8249XS Other complication of vascular dialysis catheter, sequela: Secondary | ICD-10-CM | POA: Diagnosis not present

## 2022-03-02 DIAGNOSIS — N2581 Secondary hyperparathyroidism of renal origin: Secondary | ICD-10-CM | POA: Diagnosis not present

## 2022-03-02 DIAGNOSIS — T8249XD Other complication of vascular dialysis catheter, subsequent encounter: Secondary | ICD-10-CM | POA: Diagnosis not present

## 2022-03-02 DIAGNOSIS — T8249XA Other complication of vascular dialysis catheter, initial encounter: Secondary | ICD-10-CM | POA: Diagnosis not present

## 2022-03-02 DIAGNOSIS — Z992 Dependence on renal dialysis: Secondary | ICD-10-CM | POA: Diagnosis not present

## 2022-03-02 DIAGNOSIS — N186 End stage renal disease: Secondary | ICD-10-CM | POA: Diagnosis not present

## 2022-03-05 DIAGNOSIS — T8249XA Other complication of vascular dialysis catheter, initial encounter: Secondary | ICD-10-CM | POA: Diagnosis not present

## 2022-03-05 DIAGNOSIS — T8249XS Other complication of vascular dialysis catheter, sequela: Secondary | ICD-10-CM | POA: Diagnosis not present

## 2022-03-05 DIAGNOSIS — N2581 Secondary hyperparathyroidism of renal origin: Secondary | ICD-10-CM | POA: Diagnosis not present

## 2022-03-05 DIAGNOSIS — N186 End stage renal disease: Secondary | ICD-10-CM | POA: Diagnosis not present

## 2022-03-05 DIAGNOSIS — T8249XD Other complication of vascular dialysis catheter, subsequent encounter: Secondary | ICD-10-CM | POA: Diagnosis not present

## 2022-03-05 DIAGNOSIS — Z992 Dependence on renal dialysis: Secondary | ICD-10-CM | POA: Diagnosis not present

## 2022-03-06 DIAGNOSIS — N186 End stage renal disease: Secondary | ICD-10-CM | POA: Diagnosis not present

## 2022-03-06 DIAGNOSIS — E1122 Type 2 diabetes mellitus with diabetic chronic kidney disease: Secondary | ICD-10-CM | POA: Diagnosis not present

## 2022-03-06 DIAGNOSIS — Z992 Dependence on renal dialysis: Secondary | ICD-10-CM | POA: Diagnosis not present

## 2022-03-07 DIAGNOSIS — T8249XS Other complication of vascular dialysis catheter, sequela: Secondary | ICD-10-CM | POA: Diagnosis not present

## 2022-03-07 DIAGNOSIS — Z992 Dependence on renal dialysis: Secondary | ICD-10-CM | POA: Diagnosis not present

## 2022-03-07 DIAGNOSIS — T8249XD Other complication of vascular dialysis catheter, subsequent encounter: Secondary | ICD-10-CM | POA: Diagnosis not present

## 2022-03-07 DIAGNOSIS — N186 End stage renal disease: Secondary | ICD-10-CM | POA: Diagnosis not present

## 2022-03-07 DIAGNOSIS — T8249XA Other complication of vascular dialysis catheter, initial encounter: Secondary | ICD-10-CM | POA: Diagnosis not present

## 2022-03-07 DIAGNOSIS — N2581 Secondary hyperparathyroidism of renal origin: Secondary | ICD-10-CM | POA: Diagnosis not present

## 2022-03-09 DIAGNOSIS — T8249XA Other complication of vascular dialysis catheter, initial encounter: Secondary | ICD-10-CM | POA: Diagnosis not present

## 2022-03-09 DIAGNOSIS — N186 End stage renal disease: Secondary | ICD-10-CM | POA: Diagnosis not present

## 2022-03-09 DIAGNOSIS — T8249XS Other complication of vascular dialysis catheter, sequela: Secondary | ICD-10-CM | POA: Diagnosis not present

## 2022-03-09 DIAGNOSIS — N2581 Secondary hyperparathyroidism of renal origin: Secondary | ICD-10-CM | POA: Diagnosis not present

## 2022-03-09 DIAGNOSIS — T8249XD Other complication of vascular dialysis catheter, subsequent encounter: Secondary | ICD-10-CM | POA: Diagnosis not present

## 2022-03-09 DIAGNOSIS — Z992 Dependence on renal dialysis: Secondary | ICD-10-CM | POA: Diagnosis not present

## 2022-03-11 DIAGNOSIS — I480 Paroxysmal atrial fibrillation: Secondary | ICD-10-CM | POA: Diagnosis not present

## 2022-03-11 DIAGNOSIS — E1122 Type 2 diabetes mellitus with diabetic chronic kidney disease: Secondary | ICD-10-CM | POA: Diagnosis not present

## 2022-03-11 DIAGNOSIS — Z992 Dependence on renal dialysis: Secondary | ICD-10-CM | POA: Diagnosis not present

## 2022-03-11 DIAGNOSIS — N186 End stage renal disease: Secondary | ICD-10-CM | POA: Diagnosis not present

## 2022-03-12 DIAGNOSIS — N2581 Secondary hyperparathyroidism of renal origin: Secondary | ICD-10-CM | POA: Diagnosis not present

## 2022-03-12 DIAGNOSIS — T8249XS Other complication of vascular dialysis catheter, sequela: Secondary | ICD-10-CM | POA: Diagnosis not present

## 2022-03-12 DIAGNOSIS — T8249XD Other complication of vascular dialysis catheter, subsequent encounter: Secondary | ICD-10-CM | POA: Diagnosis not present

## 2022-03-12 DIAGNOSIS — Z992 Dependence on renal dialysis: Secondary | ICD-10-CM | POA: Diagnosis not present

## 2022-03-12 DIAGNOSIS — N186 End stage renal disease: Secondary | ICD-10-CM | POA: Diagnosis not present

## 2022-03-12 DIAGNOSIS — T8249XA Other complication of vascular dialysis catheter, initial encounter: Secondary | ICD-10-CM | POA: Diagnosis not present

## 2022-03-14 DIAGNOSIS — N2581 Secondary hyperparathyroidism of renal origin: Secondary | ICD-10-CM | POA: Diagnosis not present

## 2022-03-14 DIAGNOSIS — T8249XS Other complication of vascular dialysis catheter, sequela: Secondary | ICD-10-CM | POA: Diagnosis not present

## 2022-03-14 DIAGNOSIS — Z992 Dependence on renal dialysis: Secondary | ICD-10-CM | POA: Diagnosis not present

## 2022-03-14 DIAGNOSIS — T8249XA Other complication of vascular dialysis catheter, initial encounter: Secondary | ICD-10-CM | POA: Diagnosis not present

## 2022-03-14 DIAGNOSIS — T8249XD Other complication of vascular dialysis catheter, subsequent encounter: Secondary | ICD-10-CM | POA: Diagnosis not present

## 2022-03-14 DIAGNOSIS — N186 End stage renal disease: Secondary | ICD-10-CM | POA: Diagnosis not present

## 2022-03-16 DIAGNOSIS — T8249XS Other complication of vascular dialysis catheter, sequela: Secondary | ICD-10-CM | POA: Diagnosis not present

## 2022-03-16 DIAGNOSIS — T8249XA Other complication of vascular dialysis catheter, initial encounter: Secondary | ICD-10-CM | POA: Diagnosis not present

## 2022-03-16 DIAGNOSIS — N186 End stage renal disease: Secondary | ICD-10-CM | POA: Diagnosis not present

## 2022-03-16 DIAGNOSIS — N2581 Secondary hyperparathyroidism of renal origin: Secondary | ICD-10-CM | POA: Diagnosis not present

## 2022-03-16 DIAGNOSIS — Z992 Dependence on renal dialysis: Secondary | ICD-10-CM | POA: Diagnosis not present

## 2022-03-16 DIAGNOSIS — T8249XD Other complication of vascular dialysis catheter, subsequent encounter: Secondary | ICD-10-CM | POA: Diagnosis not present

## 2022-03-19 DIAGNOSIS — T8249XD Other complication of vascular dialysis catheter, subsequent encounter: Secondary | ICD-10-CM | POA: Diagnosis not present

## 2022-03-19 DIAGNOSIS — Z992 Dependence on renal dialysis: Secondary | ICD-10-CM | POA: Diagnosis not present

## 2022-03-19 DIAGNOSIS — T8249XA Other complication of vascular dialysis catheter, initial encounter: Secondary | ICD-10-CM | POA: Diagnosis not present

## 2022-03-19 DIAGNOSIS — T8249XS Other complication of vascular dialysis catheter, sequela: Secondary | ICD-10-CM | POA: Diagnosis not present

## 2022-03-19 DIAGNOSIS — N2581 Secondary hyperparathyroidism of renal origin: Secondary | ICD-10-CM | POA: Diagnosis not present

## 2022-03-19 DIAGNOSIS — Z23 Encounter for immunization: Secondary | ICD-10-CM | POA: Diagnosis not present

## 2022-03-19 DIAGNOSIS — N186 End stage renal disease: Secondary | ICD-10-CM | POA: Diagnosis not present

## 2022-03-21 DIAGNOSIS — Z992 Dependence on renal dialysis: Secondary | ICD-10-CM | POA: Diagnosis not present

## 2022-03-21 DIAGNOSIS — T8249XS Other complication of vascular dialysis catheter, sequela: Secondary | ICD-10-CM | POA: Diagnosis not present

## 2022-03-21 DIAGNOSIS — N2581 Secondary hyperparathyroidism of renal origin: Secondary | ICD-10-CM | POA: Diagnosis not present

## 2022-03-21 DIAGNOSIS — T8249XD Other complication of vascular dialysis catheter, subsequent encounter: Secondary | ICD-10-CM | POA: Diagnosis not present

## 2022-03-21 DIAGNOSIS — T8249XA Other complication of vascular dialysis catheter, initial encounter: Secondary | ICD-10-CM | POA: Diagnosis not present

## 2022-03-21 DIAGNOSIS — Z23 Encounter for immunization: Secondary | ICD-10-CM | POA: Diagnosis not present

## 2022-03-21 DIAGNOSIS — N186 End stage renal disease: Secondary | ICD-10-CM | POA: Diagnosis not present

## 2022-03-23 DIAGNOSIS — N2581 Secondary hyperparathyroidism of renal origin: Secondary | ICD-10-CM | POA: Diagnosis not present

## 2022-03-23 DIAGNOSIS — T8249XD Other complication of vascular dialysis catheter, subsequent encounter: Secondary | ICD-10-CM | POA: Diagnosis not present

## 2022-03-23 DIAGNOSIS — Z23 Encounter for immunization: Secondary | ICD-10-CM | POA: Diagnosis not present

## 2022-03-23 DIAGNOSIS — T8249XS Other complication of vascular dialysis catheter, sequela: Secondary | ICD-10-CM | POA: Diagnosis not present

## 2022-03-23 DIAGNOSIS — T8249XA Other complication of vascular dialysis catheter, initial encounter: Secondary | ICD-10-CM | POA: Diagnosis not present

## 2022-03-23 DIAGNOSIS — Z992 Dependence on renal dialysis: Secondary | ICD-10-CM | POA: Diagnosis not present

## 2022-03-23 DIAGNOSIS — N186 End stage renal disease: Secondary | ICD-10-CM | POA: Diagnosis not present

## 2022-03-26 DIAGNOSIS — Z992 Dependence on renal dialysis: Secondary | ICD-10-CM | POA: Diagnosis not present

## 2022-03-26 DIAGNOSIS — N186 End stage renal disease: Secondary | ICD-10-CM | POA: Diagnosis not present

## 2022-03-26 DIAGNOSIS — T8249XD Other complication of vascular dialysis catheter, subsequent encounter: Secondary | ICD-10-CM | POA: Diagnosis not present

## 2022-03-26 DIAGNOSIS — T8249XS Other complication of vascular dialysis catheter, sequela: Secondary | ICD-10-CM | POA: Diagnosis not present

## 2022-03-26 DIAGNOSIS — T8249XA Other complication of vascular dialysis catheter, initial encounter: Secondary | ICD-10-CM | POA: Diagnosis not present

## 2022-03-26 DIAGNOSIS — N2581 Secondary hyperparathyroidism of renal origin: Secondary | ICD-10-CM | POA: Diagnosis not present

## 2022-03-28 DIAGNOSIS — N186 End stage renal disease: Secondary | ICD-10-CM | POA: Diagnosis not present

## 2022-03-28 DIAGNOSIS — N2581 Secondary hyperparathyroidism of renal origin: Secondary | ICD-10-CM | POA: Diagnosis not present

## 2022-03-28 DIAGNOSIS — T8249XS Other complication of vascular dialysis catheter, sequela: Secondary | ICD-10-CM | POA: Diagnosis not present

## 2022-03-28 DIAGNOSIS — T8249XA Other complication of vascular dialysis catheter, initial encounter: Secondary | ICD-10-CM | POA: Diagnosis not present

## 2022-03-28 DIAGNOSIS — Z992 Dependence on renal dialysis: Secondary | ICD-10-CM | POA: Diagnosis not present

## 2022-03-28 DIAGNOSIS — T8249XD Other complication of vascular dialysis catheter, subsequent encounter: Secondary | ICD-10-CM | POA: Diagnosis not present

## 2022-03-30 DIAGNOSIS — T8249XA Other complication of vascular dialysis catheter, initial encounter: Secondary | ICD-10-CM | POA: Diagnosis not present

## 2022-03-30 DIAGNOSIS — N2581 Secondary hyperparathyroidism of renal origin: Secondary | ICD-10-CM | POA: Diagnosis not present

## 2022-03-30 DIAGNOSIS — T8249XS Other complication of vascular dialysis catheter, sequela: Secondary | ICD-10-CM | POA: Diagnosis not present

## 2022-03-30 DIAGNOSIS — Z992 Dependence on renal dialysis: Secondary | ICD-10-CM | POA: Diagnosis not present

## 2022-03-30 DIAGNOSIS — T8249XD Other complication of vascular dialysis catheter, subsequent encounter: Secondary | ICD-10-CM | POA: Diagnosis not present

## 2022-03-30 DIAGNOSIS — N186 End stage renal disease: Secondary | ICD-10-CM | POA: Diagnosis not present

## 2022-04-02 DIAGNOSIS — N186 End stage renal disease: Secondary | ICD-10-CM | POA: Diagnosis not present

## 2022-04-02 DIAGNOSIS — T8249XD Other complication of vascular dialysis catheter, subsequent encounter: Secondary | ICD-10-CM | POA: Diagnosis not present

## 2022-04-02 DIAGNOSIS — T8249XS Other complication of vascular dialysis catheter, sequela: Secondary | ICD-10-CM | POA: Diagnosis not present

## 2022-04-02 DIAGNOSIS — T8249XA Other complication of vascular dialysis catheter, initial encounter: Secondary | ICD-10-CM | POA: Diagnosis not present

## 2022-04-02 DIAGNOSIS — N2581 Secondary hyperparathyroidism of renal origin: Secondary | ICD-10-CM | POA: Diagnosis not present

## 2022-04-02 DIAGNOSIS — Z992 Dependence on renal dialysis: Secondary | ICD-10-CM | POA: Diagnosis not present

## 2022-04-04 DIAGNOSIS — T8249XD Other complication of vascular dialysis catheter, subsequent encounter: Secondary | ICD-10-CM | POA: Diagnosis not present

## 2022-04-04 DIAGNOSIS — N186 End stage renal disease: Secondary | ICD-10-CM | POA: Diagnosis not present

## 2022-04-04 DIAGNOSIS — T8249XA Other complication of vascular dialysis catheter, initial encounter: Secondary | ICD-10-CM | POA: Diagnosis not present

## 2022-04-04 DIAGNOSIS — Z992 Dependence on renal dialysis: Secondary | ICD-10-CM | POA: Diagnosis not present

## 2022-04-04 DIAGNOSIS — T8249XS Other complication of vascular dialysis catheter, sequela: Secondary | ICD-10-CM | POA: Diagnosis not present

## 2022-04-04 DIAGNOSIS — N2581 Secondary hyperparathyroidism of renal origin: Secondary | ICD-10-CM | POA: Diagnosis not present

## 2022-04-05 DIAGNOSIS — E1122 Type 2 diabetes mellitus with diabetic chronic kidney disease: Secondary | ICD-10-CM | POA: Diagnosis not present

## 2022-04-05 DIAGNOSIS — N186 End stage renal disease: Secondary | ICD-10-CM | POA: Diagnosis not present

## 2022-04-05 DIAGNOSIS — Z992 Dependence on renal dialysis: Secondary | ICD-10-CM | POA: Diagnosis not present

## 2022-04-06 DIAGNOSIS — N186 End stage renal disease: Secondary | ICD-10-CM | POA: Diagnosis not present

## 2022-04-06 DIAGNOSIS — T8249XA Other complication of vascular dialysis catheter, initial encounter: Secondary | ICD-10-CM | POA: Diagnosis not present

## 2022-04-06 DIAGNOSIS — T8249XS Other complication of vascular dialysis catheter, sequela: Secondary | ICD-10-CM | POA: Diagnosis not present

## 2022-04-06 DIAGNOSIS — T8249XD Other complication of vascular dialysis catheter, subsequent encounter: Secondary | ICD-10-CM | POA: Diagnosis not present

## 2022-04-06 DIAGNOSIS — Z992 Dependence on renal dialysis: Secondary | ICD-10-CM | POA: Diagnosis not present

## 2022-04-06 DIAGNOSIS — N2581 Secondary hyperparathyroidism of renal origin: Secondary | ICD-10-CM | POA: Diagnosis not present

## 2022-04-09 DIAGNOSIS — N2581 Secondary hyperparathyroidism of renal origin: Secondary | ICD-10-CM | POA: Diagnosis not present

## 2022-04-09 DIAGNOSIS — T8249XD Other complication of vascular dialysis catheter, subsequent encounter: Secondary | ICD-10-CM | POA: Diagnosis not present

## 2022-04-09 DIAGNOSIS — N186 End stage renal disease: Secondary | ICD-10-CM | POA: Diagnosis not present

## 2022-04-09 DIAGNOSIS — Z992 Dependence on renal dialysis: Secondary | ICD-10-CM | POA: Diagnosis not present

## 2022-04-09 DIAGNOSIS — T8249XS Other complication of vascular dialysis catheter, sequela: Secondary | ICD-10-CM | POA: Diagnosis not present

## 2022-04-09 DIAGNOSIS — T8249XA Other complication of vascular dialysis catheter, initial encounter: Secondary | ICD-10-CM | POA: Diagnosis not present

## 2022-04-11 DIAGNOSIS — T8249XS Other complication of vascular dialysis catheter, sequela: Secondary | ICD-10-CM | POA: Diagnosis not present

## 2022-04-11 DIAGNOSIS — N2581 Secondary hyperparathyroidism of renal origin: Secondary | ICD-10-CM | POA: Diagnosis not present

## 2022-04-11 DIAGNOSIS — Z992 Dependence on renal dialysis: Secondary | ICD-10-CM | POA: Diagnosis not present

## 2022-04-11 DIAGNOSIS — T8249XA Other complication of vascular dialysis catheter, initial encounter: Secondary | ICD-10-CM | POA: Diagnosis not present

## 2022-04-11 DIAGNOSIS — T8249XD Other complication of vascular dialysis catheter, subsequent encounter: Secondary | ICD-10-CM | POA: Diagnosis not present

## 2022-04-11 DIAGNOSIS — N186 End stage renal disease: Secondary | ICD-10-CM | POA: Diagnosis not present

## 2022-04-13 DIAGNOSIS — T8249XA Other complication of vascular dialysis catheter, initial encounter: Secondary | ICD-10-CM | POA: Diagnosis not present

## 2022-04-13 DIAGNOSIS — T8249XS Other complication of vascular dialysis catheter, sequela: Secondary | ICD-10-CM | POA: Diagnosis not present

## 2022-04-13 DIAGNOSIS — Z992 Dependence on renal dialysis: Secondary | ICD-10-CM | POA: Diagnosis not present

## 2022-04-13 DIAGNOSIS — N186 End stage renal disease: Secondary | ICD-10-CM | POA: Diagnosis not present

## 2022-04-13 DIAGNOSIS — N2581 Secondary hyperparathyroidism of renal origin: Secondary | ICD-10-CM | POA: Diagnosis not present

## 2022-04-13 DIAGNOSIS — T8249XD Other complication of vascular dialysis catheter, subsequent encounter: Secondary | ICD-10-CM | POA: Diagnosis not present

## 2022-04-16 DIAGNOSIS — T8249XD Other complication of vascular dialysis catheter, subsequent encounter: Secondary | ICD-10-CM | POA: Diagnosis not present

## 2022-04-16 DIAGNOSIS — T8249XA Other complication of vascular dialysis catheter, initial encounter: Secondary | ICD-10-CM | POA: Diagnosis not present

## 2022-04-16 DIAGNOSIS — N2581 Secondary hyperparathyroidism of renal origin: Secondary | ICD-10-CM | POA: Diagnosis not present

## 2022-04-16 DIAGNOSIS — Z23 Encounter for immunization: Secondary | ICD-10-CM | POA: Diagnosis not present

## 2022-04-16 DIAGNOSIS — T8249XS Other complication of vascular dialysis catheter, sequela: Secondary | ICD-10-CM | POA: Diagnosis not present

## 2022-04-16 DIAGNOSIS — Z992 Dependence on renal dialysis: Secondary | ICD-10-CM | POA: Diagnosis not present

## 2022-04-16 DIAGNOSIS — N186 End stage renal disease: Secondary | ICD-10-CM | POA: Diagnosis not present

## 2022-04-18 DIAGNOSIS — Z23 Encounter for immunization: Secondary | ICD-10-CM | POA: Diagnosis not present

## 2022-04-18 DIAGNOSIS — T8249XA Other complication of vascular dialysis catheter, initial encounter: Secondary | ICD-10-CM | POA: Diagnosis not present

## 2022-04-18 DIAGNOSIS — T8249XD Other complication of vascular dialysis catheter, subsequent encounter: Secondary | ICD-10-CM | POA: Diagnosis not present

## 2022-04-18 DIAGNOSIS — T8249XS Other complication of vascular dialysis catheter, sequela: Secondary | ICD-10-CM | POA: Diagnosis not present

## 2022-04-18 DIAGNOSIS — N2581 Secondary hyperparathyroidism of renal origin: Secondary | ICD-10-CM | POA: Diagnosis not present

## 2022-04-18 DIAGNOSIS — Z992 Dependence on renal dialysis: Secondary | ICD-10-CM | POA: Diagnosis not present

## 2022-04-18 DIAGNOSIS — N186 End stage renal disease: Secondary | ICD-10-CM | POA: Diagnosis not present

## 2022-04-20 DIAGNOSIS — T8249XS Other complication of vascular dialysis catheter, sequela: Secondary | ICD-10-CM | POA: Diagnosis not present

## 2022-04-20 DIAGNOSIS — Z992 Dependence on renal dialysis: Secondary | ICD-10-CM | POA: Diagnosis not present

## 2022-04-20 DIAGNOSIS — Z23 Encounter for immunization: Secondary | ICD-10-CM | POA: Diagnosis not present

## 2022-04-20 DIAGNOSIS — T8249XA Other complication of vascular dialysis catheter, initial encounter: Secondary | ICD-10-CM | POA: Diagnosis not present

## 2022-04-20 DIAGNOSIS — T8249XD Other complication of vascular dialysis catheter, subsequent encounter: Secondary | ICD-10-CM | POA: Diagnosis not present

## 2022-04-20 DIAGNOSIS — N186 End stage renal disease: Secondary | ICD-10-CM | POA: Diagnosis not present

## 2022-04-20 DIAGNOSIS — N2581 Secondary hyperparathyroidism of renal origin: Secondary | ICD-10-CM | POA: Diagnosis not present

## 2022-04-23 DIAGNOSIS — T8249XA Other complication of vascular dialysis catheter, initial encounter: Secondary | ICD-10-CM | POA: Diagnosis not present

## 2022-04-23 DIAGNOSIS — N2581 Secondary hyperparathyroidism of renal origin: Secondary | ICD-10-CM | POA: Diagnosis not present

## 2022-04-23 DIAGNOSIS — T8249XD Other complication of vascular dialysis catheter, subsequent encounter: Secondary | ICD-10-CM | POA: Diagnosis not present

## 2022-04-23 DIAGNOSIS — N186 End stage renal disease: Secondary | ICD-10-CM | POA: Diagnosis not present

## 2022-04-23 DIAGNOSIS — T8249XS Other complication of vascular dialysis catheter, sequela: Secondary | ICD-10-CM | POA: Diagnosis not present

## 2022-04-23 DIAGNOSIS — Z992 Dependence on renal dialysis: Secondary | ICD-10-CM | POA: Diagnosis not present

## 2022-04-25 DIAGNOSIS — T8249XS Other complication of vascular dialysis catheter, sequela: Secondary | ICD-10-CM | POA: Diagnosis not present

## 2022-04-25 DIAGNOSIS — N2581 Secondary hyperparathyroidism of renal origin: Secondary | ICD-10-CM | POA: Diagnosis not present

## 2022-04-25 DIAGNOSIS — T8249XA Other complication of vascular dialysis catheter, initial encounter: Secondary | ICD-10-CM | POA: Diagnosis not present

## 2022-04-25 DIAGNOSIS — N186 End stage renal disease: Secondary | ICD-10-CM | POA: Diagnosis not present

## 2022-04-25 DIAGNOSIS — T8249XD Other complication of vascular dialysis catheter, subsequent encounter: Secondary | ICD-10-CM | POA: Diagnosis not present

## 2022-04-25 DIAGNOSIS — Z992 Dependence on renal dialysis: Secondary | ICD-10-CM | POA: Diagnosis not present

## 2022-04-27 DIAGNOSIS — T8249XD Other complication of vascular dialysis catheter, subsequent encounter: Secondary | ICD-10-CM | POA: Diagnosis not present

## 2022-04-27 DIAGNOSIS — N2581 Secondary hyperparathyroidism of renal origin: Secondary | ICD-10-CM | POA: Diagnosis not present

## 2022-04-27 DIAGNOSIS — T8249XS Other complication of vascular dialysis catheter, sequela: Secondary | ICD-10-CM | POA: Diagnosis not present

## 2022-04-27 DIAGNOSIS — T8249XA Other complication of vascular dialysis catheter, initial encounter: Secondary | ICD-10-CM | POA: Diagnosis not present

## 2022-04-27 DIAGNOSIS — Z992 Dependence on renal dialysis: Secondary | ICD-10-CM | POA: Diagnosis not present

## 2022-04-27 DIAGNOSIS — N186 End stage renal disease: Secondary | ICD-10-CM | POA: Diagnosis not present

## 2022-04-29 ENCOUNTER — Telehealth: Payer: Self-pay

## 2022-04-29 NOTE — Telephone Encounter (Signed)
Received referral from Dr. Lady Deutscher for difficult cannulation, called pt to schedule fistulagram - pt declined, stated that he does not have insurance and would rather schedule this after he gets his insurance. Advised I would notify dialysis center. Pt verbalized understanding and agreed with this plan.

## 2022-04-30 DIAGNOSIS — N186 End stage renal disease: Secondary | ICD-10-CM | POA: Diagnosis not present

## 2022-04-30 DIAGNOSIS — T8249XA Other complication of vascular dialysis catheter, initial encounter: Secondary | ICD-10-CM | POA: Diagnosis not present

## 2022-04-30 DIAGNOSIS — Z992 Dependence on renal dialysis: Secondary | ICD-10-CM | POA: Diagnosis not present

## 2022-04-30 DIAGNOSIS — T8249XD Other complication of vascular dialysis catheter, subsequent encounter: Secondary | ICD-10-CM | POA: Diagnosis not present

## 2022-04-30 DIAGNOSIS — T8249XS Other complication of vascular dialysis catheter, sequela: Secondary | ICD-10-CM | POA: Diagnosis not present

## 2022-04-30 DIAGNOSIS — N2581 Secondary hyperparathyroidism of renal origin: Secondary | ICD-10-CM | POA: Diagnosis not present

## 2022-05-02 DIAGNOSIS — T8249XA Other complication of vascular dialysis catheter, initial encounter: Secondary | ICD-10-CM | POA: Diagnosis not present

## 2022-05-02 DIAGNOSIS — Z992 Dependence on renal dialysis: Secondary | ICD-10-CM | POA: Diagnosis not present

## 2022-05-02 DIAGNOSIS — N2581 Secondary hyperparathyroidism of renal origin: Secondary | ICD-10-CM | POA: Diagnosis not present

## 2022-05-02 DIAGNOSIS — T8249XD Other complication of vascular dialysis catheter, subsequent encounter: Secondary | ICD-10-CM | POA: Diagnosis not present

## 2022-05-02 DIAGNOSIS — T8249XS Other complication of vascular dialysis catheter, sequela: Secondary | ICD-10-CM | POA: Diagnosis not present

## 2022-05-02 DIAGNOSIS — N186 End stage renal disease: Secondary | ICD-10-CM | POA: Diagnosis not present

## 2022-05-04 DIAGNOSIS — T8249XS Other complication of vascular dialysis catheter, sequela: Secondary | ICD-10-CM | POA: Diagnosis not present

## 2022-05-04 DIAGNOSIS — T8249XD Other complication of vascular dialysis catheter, subsequent encounter: Secondary | ICD-10-CM | POA: Diagnosis not present

## 2022-05-04 DIAGNOSIS — N186 End stage renal disease: Secondary | ICD-10-CM | POA: Diagnosis not present

## 2022-05-04 DIAGNOSIS — T8249XA Other complication of vascular dialysis catheter, initial encounter: Secondary | ICD-10-CM | POA: Diagnosis not present

## 2022-05-04 DIAGNOSIS — N2581 Secondary hyperparathyroidism of renal origin: Secondary | ICD-10-CM | POA: Diagnosis not present

## 2022-05-04 DIAGNOSIS — Z992 Dependence on renal dialysis: Secondary | ICD-10-CM | POA: Diagnosis not present

## 2022-05-07 DIAGNOSIS — T8249XD Other complication of vascular dialysis catheter, subsequent encounter: Secondary | ICD-10-CM | POA: Diagnosis not present

## 2022-05-07 DIAGNOSIS — Z992 Dependence on renal dialysis: Secondary | ICD-10-CM | POA: Diagnosis not present

## 2022-05-07 DIAGNOSIS — T8249XS Other complication of vascular dialysis catheter, sequela: Secondary | ICD-10-CM | POA: Diagnosis not present

## 2022-05-07 DIAGNOSIS — T8249XA Other complication of vascular dialysis catheter, initial encounter: Secondary | ICD-10-CM | POA: Diagnosis not present

## 2022-05-07 DIAGNOSIS — N186 End stage renal disease: Secondary | ICD-10-CM | POA: Diagnosis not present

## 2022-05-07 DIAGNOSIS — N2581 Secondary hyperparathyroidism of renal origin: Secondary | ICD-10-CM | POA: Diagnosis not present

## 2022-05-09 DIAGNOSIS — N2581 Secondary hyperparathyroidism of renal origin: Secondary | ICD-10-CM | POA: Diagnosis not present

## 2022-05-09 DIAGNOSIS — T8249XD Other complication of vascular dialysis catheter, subsequent encounter: Secondary | ICD-10-CM | POA: Diagnosis not present

## 2022-05-09 DIAGNOSIS — T8249XS Other complication of vascular dialysis catheter, sequela: Secondary | ICD-10-CM | POA: Diagnosis not present

## 2022-05-09 DIAGNOSIS — T8249XA Other complication of vascular dialysis catheter, initial encounter: Secondary | ICD-10-CM | POA: Diagnosis not present

## 2022-05-09 DIAGNOSIS — N186 End stage renal disease: Secondary | ICD-10-CM | POA: Diagnosis not present

## 2022-05-09 DIAGNOSIS — Z992 Dependence on renal dialysis: Secondary | ICD-10-CM | POA: Diagnosis not present

## 2022-05-11 DIAGNOSIS — N2581 Secondary hyperparathyroidism of renal origin: Secondary | ICD-10-CM | POA: Diagnosis not present

## 2022-05-11 DIAGNOSIS — T8249XA Other complication of vascular dialysis catheter, initial encounter: Secondary | ICD-10-CM | POA: Diagnosis not present

## 2022-05-11 DIAGNOSIS — N186 End stage renal disease: Secondary | ICD-10-CM | POA: Diagnosis not present

## 2022-05-11 DIAGNOSIS — T8249XS Other complication of vascular dialysis catheter, sequela: Secondary | ICD-10-CM | POA: Diagnosis not present

## 2022-05-11 DIAGNOSIS — Z992 Dependence on renal dialysis: Secondary | ICD-10-CM | POA: Diagnosis not present

## 2022-05-11 DIAGNOSIS — T8249XD Other complication of vascular dialysis catheter, subsequent encounter: Secondary | ICD-10-CM | POA: Diagnosis not present

## 2022-05-14 DIAGNOSIS — T8249XD Other complication of vascular dialysis catheter, subsequent encounter: Secondary | ICD-10-CM | POA: Diagnosis not present

## 2022-05-14 DIAGNOSIS — N2581 Secondary hyperparathyroidism of renal origin: Secondary | ICD-10-CM | POA: Diagnosis not present

## 2022-05-14 DIAGNOSIS — N186 End stage renal disease: Secondary | ICD-10-CM | POA: Diagnosis not present

## 2022-05-14 DIAGNOSIS — T8249XA Other complication of vascular dialysis catheter, initial encounter: Secondary | ICD-10-CM | POA: Diagnosis not present

## 2022-05-14 DIAGNOSIS — Z992 Dependence on renal dialysis: Secondary | ICD-10-CM | POA: Diagnosis not present

## 2022-05-14 DIAGNOSIS — T8249XS Other complication of vascular dialysis catheter, sequela: Secondary | ICD-10-CM | POA: Diagnosis not present

## 2022-05-16 DIAGNOSIS — T8249XS Other complication of vascular dialysis catheter, sequela: Secondary | ICD-10-CM | POA: Diagnosis not present

## 2022-05-16 DIAGNOSIS — Z992 Dependence on renal dialysis: Secondary | ICD-10-CM | POA: Diagnosis not present

## 2022-05-16 DIAGNOSIS — T8249XD Other complication of vascular dialysis catheter, subsequent encounter: Secondary | ICD-10-CM | POA: Diagnosis not present

## 2022-05-16 DIAGNOSIS — N2581 Secondary hyperparathyroidism of renal origin: Secondary | ICD-10-CM | POA: Diagnosis not present

## 2022-05-16 DIAGNOSIS — T8249XA Other complication of vascular dialysis catheter, initial encounter: Secondary | ICD-10-CM | POA: Diagnosis not present

## 2022-05-16 DIAGNOSIS — N186 End stage renal disease: Secondary | ICD-10-CM | POA: Diagnosis not present

## 2022-05-18 DIAGNOSIS — T8249XD Other complication of vascular dialysis catheter, subsequent encounter: Secondary | ICD-10-CM | POA: Diagnosis not present

## 2022-05-18 DIAGNOSIS — T8249XA Other complication of vascular dialysis catheter, initial encounter: Secondary | ICD-10-CM | POA: Diagnosis not present

## 2022-05-18 DIAGNOSIS — T8249XS Other complication of vascular dialysis catheter, sequela: Secondary | ICD-10-CM | POA: Diagnosis not present

## 2022-05-18 DIAGNOSIS — N2581 Secondary hyperparathyroidism of renal origin: Secondary | ICD-10-CM | POA: Diagnosis not present

## 2022-05-18 DIAGNOSIS — Z992 Dependence on renal dialysis: Secondary | ICD-10-CM | POA: Diagnosis not present

## 2022-05-18 DIAGNOSIS — N186 End stage renal disease: Secondary | ICD-10-CM | POA: Diagnosis not present

## 2022-05-21 DIAGNOSIS — T8249XS Other complication of vascular dialysis catheter, sequela: Secondary | ICD-10-CM | POA: Diagnosis not present

## 2022-05-21 DIAGNOSIS — T8249XA Other complication of vascular dialysis catheter, initial encounter: Secondary | ICD-10-CM | POA: Diagnosis not present

## 2022-05-21 DIAGNOSIS — N186 End stage renal disease: Secondary | ICD-10-CM | POA: Diagnosis not present

## 2022-05-21 DIAGNOSIS — N2581 Secondary hyperparathyroidism of renal origin: Secondary | ICD-10-CM | POA: Diagnosis not present

## 2022-05-21 DIAGNOSIS — T8249XD Other complication of vascular dialysis catheter, subsequent encounter: Secondary | ICD-10-CM | POA: Diagnosis not present

## 2022-05-21 DIAGNOSIS — Z992 Dependence on renal dialysis: Secondary | ICD-10-CM | POA: Diagnosis not present

## 2022-05-23 DIAGNOSIS — T8249XA Other complication of vascular dialysis catheter, initial encounter: Secondary | ICD-10-CM | POA: Diagnosis not present

## 2022-05-23 DIAGNOSIS — Z992 Dependence on renal dialysis: Secondary | ICD-10-CM | POA: Diagnosis not present

## 2022-05-23 DIAGNOSIS — N186 End stage renal disease: Secondary | ICD-10-CM | POA: Diagnosis not present

## 2022-05-23 DIAGNOSIS — T8249XD Other complication of vascular dialysis catheter, subsequent encounter: Secondary | ICD-10-CM | POA: Diagnosis not present

## 2022-05-23 DIAGNOSIS — T8249XS Other complication of vascular dialysis catheter, sequela: Secondary | ICD-10-CM | POA: Diagnosis not present

## 2022-05-23 DIAGNOSIS — N2581 Secondary hyperparathyroidism of renal origin: Secondary | ICD-10-CM | POA: Diagnosis not present

## 2022-05-25 DIAGNOSIS — N186 End stage renal disease: Secondary | ICD-10-CM | POA: Diagnosis not present

## 2022-05-25 DIAGNOSIS — T8249XS Other complication of vascular dialysis catheter, sequela: Secondary | ICD-10-CM | POA: Diagnosis not present

## 2022-05-25 DIAGNOSIS — Z992 Dependence on renal dialysis: Secondary | ICD-10-CM | POA: Diagnosis not present

## 2022-05-25 DIAGNOSIS — T8249XD Other complication of vascular dialysis catheter, subsequent encounter: Secondary | ICD-10-CM | POA: Diagnosis not present

## 2022-05-25 DIAGNOSIS — N2581 Secondary hyperparathyroidism of renal origin: Secondary | ICD-10-CM | POA: Diagnosis not present

## 2022-05-25 DIAGNOSIS — T8249XA Other complication of vascular dialysis catheter, initial encounter: Secondary | ICD-10-CM | POA: Diagnosis not present

## 2022-05-28 DIAGNOSIS — N186 End stage renal disease: Secondary | ICD-10-CM | POA: Diagnosis not present

## 2022-05-28 DIAGNOSIS — Z992 Dependence on renal dialysis: Secondary | ICD-10-CM | POA: Diagnosis not present

## 2022-05-28 DIAGNOSIS — T8249XD Other complication of vascular dialysis catheter, subsequent encounter: Secondary | ICD-10-CM | POA: Diagnosis not present

## 2022-05-28 DIAGNOSIS — T8249XA Other complication of vascular dialysis catheter, initial encounter: Secondary | ICD-10-CM | POA: Diagnosis not present

## 2022-05-28 DIAGNOSIS — T8249XS Other complication of vascular dialysis catheter, sequela: Secondary | ICD-10-CM | POA: Diagnosis not present

## 2022-05-28 DIAGNOSIS — N2581 Secondary hyperparathyroidism of renal origin: Secondary | ICD-10-CM | POA: Diagnosis not present

## 2022-05-30 DIAGNOSIS — T8249XA Other complication of vascular dialysis catheter, initial encounter: Secondary | ICD-10-CM | POA: Diagnosis not present

## 2022-05-30 DIAGNOSIS — N2581 Secondary hyperparathyroidism of renal origin: Secondary | ICD-10-CM | POA: Diagnosis not present

## 2022-05-30 DIAGNOSIS — T8249XD Other complication of vascular dialysis catheter, subsequent encounter: Secondary | ICD-10-CM | POA: Diagnosis not present

## 2022-05-30 DIAGNOSIS — T8249XS Other complication of vascular dialysis catheter, sequela: Secondary | ICD-10-CM | POA: Diagnosis not present

## 2022-05-30 DIAGNOSIS — N186 End stage renal disease: Secondary | ICD-10-CM | POA: Diagnosis not present

## 2022-05-30 DIAGNOSIS — Z992 Dependence on renal dialysis: Secondary | ICD-10-CM | POA: Diagnosis not present

## 2022-06-01 DIAGNOSIS — Z992 Dependence on renal dialysis: Secondary | ICD-10-CM | POA: Diagnosis not present

## 2022-06-01 DIAGNOSIS — N186 End stage renal disease: Secondary | ICD-10-CM | POA: Diagnosis not present

## 2022-06-01 DIAGNOSIS — T8249XA Other complication of vascular dialysis catheter, initial encounter: Secondary | ICD-10-CM | POA: Diagnosis not present

## 2022-06-01 DIAGNOSIS — T8249XS Other complication of vascular dialysis catheter, sequela: Secondary | ICD-10-CM | POA: Diagnosis not present

## 2022-06-01 DIAGNOSIS — N2581 Secondary hyperparathyroidism of renal origin: Secondary | ICD-10-CM | POA: Diagnosis not present

## 2022-06-01 DIAGNOSIS — T8249XD Other complication of vascular dialysis catheter, subsequent encounter: Secondary | ICD-10-CM | POA: Diagnosis not present

## 2022-06-04 DIAGNOSIS — Z992 Dependence on renal dialysis: Secondary | ICD-10-CM | POA: Diagnosis not present

## 2022-06-04 DIAGNOSIS — N2581 Secondary hyperparathyroidism of renal origin: Secondary | ICD-10-CM | POA: Diagnosis not present

## 2022-06-04 DIAGNOSIS — N186 End stage renal disease: Secondary | ICD-10-CM | POA: Diagnosis not present

## 2022-06-04 DIAGNOSIS — T8249XS Other complication of vascular dialysis catheter, sequela: Secondary | ICD-10-CM | POA: Diagnosis not present

## 2022-06-04 DIAGNOSIS — T8249XD Other complication of vascular dialysis catheter, subsequent encounter: Secondary | ICD-10-CM | POA: Diagnosis not present

## 2022-06-04 DIAGNOSIS — T8249XA Other complication of vascular dialysis catheter, initial encounter: Secondary | ICD-10-CM | POA: Diagnosis not present

## 2022-06-05 ENCOUNTER — Telehealth: Payer: Self-pay

## 2022-06-05 NOTE — Telephone Encounter (Signed)
Pt called requesting an appt d/t dialysis issues at [facility] at [facility].  Reviewed pt's chart, returned call for clarification, two identifiers used. Pt had been referred by his nephrologist, but lost his insurance and could not do the fistulagram at that time. He now has insurance and wants to proceed since dialysis center is unable to use his access. Spoke with Gwenette Greet PA who confirmed no need to be seen in office before scheduling procedure. Msg sent to [facility] to schedule. Pt made aware. Confirmed understanding.

## 2022-06-06 DIAGNOSIS — T8249XD Other complication of vascular dialysis catheter, subsequent encounter: Secondary | ICD-10-CM | POA: Diagnosis not present

## 2022-06-06 DIAGNOSIS — T8249XS Other complication of vascular dialysis catheter, sequela: Secondary | ICD-10-CM | POA: Diagnosis not present

## 2022-06-06 DIAGNOSIS — Z992 Dependence on renal dialysis: Secondary | ICD-10-CM | POA: Diagnosis not present

## 2022-06-06 DIAGNOSIS — N186 End stage renal disease: Secondary | ICD-10-CM | POA: Diagnosis not present

## 2022-06-06 DIAGNOSIS — T8249XA Other complication of vascular dialysis catheter, initial encounter: Secondary | ICD-10-CM | POA: Diagnosis not present

## 2022-06-06 DIAGNOSIS — N2581 Secondary hyperparathyroidism of renal origin: Secondary | ICD-10-CM | POA: Diagnosis not present

## 2022-06-08 DIAGNOSIS — Z992 Dependence on renal dialysis: Secondary | ICD-10-CM | POA: Diagnosis not present

## 2022-06-08 DIAGNOSIS — N2581 Secondary hyperparathyroidism of renal origin: Secondary | ICD-10-CM | POA: Diagnosis not present

## 2022-06-08 DIAGNOSIS — T8249XD Other complication of vascular dialysis catheter, subsequent encounter: Secondary | ICD-10-CM | POA: Diagnosis not present

## 2022-06-08 DIAGNOSIS — N186 End stage renal disease: Secondary | ICD-10-CM | POA: Diagnosis not present

## 2022-06-08 DIAGNOSIS — T8249XA Other complication of vascular dialysis catheter, initial encounter: Secondary | ICD-10-CM | POA: Diagnosis not present

## 2022-06-08 DIAGNOSIS — T8249XS Other complication of vascular dialysis catheter, sequela: Secondary | ICD-10-CM | POA: Diagnosis not present

## 2022-06-11 DIAGNOSIS — Z992 Dependence on renal dialysis: Secondary | ICD-10-CM | POA: Diagnosis not present

## 2022-06-11 DIAGNOSIS — T8249XS Other complication of vascular dialysis catheter, sequela: Secondary | ICD-10-CM | POA: Diagnosis not present

## 2022-06-11 DIAGNOSIS — N2581 Secondary hyperparathyroidism of renal origin: Secondary | ICD-10-CM | POA: Diagnosis not present

## 2022-06-11 DIAGNOSIS — T8249XD Other complication of vascular dialysis catheter, subsequent encounter: Secondary | ICD-10-CM | POA: Diagnosis not present

## 2022-06-11 DIAGNOSIS — T8249XA Other complication of vascular dialysis catheter, initial encounter: Secondary | ICD-10-CM | POA: Diagnosis not present

## 2022-06-11 DIAGNOSIS — N186 End stage renal disease: Secondary | ICD-10-CM | POA: Diagnosis not present

## 2022-06-13 ENCOUNTER — Encounter (HOSPITAL_COMMUNITY): Payer: BC Managed Care – PPO

## 2022-06-13 ENCOUNTER — Ambulatory Visit: Payer: BC Managed Care – PPO

## 2022-06-13 DIAGNOSIS — N2581 Secondary hyperparathyroidism of renal origin: Secondary | ICD-10-CM | POA: Diagnosis not present

## 2022-06-13 DIAGNOSIS — Z992 Dependence on renal dialysis: Secondary | ICD-10-CM | POA: Diagnosis not present

## 2022-06-13 DIAGNOSIS — T8249XA Other complication of vascular dialysis catheter, initial encounter: Secondary | ICD-10-CM | POA: Diagnosis not present

## 2022-06-13 DIAGNOSIS — N186 End stage renal disease: Secondary | ICD-10-CM | POA: Diagnosis not present

## 2022-06-13 DIAGNOSIS — T8249XS Other complication of vascular dialysis catheter, sequela: Secondary | ICD-10-CM | POA: Diagnosis not present

## 2022-06-13 DIAGNOSIS — T8249XD Other complication of vascular dialysis catheter, subsequent encounter: Secondary | ICD-10-CM | POA: Diagnosis not present

## 2022-06-15 DIAGNOSIS — T8249XS Other complication of vascular dialysis catheter, sequela: Secondary | ICD-10-CM | POA: Diagnosis not present

## 2022-06-15 DIAGNOSIS — T8249XA Other complication of vascular dialysis catheter, initial encounter: Secondary | ICD-10-CM | POA: Diagnosis not present

## 2022-06-15 DIAGNOSIS — N186 End stage renal disease: Secondary | ICD-10-CM | POA: Diagnosis not present

## 2022-06-15 DIAGNOSIS — N2581 Secondary hyperparathyroidism of renal origin: Secondary | ICD-10-CM | POA: Diagnosis not present

## 2022-06-15 DIAGNOSIS — T8249XD Other complication of vascular dialysis catheter, subsequent encounter: Secondary | ICD-10-CM | POA: Diagnosis not present

## 2022-06-15 DIAGNOSIS — Z992 Dependence on renal dialysis: Secondary | ICD-10-CM | POA: Diagnosis not present

## 2022-06-18 ENCOUNTER — Other Ambulatory Visit: Payer: Self-pay

## 2022-06-18 DIAGNOSIS — T82590A Other mechanical complication of surgically created arteriovenous fistula, initial encounter: Secondary | ICD-10-CM

## 2022-06-18 DIAGNOSIS — N186 End stage renal disease: Secondary | ICD-10-CM | POA: Diagnosis not present

## 2022-06-18 DIAGNOSIS — N2581 Secondary hyperparathyroidism of renal origin: Secondary | ICD-10-CM | POA: Diagnosis not present

## 2022-06-18 DIAGNOSIS — T8249XD Other complication of vascular dialysis catheter, subsequent encounter: Secondary | ICD-10-CM | POA: Diagnosis not present

## 2022-06-18 DIAGNOSIS — Z992 Dependence on renal dialysis: Secondary | ICD-10-CM | POA: Diagnosis not present

## 2022-06-18 DIAGNOSIS — T8249XA Other complication of vascular dialysis catheter, initial encounter: Secondary | ICD-10-CM | POA: Diagnosis not present

## 2022-06-18 DIAGNOSIS — T8249XS Other complication of vascular dialysis catheter, sequela: Secondary | ICD-10-CM | POA: Diagnosis not present

## 2022-06-19 ENCOUNTER — Ambulatory Visit (HOSPITAL_COMMUNITY)
Admission: RE | Admit: 2022-06-19 | Discharge: 2022-06-19 | Disposition: A | Discharge status: Home / Self Care | Payer: BC Managed Care – PPO | Source: Ambulatory Visit | Attending: Vascular Surgery | Admitting: Vascular Surgery

## 2022-06-19 ENCOUNTER — Encounter (HOSPITAL_COMMUNITY)
Admission: RE | Disposition: A | Discharge status: Home / Self Care | Payer: Self-pay | Source: Ambulatory Visit | Attending: Vascular Surgery

## 2022-06-19 ENCOUNTER — Other Ambulatory Visit: Payer: Self-pay

## 2022-06-19 DIAGNOSIS — Y841 Kidney dialysis as the cause of abnormal reaction of the patient, or of later complication, without mention of misadventure at the time of the procedure: Secondary | ICD-10-CM | POA: Insufficient documentation

## 2022-06-19 DIAGNOSIS — T82898A Other specified complication of vascular prosthetic devices, implants and grafts, initial encounter: Secondary | ICD-10-CM | POA: Diagnosis not present

## 2022-06-19 DIAGNOSIS — Z992 Dependence on renal dialysis: Secondary | ICD-10-CM | POA: Diagnosis not present

## 2022-06-19 DIAGNOSIS — N186 End stage renal disease: Secondary | ICD-10-CM | POA: Diagnosis not present

## 2022-06-19 DIAGNOSIS — T82590A Other mechanical complication of surgically created arteriovenous fistula, initial encounter: Secondary | ICD-10-CM

## 2022-06-19 HISTORY — PX: A/V FISTULAGRAM: CATH118298

## 2022-06-19 LAB — POCT I-STAT, CHEM 8
BUN: 35 mg/dL — ABNORMAL HIGH (ref 6–20)
Calcium, Ion: 1.23 mmol/L (ref 1.15–1.40)
Chloride: 101 mmol/L (ref 98–111)
Creatinine, Ser: 8.4 mg/dL — ABNORMAL HIGH (ref 0.61–1.24)
Glucose, Bld: 96 mg/dL (ref 70–99)
HCT: 38 % — ABNORMAL LOW (ref 39.0–52.0)
Hemoglobin: 12.9 g/dL — ABNORMAL LOW (ref 13.0–17.0)
Potassium: 5.5 mmol/L — ABNORMAL HIGH (ref 3.5–5.1)
Sodium: 138 mmol/L (ref 135–145)
TCO2: 29 mmol/L (ref 22–32)

## 2022-06-19 SURGERY — A/V FISTULAGRAM
Anesthesia: LOCAL | Laterality: Left

## 2022-06-19 MED ORDER — HEPARIN (PORCINE) IN NACL 1000-0.9 UT/500ML-% IV SOLN
INTRAVENOUS | Status: AC
  Filled 2022-06-19: qty 500

## 2022-06-19 MED ORDER — SODIUM CHLORIDE 0.9% FLUSH: 3.0000 mL | Freq: Two times a day (BID) | INTRAVENOUS | Status: DC

## 2022-06-19 MED ORDER — LIDOCAINE HCL (PF) 1 % IJ SOLN
INTRAMUSCULAR | Status: AC
  Filled 2022-06-19: qty 30

## 2022-06-19 MED ORDER — LIDOCAINE HCL (PF) 1 % IJ SOLN
INTRAMUSCULAR | Status: DC | PRN
  Administered 2022-06-19: 2 mL

## 2022-06-19 MED ORDER — SODIUM CHLORIDE 0.9% FLUSH: 3.0000 mL | INTRAVENOUS | Status: DC | PRN

## 2022-06-19 MED ORDER — IODIXANOL 320 MG/ML IV SOLN
INTRAVENOUS | Status: DC | PRN
  Administered 2022-06-19: 35 mL

## 2022-06-19 MED ORDER — HEPARIN (PORCINE) IN NACL 1000-0.9 UT/500ML-% IV SOLN
INTRAVENOUS | Status: DC | PRN
  Administered 2022-06-19: 500 mL

## 2022-06-19 MED ORDER — SODIUM CHLORIDE 0.9 % IV SOLN: 250.0000 mL | INTRAVENOUS | Status: DC | PRN

## 2022-06-19 SURGICAL SUPPLY — 9 items
BAG SNAP BAND KOVER 36X36 (MISCELLANEOUS) ×1 IMPLANT
COVER DOME SNAP 22 D (MISCELLANEOUS) ×1 IMPLANT
KIT MICROPUNCTURE NIT STIFF (SHEATH) IMPLANT
PROTECTION STATION PRESSURIZED (MISCELLANEOUS) ×1
SHEATH PROBE COVER 6X72 (BAG) ×1 IMPLANT
STATION PROTECTION PRESSURIZED (MISCELLANEOUS) ×1 IMPLANT
STOPCOCK MORSE 400PSI 3WAY (MISCELLANEOUS) ×1 IMPLANT
TRAY PV CATH (CUSTOM PROCEDURE TRAY) ×1 IMPLANT
TUBING CIL FLEX 10 FLL-RA (TUBING) ×1 IMPLANT

## 2022-06-19 NOTE — Progress Notes (Signed)
Patient was given discharge instructions. He verbalized understanding. 

## 2022-06-20 DIAGNOSIS — T8249XS Other complication of vascular dialysis catheter, sequela: Secondary | ICD-10-CM | POA: Diagnosis not present

## 2022-06-20 DIAGNOSIS — T8249XA Other complication of vascular dialysis catheter, initial encounter: Secondary | ICD-10-CM | POA: Diagnosis not present

## 2022-06-20 DIAGNOSIS — Z992 Dependence on renal dialysis: Secondary | ICD-10-CM | POA: Diagnosis not present

## 2022-06-20 DIAGNOSIS — T8249XD Other complication of vascular dialysis catheter, subsequent encounter: Secondary | ICD-10-CM | POA: Diagnosis not present

## 2022-06-20 DIAGNOSIS — N2581 Secondary hyperparathyroidism of renal origin: Secondary | ICD-10-CM | POA: Diagnosis not present

## 2022-06-20 DIAGNOSIS — N186 End stage renal disease: Secondary | ICD-10-CM | POA: Diagnosis not present

## 2022-06-20 NOTE — Op Note (Signed)
    Patient name: Allen Mathis MRN: [id number] DOB: 1966/07/19 Sex: male  06/19/2022 Pre-operative Diagnosis: Fistula malfunction Post-operative diagnosis:  Same Surgeon:  [name], MD Procedure Performed: 1.  Ultrasound-guided micropuncture access of left radiocephalic fistula 2.  Fistulogram  Indications: Patient is a 56 year old male with history of left-sided radiocephalic fistula.  This fistula was being used without issue until recently after an episode of infiltration which left hematoma.  I have discussed the risk and benefits of fistulogram in an effort to define and treat possible reasons for difficult access, C[place] elected to proceed.  Findings: Left radiocephalic fistula widely patent with no flow-limiting stenosis, small branches proximally, however these do not appear to impact flow.   Procedure: Patient brought to the Cath Lab laid in supine position.  He was prepped and draped in standard fashion a timeout was performed.  Local anesthesia was used at the site of access.  An ultrasound-guided micropuncture needle was used to access 1 cm from the radiocephalic fistula anastomosis.  A small micropuncture sheath was placed.  Fistulogram followed.  See results above.  Pressure was held for hemostasis.  Impression: No flow-limiting stenosis appreciated in the left radiocephalic fistula.  Recommend attempting access.   Cassandria Santee, MD [facility] [place] Office: [phone]

## 2022-06-20 NOTE — H&P (Signed)
Patient seen and examined in preop holding.  No complaints. No changes to medication history or physical exam since last seen in clinic. After discussing the risks and benefits of fistulagram, Allen Mathis elected to proceed.   [name] MD    History of Present Illness   Allen Mathis is a 56 y.o. year old male who presents for postoperative follow-up for:   Left radiocephalic AV fistula by Dr. Scot Dock on 11/09/21.The patient's wounds are well healed.  The patient notes very mild steal symptoms. Has very intermittent tingling in his fingers. The patient is  able to complete their activities of daily living.    Allen Mathis currently dialyzes via a right IJ TDC on TTS at the [facility] location Physical Examination   Vitals:   06/19/22 1502 06/19/22 1520 06/19/22 1535 06/19/22 1550  BP: (!) 148/76 135/65 121/76 (!) 142/75  Pulse: (!) 58 (!) 57 (!) 57 60  Resp: 19     Temp:      TempSrc:      SpO2: 98% 99% 98% 100%  Weight:      Height:       Body mass index is 29.12 kg/m.  left arm Incision is well healed, 2+ radial pulse, hand grip is 5/5, sensation in digits is intact, palpable thrill, bruit can be auscultated. Fistula is easily palpable in the left forearm    Non invasive vascular lab: Findings:  +--------------------+----------+-----------------+--------+  AVF                 PSV (cm/s)Flow Vol (mL/min)Comments  +--------------------+----------+-----------------+--------+  Native artery inflow   240           612                 +--------------------+----------+-----------------+--------+  AVF Anastomosis        340                               +--------------------+----------+-----------------+--------+    +------------+----------+-------------+----------+----------------+  OUTFLOW VEINPSV (cm/s)Diameter (cm)Depth (cm)    Describe      +------------+----------+-------------+----------+----------------+  AC Fossa        86         0.50        0.32                     +------------+----------+-------------+----------+----------------+  Prox Forearm   104        0.64        0.27                     +------------+----------+-------------+----------+----------------+  Mid Forearm    202        0.47        0.25   competing branch  +------------+----------+-------------+----------+----------------+  Dist Forearm   382        0.32        0.28                     +------------+----------+-------------+----------+----------------+    Summary:  Patent left arteriovenous fistula.  Arteriovenous fistula-Velocities less than 100cm/s noted (AC Fossa).  Medical Decision Making   Allen Mathis is a 56 y.o. year old male who presents s/p  Left radiocephalic AV fistula by Dr. Scot Dock on 11/09/21.Incision is well healed. Fistula is patent and with great thrill. Duplex shows patent fistula with good volume  flow. Fistula has matured nicely. There is mid portion of narrowing where there is branch. I don't think this will effect cannulation. It is okay for him to return to work without restrictions.  Patent is without signs or symptoms of steal syndrome The patient's access will be ready for use after 02/06/22 The patient's tunneled dialysis catheter can be removed when Nephrology is comfortable with the performance of the left RC AV fistula The patient may follow up on a prn basis   [name], PA-C [facility] [place] Office: [phone]  Clinic MD: Dr. Scot Dock

## 2022-06-21 ENCOUNTER — Encounter (HOSPITAL_COMMUNITY): Payer: Self-pay | Admitting: Vascular Surgery

## 2022-06-22 DIAGNOSIS — N2581 Secondary hyperparathyroidism of renal origin: Secondary | ICD-10-CM | POA: Diagnosis not present

## 2022-06-22 DIAGNOSIS — T8249XS Other complication of vascular dialysis catheter, sequela: Secondary | ICD-10-CM | POA: Diagnosis not present

## 2022-06-22 DIAGNOSIS — Z992 Dependence on renal dialysis: Secondary | ICD-10-CM | POA: Diagnosis not present

## 2022-06-22 DIAGNOSIS — T8249XD Other complication of vascular dialysis catheter, subsequent encounter: Secondary | ICD-10-CM | POA: Diagnosis not present

## 2022-06-22 DIAGNOSIS — N186 End stage renal disease: Secondary | ICD-10-CM | POA: Diagnosis not present

## 2022-06-22 DIAGNOSIS — T8249XA Other complication of vascular dialysis catheter, initial encounter: Secondary | ICD-10-CM | POA: Diagnosis not present

## 2022-06-25 DIAGNOSIS — N186 End stage renal disease: Secondary | ICD-10-CM | POA: Diagnosis not present

## 2022-06-25 DIAGNOSIS — T8249XD Other complication of vascular dialysis catheter, subsequent encounter: Secondary | ICD-10-CM | POA: Diagnosis not present

## 2022-06-25 DIAGNOSIS — Z23 Encounter for immunization: Secondary | ICD-10-CM | POA: Diagnosis not present

## 2022-06-25 DIAGNOSIS — T8249XS Other complication of vascular dialysis catheter, sequela: Secondary | ICD-10-CM | POA: Diagnosis not present

## 2022-06-25 DIAGNOSIS — T8249XA Other complication of vascular dialysis catheter, initial encounter: Secondary | ICD-10-CM | POA: Diagnosis not present

## 2022-06-25 DIAGNOSIS — Z992 Dependence on renal dialysis: Secondary | ICD-10-CM | POA: Diagnosis not present

## 2022-06-25 DIAGNOSIS — N2581 Secondary hyperparathyroidism of renal origin: Secondary | ICD-10-CM | POA: Diagnosis not present

## 2022-06-27 DIAGNOSIS — N2581 Secondary hyperparathyroidism of renal origin: Secondary | ICD-10-CM | POA: Diagnosis not present

## 2022-06-27 DIAGNOSIS — T8249XA Other complication of vascular dialysis catheter, initial encounter: Secondary | ICD-10-CM | POA: Diagnosis not present

## 2022-06-27 DIAGNOSIS — T8249XD Other complication of vascular dialysis catheter, subsequent encounter: Secondary | ICD-10-CM | POA: Diagnosis not present

## 2022-06-27 DIAGNOSIS — Z992 Dependence on renal dialysis: Secondary | ICD-10-CM | POA: Diagnosis not present

## 2022-06-27 DIAGNOSIS — Z23 Encounter for immunization: Secondary | ICD-10-CM | POA: Diagnosis not present

## 2022-06-27 DIAGNOSIS — T8249XS Other complication of vascular dialysis catheter, sequela: Secondary | ICD-10-CM | POA: Diagnosis not present

## 2022-06-27 DIAGNOSIS — N186 End stage renal disease: Secondary | ICD-10-CM | POA: Diagnosis not present

## 2022-06-29 DIAGNOSIS — T8249XS Other complication of vascular dialysis catheter, sequela: Secondary | ICD-10-CM | POA: Diagnosis not present

## 2022-06-29 DIAGNOSIS — T8249XA Other complication of vascular dialysis catheter, initial encounter: Secondary | ICD-10-CM | POA: Diagnosis not present

## 2022-06-29 DIAGNOSIS — Z23 Encounter for immunization: Secondary | ICD-10-CM | POA: Diagnosis not present

## 2022-06-29 DIAGNOSIS — T8249XD Other complication of vascular dialysis catheter, subsequent encounter: Secondary | ICD-10-CM | POA: Diagnosis not present

## 2022-06-29 DIAGNOSIS — N186 End stage renal disease: Secondary | ICD-10-CM | POA: Diagnosis not present

## 2022-06-29 DIAGNOSIS — N2581 Secondary hyperparathyroidism of renal origin: Secondary | ICD-10-CM | POA: Diagnosis not present

## 2022-06-29 DIAGNOSIS — Z992 Dependence on renal dialysis: Secondary | ICD-10-CM | POA: Diagnosis not present

## 2022-07-02 DIAGNOSIS — N2581 Secondary hyperparathyroidism of renal origin: Secondary | ICD-10-CM | POA: Diagnosis not present

## 2022-07-02 DIAGNOSIS — T8249XS Other complication of vascular dialysis catheter, sequela: Secondary | ICD-10-CM | POA: Diagnosis not present

## 2022-07-02 DIAGNOSIS — T8249XD Other complication of vascular dialysis catheter, subsequent encounter: Secondary | ICD-10-CM | POA: Diagnosis not present

## 2022-07-02 DIAGNOSIS — T8249XA Other complication of vascular dialysis catheter, initial encounter: Secondary | ICD-10-CM | POA: Diagnosis not present

## 2022-07-02 DIAGNOSIS — N186 End stage renal disease: Secondary | ICD-10-CM | POA: Diagnosis not present

## 2022-07-02 DIAGNOSIS — Z992 Dependence on renal dialysis: Secondary | ICD-10-CM | POA: Diagnosis not present

## 2022-07-04 DIAGNOSIS — Z992 Dependence on renal dialysis: Secondary | ICD-10-CM | POA: Diagnosis not present

## 2022-07-04 DIAGNOSIS — T8249XD Other complication of vascular dialysis catheter, subsequent encounter: Secondary | ICD-10-CM | POA: Diagnosis not present

## 2022-07-04 DIAGNOSIS — N186 End stage renal disease: Secondary | ICD-10-CM | POA: Diagnosis not present

## 2022-07-04 DIAGNOSIS — N2581 Secondary hyperparathyroidism of renal origin: Secondary | ICD-10-CM | POA: Diagnosis not present

## 2022-07-04 DIAGNOSIS — T8249XA Other complication of vascular dialysis catheter, initial encounter: Secondary | ICD-10-CM | POA: Diagnosis not present

## 2022-07-04 DIAGNOSIS — T8249XS Other complication of vascular dialysis catheter, sequela: Secondary | ICD-10-CM | POA: Diagnosis not present

## 2022-07-06 DIAGNOSIS — T8249XD Other complication of vascular dialysis catheter, subsequent encounter: Secondary | ICD-10-CM | POA: Diagnosis not present

## 2022-07-06 DIAGNOSIS — N2581 Secondary hyperparathyroidism of renal origin: Secondary | ICD-10-CM | POA: Diagnosis not present

## 2022-07-06 DIAGNOSIS — N186 End stage renal disease: Secondary | ICD-10-CM | POA: Diagnosis not present

## 2022-07-06 DIAGNOSIS — T8249XS Other complication of vascular dialysis catheter, sequela: Secondary | ICD-10-CM | POA: Diagnosis not present

## 2022-07-06 DIAGNOSIS — Z992 Dependence on renal dialysis: Secondary | ICD-10-CM | POA: Diagnosis not present

## 2022-07-06 DIAGNOSIS — T8249XA Other complication of vascular dialysis catheter, initial encounter: Secondary | ICD-10-CM | POA: Diagnosis not present

## 2022-07-09 DIAGNOSIS — T8249XA Other complication of vascular dialysis catheter, initial encounter: Secondary | ICD-10-CM | POA: Diagnosis not present

## 2022-07-09 DIAGNOSIS — T8249XS Other complication of vascular dialysis catheter, sequela: Secondary | ICD-10-CM | POA: Diagnosis not present

## 2022-07-09 DIAGNOSIS — N186 End stage renal disease: Secondary | ICD-10-CM | POA: Diagnosis not present

## 2022-07-09 DIAGNOSIS — Z992 Dependence on renal dialysis: Secondary | ICD-10-CM | POA: Diagnosis not present

## 2022-07-09 DIAGNOSIS — N2581 Secondary hyperparathyroidism of renal origin: Secondary | ICD-10-CM | POA: Diagnosis not present

## 2022-07-09 DIAGNOSIS — T8249XD Other complication of vascular dialysis catheter, subsequent encounter: Secondary | ICD-10-CM | POA: Diagnosis not present

## 2022-07-11 DIAGNOSIS — T8249XS Other complication of vascular dialysis catheter, sequela: Secondary | ICD-10-CM | POA: Diagnosis not present

## 2022-07-11 DIAGNOSIS — T8249XD Other complication of vascular dialysis catheter, subsequent encounter: Secondary | ICD-10-CM | POA: Diagnosis not present

## 2022-07-11 DIAGNOSIS — N2581 Secondary hyperparathyroidism of renal origin: Secondary | ICD-10-CM | POA: Diagnosis not present

## 2022-07-11 DIAGNOSIS — T8249XA Other complication of vascular dialysis catheter, initial encounter: Secondary | ICD-10-CM | POA: Diagnosis not present

## 2022-07-11 DIAGNOSIS — N186 End stage renal disease: Secondary | ICD-10-CM | POA: Diagnosis not present

## 2022-07-11 DIAGNOSIS — Z992 Dependence on renal dialysis: Secondary | ICD-10-CM | POA: Diagnosis not present

## 2022-07-13 DIAGNOSIS — T8249XS Other complication of vascular dialysis catheter, sequela: Secondary | ICD-10-CM | POA: Diagnosis not present

## 2022-07-13 DIAGNOSIS — N186 End stage renal disease: Secondary | ICD-10-CM | POA: Diagnosis not present

## 2022-07-13 DIAGNOSIS — Z992 Dependence on renal dialysis: Secondary | ICD-10-CM | POA: Diagnosis not present

## 2022-07-13 DIAGNOSIS — T8249XA Other complication of vascular dialysis catheter, initial encounter: Secondary | ICD-10-CM | POA: Diagnosis not present

## 2022-07-13 DIAGNOSIS — T8249XD Other complication of vascular dialysis catheter, subsequent encounter: Secondary | ICD-10-CM | POA: Diagnosis not present

## 2022-07-13 DIAGNOSIS — N2581 Secondary hyperparathyroidism of renal origin: Secondary | ICD-10-CM | POA: Diagnosis not present

## 2022-07-16 DIAGNOSIS — Z992 Dependence on renal dialysis: Secondary | ICD-10-CM | POA: Diagnosis not present

## 2022-07-16 DIAGNOSIS — T8249XD Other complication of vascular dialysis catheter, subsequent encounter: Secondary | ICD-10-CM | POA: Diagnosis not present

## 2022-07-16 DIAGNOSIS — T8249XA Other complication of vascular dialysis catheter, initial encounter: Secondary | ICD-10-CM | POA: Diagnosis not present

## 2022-07-16 DIAGNOSIS — T8249XS Other complication of vascular dialysis catheter, sequela: Secondary | ICD-10-CM | POA: Diagnosis not present

## 2022-07-16 DIAGNOSIS — N2581 Secondary hyperparathyroidism of renal origin: Secondary | ICD-10-CM | POA: Diagnosis not present

## 2022-07-16 DIAGNOSIS — N186 End stage renal disease: Secondary | ICD-10-CM | POA: Diagnosis not present

## 2022-07-18 DIAGNOSIS — T8249XS Other complication of vascular dialysis catheter, sequela: Secondary | ICD-10-CM | POA: Diagnosis not present

## 2022-07-18 DIAGNOSIS — T8249XA Other complication of vascular dialysis catheter, initial encounter: Secondary | ICD-10-CM | POA: Diagnosis not present

## 2022-07-18 DIAGNOSIS — N186 End stage renal disease: Secondary | ICD-10-CM | POA: Diagnosis not present

## 2022-07-18 DIAGNOSIS — T8249XD Other complication of vascular dialysis catheter, subsequent encounter: Secondary | ICD-10-CM | POA: Diagnosis not present

## 2022-07-18 DIAGNOSIS — N2581 Secondary hyperparathyroidism of renal origin: Secondary | ICD-10-CM | POA: Diagnosis not present

## 2022-07-18 DIAGNOSIS — Z992 Dependence on renal dialysis: Secondary | ICD-10-CM | POA: Diagnosis not present

## 2022-07-20 DIAGNOSIS — T8249XS Other complication of vascular dialysis catheter, sequela: Secondary | ICD-10-CM | POA: Diagnosis not present

## 2022-07-20 DIAGNOSIS — T8249XD Other complication of vascular dialysis catheter, subsequent encounter: Secondary | ICD-10-CM | POA: Diagnosis not present

## 2022-07-20 DIAGNOSIS — N186 End stage renal disease: Secondary | ICD-10-CM | POA: Diagnosis not present

## 2022-07-20 DIAGNOSIS — T8249XA Other complication of vascular dialysis catheter, initial encounter: Secondary | ICD-10-CM | POA: Diagnosis not present

## 2022-07-20 DIAGNOSIS — Z992 Dependence on renal dialysis: Secondary | ICD-10-CM | POA: Diagnosis not present

## 2022-07-20 DIAGNOSIS — N2581 Secondary hyperparathyroidism of renal origin: Secondary | ICD-10-CM | POA: Diagnosis not present

## 2022-07-23 DIAGNOSIS — T8249XD Other complication of vascular dialysis catheter, subsequent encounter: Secondary | ICD-10-CM | POA: Diagnosis not present

## 2022-07-23 DIAGNOSIS — N186 End stage renal disease: Secondary | ICD-10-CM | POA: Diagnosis not present

## 2022-07-23 DIAGNOSIS — Z992 Dependence on renal dialysis: Secondary | ICD-10-CM | POA: Diagnosis not present

## 2022-07-23 DIAGNOSIS — T8249XS Other complication of vascular dialysis catheter, sequela: Secondary | ICD-10-CM | POA: Diagnosis not present

## 2022-07-23 DIAGNOSIS — T8249XA Other complication of vascular dialysis catheter, initial encounter: Secondary | ICD-10-CM | POA: Diagnosis not present

## 2022-07-23 DIAGNOSIS — N2581 Secondary hyperparathyroidism of renal origin: Secondary | ICD-10-CM | POA: Diagnosis not present

## 2022-07-24 DIAGNOSIS — Z452 Encounter for adjustment and management of vascular access device: Secondary | ICD-10-CM | POA: Diagnosis not present

## 2022-07-24 DIAGNOSIS — Z992 Dependence on renal dialysis: Secondary | ICD-10-CM | POA: Diagnosis not present

## 2022-07-24 DIAGNOSIS — N186 End stage renal disease: Secondary | ICD-10-CM | POA: Diagnosis not present

## 2022-07-25 DIAGNOSIS — T8249XS Other complication of vascular dialysis catheter, sequela: Secondary | ICD-10-CM | POA: Diagnosis not present

## 2022-07-25 DIAGNOSIS — Z992 Dependence on renal dialysis: Secondary | ICD-10-CM | POA: Diagnosis not present

## 2022-07-25 DIAGNOSIS — T8249XA Other complication of vascular dialysis catheter, initial encounter: Secondary | ICD-10-CM | POA: Diagnosis not present

## 2022-07-25 DIAGNOSIS — T8249XD Other complication of vascular dialysis catheter, subsequent encounter: Secondary | ICD-10-CM | POA: Diagnosis not present

## 2022-07-25 DIAGNOSIS — N2581 Secondary hyperparathyroidism of renal origin: Secondary | ICD-10-CM | POA: Diagnosis not present

## 2022-07-25 DIAGNOSIS — N186 End stage renal disease: Secondary | ICD-10-CM | POA: Diagnosis not present

## 2022-07-27 DIAGNOSIS — T8249XD Other complication of vascular dialysis catheter, subsequent encounter: Secondary | ICD-10-CM | POA: Diagnosis not present

## 2022-07-27 DIAGNOSIS — Z992 Dependence on renal dialysis: Secondary | ICD-10-CM | POA: Diagnosis not present

## 2022-07-27 DIAGNOSIS — N186 End stage renal disease: Secondary | ICD-10-CM | POA: Diagnosis not present

## 2022-07-27 DIAGNOSIS — N2581 Secondary hyperparathyroidism of renal origin: Secondary | ICD-10-CM | POA: Diagnosis not present

## 2022-07-27 DIAGNOSIS — T8249XA Other complication of vascular dialysis catheter, initial encounter: Secondary | ICD-10-CM | POA: Diagnosis not present

## 2022-07-27 DIAGNOSIS — T8249XS Other complication of vascular dialysis catheter, sequela: Secondary | ICD-10-CM | POA: Diagnosis not present

## 2022-07-30 DIAGNOSIS — Z992 Dependence on renal dialysis: Secondary | ICD-10-CM | POA: Diagnosis not present

## 2022-07-30 DIAGNOSIS — N186 End stage renal disease: Secondary | ICD-10-CM | POA: Diagnosis not present

## 2022-07-30 DIAGNOSIS — N2581 Secondary hyperparathyroidism of renal origin: Secondary | ICD-10-CM | POA: Diagnosis not present

## 2022-08-01 DIAGNOSIS — I771 Stricture of artery: Secondary | ICD-10-CM | POA: Diagnosis not present

## 2022-08-01 DIAGNOSIS — Z992 Dependence on renal dialysis: Secondary | ICD-10-CM | POA: Diagnosis not present

## 2022-08-01 DIAGNOSIS — N186 End stage renal disease: Secondary | ICD-10-CM | POA: Diagnosis not present

## 2022-08-01 DIAGNOSIS — I871 Compression of vein: Secondary | ICD-10-CM | POA: Diagnosis not present

## 2022-08-02 DIAGNOSIS — N186 End stage renal disease: Secondary | ICD-10-CM | POA: Diagnosis not present

## 2022-08-02 DIAGNOSIS — Z992 Dependence on renal dialysis: Secondary | ICD-10-CM | POA: Diagnosis not present

## 2022-08-02 DIAGNOSIS — N2581 Secondary hyperparathyroidism of renal origin: Secondary | ICD-10-CM | POA: Diagnosis not present

## 2022-08-03 DIAGNOSIS — N186 End stage renal disease: Secondary | ICD-10-CM | POA: Diagnosis not present

## 2022-08-03 DIAGNOSIS — Z992 Dependence on renal dialysis: Secondary | ICD-10-CM | POA: Diagnosis not present

## 2022-08-03 DIAGNOSIS — N2581 Secondary hyperparathyroidism of renal origin: Secondary | ICD-10-CM | POA: Diagnosis not present

## 2022-08-06 DIAGNOSIS — T82898A Other specified complication of vascular prosthetic devices, implants and grafts, initial encounter: Secondary | ICD-10-CM | POA: Diagnosis not present

## 2022-08-06 DIAGNOSIS — E1122 Type 2 diabetes mellitus with diabetic chronic kidney disease: Secondary | ICD-10-CM | POA: Diagnosis not present

## 2022-08-06 DIAGNOSIS — N2581 Secondary hyperparathyroidism of renal origin: Secondary | ICD-10-CM | POA: Diagnosis not present

## 2022-08-06 DIAGNOSIS — N186 End stage renal disease: Secondary | ICD-10-CM | POA: Diagnosis not present

## 2022-08-06 DIAGNOSIS — Z992 Dependence on renal dialysis: Secondary | ICD-10-CM | POA: Diagnosis not present

## 2022-08-07 DIAGNOSIS — E8770 Fluid overload, unspecified: Secondary | ICD-10-CM | POA: Diagnosis not present

## 2022-08-07 DIAGNOSIS — N2581 Secondary hyperparathyroidism of renal origin: Secondary | ICD-10-CM | POA: Diagnosis not present

## 2022-08-07 DIAGNOSIS — T8249XD Other complication of vascular dialysis catheter, subsequent encounter: Secondary | ICD-10-CM | POA: Diagnosis not present

## 2022-08-07 DIAGNOSIS — N186 End stage renal disease: Secondary | ICD-10-CM | POA: Diagnosis not present

## 2022-08-07 DIAGNOSIS — Z992 Dependence on renal dialysis: Secondary | ICD-10-CM | POA: Diagnosis not present

## 2022-08-08 DIAGNOSIS — E8770 Fluid overload, unspecified: Secondary | ICD-10-CM | POA: Diagnosis not present

## 2022-08-08 DIAGNOSIS — N186 End stage renal disease: Secondary | ICD-10-CM | POA: Diagnosis not present

## 2022-08-08 DIAGNOSIS — Z992 Dependence on renal dialysis: Secondary | ICD-10-CM | POA: Diagnosis not present

## 2022-08-08 DIAGNOSIS — N2581 Secondary hyperparathyroidism of renal origin: Secondary | ICD-10-CM | POA: Diagnosis not present

## 2022-08-08 DIAGNOSIS — T8249XD Other complication of vascular dialysis catheter, subsequent encounter: Secondary | ICD-10-CM | POA: Diagnosis not present

## 2022-08-10 DIAGNOSIS — T8249XD Other complication of vascular dialysis catheter, subsequent encounter: Secondary | ICD-10-CM | POA: Diagnosis not present

## 2022-08-10 DIAGNOSIS — N2581 Secondary hyperparathyroidism of renal origin: Secondary | ICD-10-CM | POA: Diagnosis not present

## 2022-08-10 DIAGNOSIS — E8770 Fluid overload, unspecified: Secondary | ICD-10-CM | POA: Diagnosis not present

## 2022-08-10 DIAGNOSIS — N186 End stage renal disease: Secondary | ICD-10-CM | POA: Diagnosis not present

## 2022-08-10 DIAGNOSIS — Z992 Dependence on renal dialysis: Secondary | ICD-10-CM | POA: Diagnosis not present

## 2022-08-13 DIAGNOSIS — R519 Headache, unspecified: Secondary | ICD-10-CM | POA: Diagnosis not present

## 2022-08-13 DIAGNOSIS — N2581 Secondary hyperparathyroidism of renal origin: Secondary | ICD-10-CM | POA: Diagnosis not present

## 2022-08-13 DIAGNOSIS — T8249XD Other complication of vascular dialysis catheter, subsequent encounter: Secondary | ICD-10-CM | POA: Diagnosis not present

## 2022-08-13 DIAGNOSIS — N186 End stage renal disease: Secondary | ICD-10-CM | POA: Diagnosis not present

## 2022-08-13 DIAGNOSIS — Z992 Dependence on renal dialysis: Secondary | ICD-10-CM | POA: Diagnosis not present

## 2022-08-15 DIAGNOSIS — N2581 Secondary hyperparathyroidism of renal origin: Secondary | ICD-10-CM | POA: Diagnosis not present

## 2022-08-15 DIAGNOSIS — T8249XD Other complication of vascular dialysis catheter, subsequent encounter: Secondary | ICD-10-CM | POA: Diagnosis not present

## 2022-08-15 DIAGNOSIS — N186 End stage renal disease: Secondary | ICD-10-CM | POA: Diagnosis not present

## 2022-08-15 DIAGNOSIS — R519 Headache, unspecified: Secondary | ICD-10-CM | POA: Diagnosis not present

## 2022-08-15 DIAGNOSIS — Z992 Dependence on renal dialysis: Secondary | ICD-10-CM | POA: Diagnosis not present

## 2022-08-17 DIAGNOSIS — R519 Headache, unspecified: Secondary | ICD-10-CM | POA: Diagnosis not present

## 2022-08-17 DIAGNOSIS — T8249XD Other complication of vascular dialysis catheter, subsequent encounter: Secondary | ICD-10-CM | POA: Diagnosis not present

## 2022-08-17 DIAGNOSIS — Z992 Dependence on renal dialysis: Secondary | ICD-10-CM | POA: Diagnosis not present

## 2022-08-17 DIAGNOSIS — N2581 Secondary hyperparathyroidism of renal origin: Secondary | ICD-10-CM | POA: Diagnosis not present

## 2022-08-17 DIAGNOSIS — N186 End stage renal disease: Secondary | ICD-10-CM | POA: Diagnosis not present

## 2022-08-20 DIAGNOSIS — T82858A Stenosis of vascular prosthetic devices, implants and grafts, initial encounter: Secondary | ICD-10-CM | POA: Diagnosis not present

## 2022-08-20 DIAGNOSIS — T8249XD Other complication of vascular dialysis catheter, subsequent encounter: Secondary | ICD-10-CM | POA: Diagnosis not present

## 2022-08-20 DIAGNOSIS — N186 End stage renal disease: Secondary | ICD-10-CM | POA: Diagnosis not present

## 2022-08-20 DIAGNOSIS — I771 Stricture of artery: Secondary | ICD-10-CM | POA: Diagnosis not present

## 2022-08-20 DIAGNOSIS — N2581 Secondary hyperparathyroidism of renal origin: Secondary | ICD-10-CM | POA: Diagnosis not present

## 2022-08-20 DIAGNOSIS — Z992 Dependence on renal dialysis: Secondary | ICD-10-CM | POA: Diagnosis not present

## 2022-08-22 DIAGNOSIS — Z992 Dependence on renal dialysis: Secondary | ICD-10-CM | POA: Diagnosis not present

## 2022-08-22 DIAGNOSIS — N2581 Secondary hyperparathyroidism of renal origin: Secondary | ICD-10-CM | POA: Diagnosis not present

## 2022-08-22 DIAGNOSIS — N186 End stage renal disease: Secondary | ICD-10-CM | POA: Diagnosis not present

## 2022-08-22 DIAGNOSIS — T8249XD Other complication of vascular dialysis catheter, subsequent encounter: Secondary | ICD-10-CM | POA: Diagnosis not present

## 2022-08-24 DIAGNOSIS — N186 End stage renal disease: Secondary | ICD-10-CM | POA: Diagnosis not present

## 2022-08-24 DIAGNOSIS — T8249XD Other complication of vascular dialysis catheter, subsequent encounter: Secondary | ICD-10-CM | POA: Diagnosis not present

## 2022-08-24 DIAGNOSIS — N2581 Secondary hyperparathyroidism of renal origin: Secondary | ICD-10-CM | POA: Diagnosis not present

## 2022-08-24 DIAGNOSIS — Z992 Dependence on renal dialysis: Secondary | ICD-10-CM | POA: Diagnosis not present

## 2022-08-26 DIAGNOSIS — N186 End stage renal disease: Secondary | ICD-10-CM | POA: Diagnosis not present

## 2022-08-26 DIAGNOSIS — N2581 Secondary hyperparathyroidism of renal origin: Secondary | ICD-10-CM | POA: Diagnosis not present

## 2022-08-26 DIAGNOSIS — Z992 Dependence on renal dialysis: Secondary | ICD-10-CM | POA: Diagnosis not present

## 2022-08-26 DIAGNOSIS — T8249XD Other complication of vascular dialysis catheter, subsequent encounter: Secondary | ICD-10-CM | POA: Diagnosis not present

## 2022-08-28 DIAGNOSIS — N186 End stage renal disease: Secondary | ICD-10-CM | POA: Diagnosis not present

## 2022-08-28 DIAGNOSIS — N2581 Secondary hyperparathyroidism of renal origin: Secondary | ICD-10-CM | POA: Diagnosis not present

## 2022-08-28 DIAGNOSIS — T8249XD Other complication of vascular dialysis catheter, subsequent encounter: Secondary | ICD-10-CM | POA: Diagnosis not present

## 2022-08-28 DIAGNOSIS — Z992 Dependence on renal dialysis: Secondary | ICD-10-CM | POA: Diagnosis not present

## 2022-08-31 DIAGNOSIS — T8249XD Other complication of vascular dialysis catheter, subsequent encounter: Secondary | ICD-10-CM | POA: Diagnosis not present

## 2022-08-31 DIAGNOSIS — Z992 Dependence on renal dialysis: Secondary | ICD-10-CM | POA: Diagnosis not present

## 2022-08-31 DIAGNOSIS — N2581 Secondary hyperparathyroidism of renal origin: Secondary | ICD-10-CM | POA: Diagnosis not present

## 2022-08-31 DIAGNOSIS — N186 End stage renal disease: Secondary | ICD-10-CM | POA: Diagnosis not present

## 2022-09-03 DIAGNOSIS — T8249XD Other complication of vascular dialysis catheter, subsequent encounter: Secondary | ICD-10-CM | POA: Diagnosis not present

## 2022-09-03 DIAGNOSIS — N2581 Secondary hyperparathyroidism of renal origin: Secondary | ICD-10-CM | POA: Diagnosis not present

## 2022-09-03 DIAGNOSIS — Z992 Dependence on renal dialysis: Secondary | ICD-10-CM | POA: Diagnosis not present

## 2022-09-03 DIAGNOSIS — N186 End stage renal disease: Secondary | ICD-10-CM | POA: Diagnosis not present

## 2022-09-05 DIAGNOSIS — N186 End stage renal disease: Secondary | ICD-10-CM | POA: Diagnosis not present

## 2022-09-05 DIAGNOSIS — N2581 Secondary hyperparathyroidism of renal origin: Secondary | ICD-10-CM | POA: Diagnosis not present

## 2022-09-05 DIAGNOSIS — Z992 Dependence on renal dialysis: Secondary | ICD-10-CM | POA: Diagnosis not present

## 2022-09-05 DIAGNOSIS — T8249XD Other complication of vascular dialysis catheter, subsequent encounter: Secondary | ICD-10-CM | POA: Diagnosis not present

## 2022-09-05 DIAGNOSIS — E1122 Type 2 diabetes mellitus with diabetic chronic kidney disease: Secondary | ICD-10-CM | POA: Diagnosis not present

## 2022-09-07 DIAGNOSIS — T8249XD Other complication of vascular dialysis catheter, subsequent encounter: Secondary | ICD-10-CM | POA: Diagnosis not present

## 2022-09-07 DIAGNOSIS — Z992 Dependence on renal dialysis: Secondary | ICD-10-CM | POA: Diagnosis not present

## 2022-09-07 DIAGNOSIS — N186 End stage renal disease: Secondary | ICD-10-CM | POA: Diagnosis not present

## 2022-09-07 DIAGNOSIS — N2581 Secondary hyperparathyroidism of renal origin: Secondary | ICD-10-CM | POA: Diagnosis not present

## 2022-09-10 DIAGNOSIS — N2581 Secondary hyperparathyroidism of renal origin: Secondary | ICD-10-CM | POA: Diagnosis not present

## 2022-09-10 DIAGNOSIS — N186 End stage renal disease: Secondary | ICD-10-CM | POA: Diagnosis not present

## 2022-09-10 DIAGNOSIS — T8249XD Other complication of vascular dialysis catheter, subsequent encounter: Secondary | ICD-10-CM | POA: Diagnosis not present

## 2022-09-10 DIAGNOSIS — Z992 Dependence on renal dialysis: Secondary | ICD-10-CM | POA: Diagnosis not present

## 2022-09-12 DIAGNOSIS — T8249XD Other complication of vascular dialysis catheter, subsequent encounter: Secondary | ICD-10-CM | POA: Diagnosis not present

## 2022-09-12 DIAGNOSIS — N2581 Secondary hyperparathyroidism of renal origin: Secondary | ICD-10-CM | POA: Diagnosis not present

## 2022-09-12 DIAGNOSIS — N186 End stage renal disease: Secondary | ICD-10-CM | POA: Diagnosis not present

## 2022-09-12 DIAGNOSIS — Z992 Dependence on renal dialysis: Secondary | ICD-10-CM | POA: Diagnosis not present

## 2022-09-14 DIAGNOSIS — T8249XD Other complication of vascular dialysis catheter, subsequent encounter: Secondary | ICD-10-CM | POA: Diagnosis not present

## 2022-09-14 DIAGNOSIS — N186 End stage renal disease: Secondary | ICD-10-CM | POA: Diagnosis not present

## 2022-09-14 DIAGNOSIS — Z992 Dependence on renal dialysis: Secondary | ICD-10-CM | POA: Diagnosis not present

## 2022-09-14 DIAGNOSIS — N2581 Secondary hyperparathyroidism of renal origin: Secondary | ICD-10-CM | POA: Diagnosis not present

## 2022-09-17 DIAGNOSIS — N186 End stage renal disease: Secondary | ICD-10-CM | POA: Diagnosis not present

## 2022-09-17 DIAGNOSIS — T8249XD Other complication of vascular dialysis catheter, subsequent encounter: Secondary | ICD-10-CM | POA: Diagnosis not present

## 2022-09-17 DIAGNOSIS — N2581 Secondary hyperparathyroidism of renal origin: Secondary | ICD-10-CM | POA: Diagnosis not present

## 2022-09-17 DIAGNOSIS — Z992 Dependence on renal dialysis: Secondary | ICD-10-CM | POA: Diagnosis not present

## 2022-09-19 DIAGNOSIS — T8249XD Other complication of vascular dialysis catheter, subsequent encounter: Secondary | ICD-10-CM | POA: Diagnosis not present

## 2022-09-19 DIAGNOSIS — Z992 Dependence on renal dialysis: Secondary | ICD-10-CM | POA: Diagnosis not present

## 2022-09-19 DIAGNOSIS — N186 End stage renal disease: Secondary | ICD-10-CM | POA: Diagnosis not present

## 2022-09-19 DIAGNOSIS — N2581 Secondary hyperparathyroidism of renal origin: Secondary | ICD-10-CM | POA: Diagnosis not present

## 2022-09-21 DIAGNOSIS — Z992 Dependence on renal dialysis: Secondary | ICD-10-CM | POA: Diagnosis not present

## 2022-09-21 DIAGNOSIS — T8249XD Other complication of vascular dialysis catheter, subsequent encounter: Secondary | ICD-10-CM | POA: Diagnosis not present

## 2022-09-21 DIAGNOSIS — N2581 Secondary hyperparathyroidism of renal origin: Secondary | ICD-10-CM | POA: Diagnosis not present

## 2022-09-21 DIAGNOSIS — N186 End stage renal disease: Secondary | ICD-10-CM | POA: Diagnosis not present

## 2022-09-24 DIAGNOSIS — N2581 Secondary hyperparathyroidism of renal origin: Secondary | ICD-10-CM | POA: Diagnosis not present

## 2022-09-24 DIAGNOSIS — N186 End stage renal disease: Secondary | ICD-10-CM | POA: Diagnosis not present

## 2022-09-24 DIAGNOSIS — T8249XD Other complication of vascular dialysis catheter, subsequent encounter: Secondary | ICD-10-CM | POA: Diagnosis not present

## 2022-09-24 DIAGNOSIS — Z992 Dependence on renal dialysis: Secondary | ICD-10-CM | POA: Diagnosis not present

## 2022-09-26 DIAGNOSIS — T8249XD Other complication of vascular dialysis catheter, subsequent encounter: Secondary | ICD-10-CM | POA: Diagnosis not present

## 2022-09-26 DIAGNOSIS — Z992 Dependence on renal dialysis: Secondary | ICD-10-CM | POA: Diagnosis not present

## 2022-09-26 DIAGNOSIS — N186 End stage renal disease: Secondary | ICD-10-CM | POA: Diagnosis not present

## 2022-09-26 DIAGNOSIS — N2581 Secondary hyperparathyroidism of renal origin: Secondary | ICD-10-CM | POA: Diagnosis not present

## 2022-09-28 DIAGNOSIS — T8249XD Other complication of vascular dialysis catheter, subsequent encounter: Secondary | ICD-10-CM | POA: Diagnosis not present

## 2022-09-28 DIAGNOSIS — N186 End stage renal disease: Secondary | ICD-10-CM | POA: Diagnosis not present

## 2022-09-28 DIAGNOSIS — N2581 Secondary hyperparathyroidism of renal origin: Secondary | ICD-10-CM | POA: Diagnosis not present

## 2022-09-28 DIAGNOSIS — Z992 Dependence on renal dialysis: Secondary | ICD-10-CM | POA: Diagnosis not present

## 2022-10-01 DIAGNOSIS — N2581 Secondary hyperparathyroidism of renal origin: Secondary | ICD-10-CM | POA: Diagnosis not present

## 2022-10-01 DIAGNOSIS — T8249XD Other complication of vascular dialysis catheter, subsequent encounter: Secondary | ICD-10-CM | POA: Diagnosis not present

## 2022-10-01 DIAGNOSIS — N186 End stage renal disease: Secondary | ICD-10-CM | POA: Diagnosis not present

## 2022-10-01 DIAGNOSIS — Z992 Dependence on renal dialysis: Secondary | ICD-10-CM | POA: Diagnosis not present

## 2022-10-03 DIAGNOSIS — N186 End stage renal disease: Secondary | ICD-10-CM | POA: Diagnosis not present

## 2022-10-03 DIAGNOSIS — N2581 Secondary hyperparathyroidism of renal origin: Secondary | ICD-10-CM | POA: Diagnosis not present

## 2022-10-03 DIAGNOSIS — Z992 Dependence on renal dialysis: Secondary | ICD-10-CM | POA: Diagnosis not present

## 2022-10-03 DIAGNOSIS — T8249XD Other complication of vascular dialysis catheter, subsequent encounter: Secondary | ICD-10-CM | POA: Diagnosis not present

## 2022-10-04 DIAGNOSIS — Z452 Encounter for adjustment and management of vascular access device: Secondary | ICD-10-CM | POA: Diagnosis not present

## 2022-10-04 DIAGNOSIS — N186 End stage renal disease: Secondary | ICD-10-CM | POA: Diagnosis not present

## 2022-10-04 DIAGNOSIS — Z992 Dependence on renal dialysis: Secondary | ICD-10-CM | POA: Diagnosis not present

## 2022-10-05 DIAGNOSIS — N2581 Secondary hyperparathyroidism of renal origin: Secondary | ICD-10-CM | POA: Diagnosis not present

## 2022-10-05 DIAGNOSIS — T8249XD Other complication of vascular dialysis catheter, subsequent encounter: Secondary | ICD-10-CM | POA: Diagnosis not present

## 2022-10-05 DIAGNOSIS — N186 End stage renal disease: Secondary | ICD-10-CM | POA: Diagnosis not present

## 2022-10-05 DIAGNOSIS — Z992 Dependence on renal dialysis: Secondary | ICD-10-CM | POA: Diagnosis not present

## 2022-10-06 DIAGNOSIS — N186 End stage renal disease: Secondary | ICD-10-CM | POA: Diagnosis not present

## 2022-10-06 DIAGNOSIS — E1122 Type 2 diabetes mellitus with diabetic chronic kidney disease: Secondary | ICD-10-CM | POA: Diagnosis not present

## 2022-10-06 DIAGNOSIS — Z992 Dependence on renal dialysis: Secondary | ICD-10-CM | POA: Diagnosis not present

## 2022-10-08 DIAGNOSIS — N186 End stage renal disease: Secondary | ICD-10-CM | POA: Diagnosis not present

## 2022-10-08 DIAGNOSIS — Z992 Dependence on renal dialysis: Secondary | ICD-10-CM | POA: Diagnosis not present

## 2022-10-08 DIAGNOSIS — N2581 Secondary hyperparathyroidism of renal origin: Secondary | ICD-10-CM | POA: Diagnosis not present

## 2022-10-10 DIAGNOSIS — N186 End stage renal disease: Secondary | ICD-10-CM | POA: Diagnosis not present

## 2022-10-10 DIAGNOSIS — Z992 Dependence on renal dialysis: Secondary | ICD-10-CM | POA: Diagnosis not present

## 2022-10-10 DIAGNOSIS — N2581 Secondary hyperparathyroidism of renal origin: Secondary | ICD-10-CM | POA: Diagnosis not present

## 2022-10-12 DIAGNOSIS — N186 End stage renal disease: Secondary | ICD-10-CM | POA: Diagnosis not present

## 2022-10-12 DIAGNOSIS — N2581 Secondary hyperparathyroidism of renal origin: Secondary | ICD-10-CM | POA: Diagnosis not present

## 2022-10-12 DIAGNOSIS — Z992 Dependence on renal dialysis: Secondary | ICD-10-CM | POA: Diagnosis not present

## 2022-10-15 DIAGNOSIS — N2581 Secondary hyperparathyroidism of renal origin: Secondary | ICD-10-CM | POA: Diagnosis not present

## 2022-10-15 DIAGNOSIS — N186 End stage renal disease: Secondary | ICD-10-CM | POA: Diagnosis not present

## 2022-10-15 DIAGNOSIS — Z992 Dependence on renal dialysis: Secondary | ICD-10-CM | POA: Diagnosis not present

## 2022-10-17 DIAGNOSIS — Z992 Dependence on renal dialysis: Secondary | ICD-10-CM | POA: Diagnosis not present

## 2022-10-17 DIAGNOSIS — N2581 Secondary hyperparathyroidism of renal origin: Secondary | ICD-10-CM | POA: Diagnosis not present

## 2022-10-17 DIAGNOSIS — N186 End stage renal disease: Secondary | ICD-10-CM | POA: Diagnosis not present

## 2022-10-19 DIAGNOSIS — N2581 Secondary hyperparathyroidism of renal origin: Secondary | ICD-10-CM | POA: Diagnosis not present

## 2022-10-19 DIAGNOSIS — N186 End stage renal disease: Secondary | ICD-10-CM | POA: Diagnosis not present

## 2022-10-19 DIAGNOSIS — Z992 Dependence on renal dialysis: Secondary | ICD-10-CM | POA: Diagnosis not present

## 2022-10-22 DIAGNOSIS — Z992 Dependence on renal dialysis: Secondary | ICD-10-CM | POA: Diagnosis not present

## 2022-10-22 DIAGNOSIS — N186 End stage renal disease: Secondary | ICD-10-CM | POA: Diagnosis not present

## 2022-10-22 DIAGNOSIS — N2581 Secondary hyperparathyroidism of renal origin: Secondary | ICD-10-CM | POA: Diagnosis not present

## 2022-10-24 DIAGNOSIS — N186 End stage renal disease: Secondary | ICD-10-CM | POA: Diagnosis not present

## 2022-10-24 DIAGNOSIS — N2581 Secondary hyperparathyroidism of renal origin: Secondary | ICD-10-CM | POA: Diagnosis not present

## 2022-10-24 DIAGNOSIS — Z992 Dependence on renal dialysis: Secondary | ICD-10-CM | POA: Diagnosis not present

## 2022-10-26 DIAGNOSIS — Z992 Dependence on renal dialysis: Secondary | ICD-10-CM | POA: Diagnosis not present

## 2022-10-26 DIAGNOSIS — N2581 Secondary hyperparathyroidism of renal origin: Secondary | ICD-10-CM | POA: Diagnosis not present

## 2022-10-26 DIAGNOSIS — N186 End stage renal disease: Secondary | ICD-10-CM | POA: Diagnosis not present

## 2022-10-29 DIAGNOSIS — N2581 Secondary hyperparathyroidism of renal origin: Secondary | ICD-10-CM | POA: Diagnosis not present

## 2022-10-29 DIAGNOSIS — N186 End stage renal disease: Secondary | ICD-10-CM | POA: Diagnosis not present

## 2022-10-29 DIAGNOSIS — Z992 Dependence on renal dialysis: Secondary | ICD-10-CM | POA: Diagnosis not present

## 2022-10-31 DIAGNOSIS — N186 End stage renal disease: Secondary | ICD-10-CM | POA: Diagnosis not present

## 2022-10-31 DIAGNOSIS — Z992 Dependence on renal dialysis: Secondary | ICD-10-CM | POA: Diagnosis not present

## 2022-10-31 DIAGNOSIS — N2581 Secondary hyperparathyroidism of renal origin: Secondary | ICD-10-CM | POA: Diagnosis not present

## 2022-11-02 DIAGNOSIS — N2581 Secondary hyperparathyroidism of renal origin: Secondary | ICD-10-CM | POA: Diagnosis not present

## 2022-11-02 DIAGNOSIS — Z992 Dependence on renal dialysis: Secondary | ICD-10-CM | POA: Diagnosis not present

## 2022-11-02 DIAGNOSIS — N186 End stage renal disease: Secondary | ICD-10-CM | POA: Diagnosis not present

## 2022-11-05 DIAGNOSIS — N186 End stage renal disease: Secondary | ICD-10-CM | POA: Diagnosis not present

## 2022-11-05 DIAGNOSIS — N2581 Secondary hyperparathyroidism of renal origin: Secondary | ICD-10-CM | POA: Diagnosis not present

## 2022-11-05 DIAGNOSIS — Z992 Dependence on renal dialysis: Secondary | ICD-10-CM | POA: Diagnosis not present

## 2022-11-06 DIAGNOSIS — N186 End stage renal disease: Secondary | ICD-10-CM | POA: Diagnosis not present

## 2022-11-06 DIAGNOSIS — Z992 Dependence on renal dialysis: Secondary | ICD-10-CM | POA: Diagnosis not present

## 2022-11-06 DIAGNOSIS — E1122 Type 2 diabetes mellitus with diabetic chronic kidney disease: Secondary | ICD-10-CM | POA: Diagnosis not present

## 2022-11-07 DIAGNOSIS — N2581 Secondary hyperparathyroidism of renal origin: Secondary | ICD-10-CM | POA: Diagnosis not present

## 2022-11-07 DIAGNOSIS — N186 End stage renal disease: Secondary | ICD-10-CM | POA: Diagnosis not present

## 2022-11-07 DIAGNOSIS — Z992 Dependence on renal dialysis: Secondary | ICD-10-CM | POA: Diagnosis not present

## 2022-11-09 DIAGNOSIS — N186 End stage renal disease: Secondary | ICD-10-CM | POA: Diagnosis not present

## 2022-11-09 DIAGNOSIS — Z992 Dependence on renal dialysis: Secondary | ICD-10-CM | POA: Diagnosis not present

## 2022-11-09 DIAGNOSIS — N2581 Secondary hyperparathyroidism of renal origin: Secondary | ICD-10-CM | POA: Diagnosis not present

## 2022-11-12 DIAGNOSIS — Z992 Dependence on renal dialysis: Secondary | ICD-10-CM | POA: Diagnosis not present

## 2022-11-12 DIAGNOSIS — N2581 Secondary hyperparathyroidism of renal origin: Secondary | ICD-10-CM | POA: Diagnosis not present

## 2022-11-12 DIAGNOSIS — N186 End stage renal disease: Secondary | ICD-10-CM | POA: Diagnosis not present

## 2022-11-14 DIAGNOSIS — N186 End stage renal disease: Secondary | ICD-10-CM | POA: Diagnosis not present

## 2022-11-14 DIAGNOSIS — N2581 Secondary hyperparathyroidism of renal origin: Secondary | ICD-10-CM | POA: Diagnosis not present

## 2022-11-14 DIAGNOSIS — Z992 Dependence on renal dialysis: Secondary | ICD-10-CM | POA: Diagnosis not present

## 2022-11-16 DIAGNOSIS — N186 End stage renal disease: Secondary | ICD-10-CM | POA: Diagnosis not present

## 2022-11-16 DIAGNOSIS — Z992 Dependence on renal dialysis: Secondary | ICD-10-CM | POA: Diagnosis not present

## 2022-11-16 DIAGNOSIS — N2581 Secondary hyperparathyroidism of renal origin: Secondary | ICD-10-CM | POA: Diagnosis not present

## 2022-11-19 DIAGNOSIS — Z992 Dependence on renal dialysis: Secondary | ICD-10-CM | POA: Diagnosis not present

## 2022-11-19 DIAGNOSIS — N2581 Secondary hyperparathyroidism of renal origin: Secondary | ICD-10-CM | POA: Diagnosis not present

## 2022-11-19 DIAGNOSIS — N186 End stage renal disease: Secondary | ICD-10-CM | POA: Diagnosis not present

## 2022-11-20 DIAGNOSIS — E1165 Type 2 diabetes mellitus with hyperglycemia: Secondary | ICD-10-CM | POA: Diagnosis not present

## 2022-11-20 DIAGNOSIS — E782 Mixed hyperlipidemia: Secondary | ICD-10-CM | POA: Diagnosis not present

## 2022-11-20 DIAGNOSIS — Z6831 Body mass index (BMI) 31.0-31.9, adult: Secondary | ICD-10-CM | POA: Diagnosis not present

## 2022-11-20 DIAGNOSIS — I480 Paroxysmal atrial fibrillation: Secondary | ICD-10-CM | POA: Diagnosis not present

## 2022-11-21 DIAGNOSIS — Z992 Dependence on renal dialysis: Secondary | ICD-10-CM | POA: Diagnosis not present

## 2022-11-21 DIAGNOSIS — N2581 Secondary hyperparathyroidism of renal origin: Secondary | ICD-10-CM | POA: Diagnosis not present

## 2022-11-21 DIAGNOSIS — N186 End stage renal disease: Secondary | ICD-10-CM | POA: Diagnosis not present

## 2022-11-23 DIAGNOSIS — N2581 Secondary hyperparathyroidism of renal origin: Secondary | ICD-10-CM | POA: Diagnosis not present

## 2022-11-23 DIAGNOSIS — Z992 Dependence on renal dialysis: Secondary | ICD-10-CM | POA: Diagnosis not present

## 2022-11-23 DIAGNOSIS — N186 End stage renal disease: Secondary | ICD-10-CM | POA: Diagnosis not present

## 2022-11-26 DIAGNOSIS — Z992 Dependence on renal dialysis: Secondary | ICD-10-CM | POA: Diagnosis not present

## 2022-11-26 DIAGNOSIS — N186 End stage renal disease: Secondary | ICD-10-CM | POA: Diagnosis not present

## 2022-11-26 DIAGNOSIS — N2581 Secondary hyperparathyroidism of renal origin: Secondary | ICD-10-CM | POA: Diagnosis not present

## 2022-11-28 DIAGNOSIS — N186 End stage renal disease: Secondary | ICD-10-CM | POA: Diagnosis not present

## 2022-11-28 DIAGNOSIS — Z992 Dependence on renal dialysis: Secondary | ICD-10-CM | POA: Diagnosis not present

## 2022-11-28 DIAGNOSIS — N2581 Secondary hyperparathyroidism of renal origin: Secondary | ICD-10-CM | POA: Diagnosis not present

## 2022-11-30 DIAGNOSIS — Z992 Dependence on renal dialysis: Secondary | ICD-10-CM | POA: Diagnosis not present

## 2022-11-30 DIAGNOSIS — N186 End stage renal disease: Secondary | ICD-10-CM | POA: Diagnosis not present

## 2022-11-30 DIAGNOSIS — N2581 Secondary hyperparathyroidism of renal origin: Secondary | ICD-10-CM | POA: Diagnosis not present

## 2022-12-03 DIAGNOSIS — N186 End stage renal disease: Secondary | ICD-10-CM | POA: Diagnosis not present

## 2022-12-03 DIAGNOSIS — N2581 Secondary hyperparathyroidism of renal origin: Secondary | ICD-10-CM | POA: Diagnosis not present

## 2022-12-03 DIAGNOSIS — Z992 Dependence on renal dialysis: Secondary | ICD-10-CM | POA: Diagnosis not present

## 2022-12-05 DIAGNOSIS — Z992 Dependence on renal dialysis: Secondary | ICD-10-CM | POA: Diagnosis not present

## 2022-12-05 DIAGNOSIS — E1122 Type 2 diabetes mellitus with diabetic chronic kidney disease: Secondary | ICD-10-CM | POA: Diagnosis not present

## 2022-12-05 DIAGNOSIS — N2581 Secondary hyperparathyroidism of renal origin: Secondary | ICD-10-CM | POA: Diagnosis not present

## 2022-12-05 DIAGNOSIS — N186 End stage renal disease: Secondary | ICD-10-CM | POA: Diagnosis not present

## 2022-12-07 DIAGNOSIS — Z992 Dependence on renal dialysis: Secondary | ICD-10-CM | POA: Diagnosis not present

## 2022-12-07 DIAGNOSIS — N186 End stage renal disease: Secondary | ICD-10-CM | POA: Diagnosis not present

## 2022-12-07 DIAGNOSIS — N2581 Secondary hyperparathyroidism of renal origin: Secondary | ICD-10-CM | POA: Diagnosis not present

## 2022-12-10 DIAGNOSIS — N186 End stage renal disease: Secondary | ICD-10-CM | POA: Diagnosis not present

## 2022-12-10 DIAGNOSIS — Z992 Dependence on renal dialysis: Secondary | ICD-10-CM | POA: Diagnosis not present

## 2022-12-10 DIAGNOSIS — N2581 Secondary hyperparathyroidism of renal origin: Secondary | ICD-10-CM | POA: Diagnosis not present

## 2022-12-12 DIAGNOSIS — N186 End stage renal disease: Secondary | ICD-10-CM | POA: Diagnosis not present

## 2022-12-12 DIAGNOSIS — N2581 Secondary hyperparathyroidism of renal origin: Secondary | ICD-10-CM | POA: Diagnosis not present

## 2022-12-12 DIAGNOSIS — Z992 Dependence on renal dialysis: Secondary | ICD-10-CM | POA: Diagnosis not present

## 2022-12-14 DIAGNOSIS — N2581 Secondary hyperparathyroidism of renal origin: Secondary | ICD-10-CM | POA: Diagnosis not present

## 2022-12-14 DIAGNOSIS — Z992 Dependence on renal dialysis: Secondary | ICD-10-CM | POA: Diagnosis not present

## 2022-12-14 DIAGNOSIS — N186 End stage renal disease: Secondary | ICD-10-CM | POA: Diagnosis not present

## 2022-12-17 DIAGNOSIS — Z992 Dependence on renal dialysis: Secondary | ICD-10-CM | POA: Diagnosis not present

## 2022-12-17 DIAGNOSIS — N186 End stage renal disease: Secondary | ICD-10-CM | POA: Diagnosis not present

## 2022-12-17 DIAGNOSIS — N2581 Secondary hyperparathyroidism of renal origin: Secondary | ICD-10-CM | POA: Diagnosis not present

## 2022-12-17 DIAGNOSIS — Z23 Encounter for immunization: Secondary | ICD-10-CM | POA: Diagnosis not present

## 2022-12-18 DIAGNOSIS — I771 Stricture of artery: Secondary | ICD-10-CM | POA: Diagnosis not present

## 2022-12-18 DIAGNOSIS — Z992 Dependence on renal dialysis: Secondary | ICD-10-CM | POA: Diagnosis not present

## 2022-12-18 DIAGNOSIS — N186 End stage renal disease: Secondary | ICD-10-CM | POA: Diagnosis not present

## 2022-12-18 DIAGNOSIS — I871 Compression of vein: Secondary | ICD-10-CM | POA: Diagnosis not present

## 2022-12-19 DIAGNOSIS — N2581 Secondary hyperparathyroidism of renal origin: Secondary | ICD-10-CM | POA: Diagnosis not present

## 2022-12-19 DIAGNOSIS — Z23 Encounter for immunization: Secondary | ICD-10-CM | POA: Diagnosis not present

## 2022-12-19 DIAGNOSIS — N186 End stage renal disease: Secondary | ICD-10-CM | POA: Diagnosis not present

## 2022-12-19 DIAGNOSIS — Z992 Dependence on renal dialysis: Secondary | ICD-10-CM | POA: Diagnosis not present

## 2022-12-21 DIAGNOSIS — N2581 Secondary hyperparathyroidism of renal origin: Secondary | ICD-10-CM | POA: Diagnosis not present

## 2022-12-21 DIAGNOSIS — N186 End stage renal disease: Secondary | ICD-10-CM | POA: Diagnosis not present

## 2022-12-21 DIAGNOSIS — Z992 Dependence on renal dialysis: Secondary | ICD-10-CM | POA: Diagnosis not present

## 2022-12-21 DIAGNOSIS — Z23 Encounter for immunization: Secondary | ICD-10-CM | POA: Diagnosis not present

## 2022-12-24 DIAGNOSIS — N2581 Secondary hyperparathyroidism of renal origin: Secondary | ICD-10-CM | POA: Diagnosis not present

## 2022-12-24 DIAGNOSIS — N186 End stage renal disease: Secondary | ICD-10-CM | POA: Diagnosis not present

## 2022-12-24 DIAGNOSIS — Z992 Dependence on renal dialysis: Secondary | ICD-10-CM | POA: Diagnosis not present

## 2022-12-26 DIAGNOSIS — Z992 Dependence on renal dialysis: Secondary | ICD-10-CM | POA: Diagnosis not present

## 2022-12-26 DIAGNOSIS — N186 End stage renal disease: Secondary | ICD-10-CM | POA: Diagnosis not present

## 2022-12-26 DIAGNOSIS — N2581 Secondary hyperparathyroidism of renal origin: Secondary | ICD-10-CM | POA: Diagnosis not present

## 2022-12-27 DIAGNOSIS — E1121 Type 2 diabetes mellitus with diabetic nephropathy: Secondary | ICD-10-CM | POA: Diagnosis not present

## 2022-12-27 DIAGNOSIS — Z114 Encounter for screening for human immunodeficiency virus [HIV]: Secondary | ICD-10-CM | POA: Diagnosis not present

## 2022-12-27 DIAGNOSIS — Z7682 Awaiting organ transplant status: Secondary | ICD-10-CM | POA: Diagnosis not present

## 2022-12-27 DIAGNOSIS — Z111 Encounter for screening for respiratory tuberculosis: Secondary | ICD-10-CM | POA: Diagnosis not present

## 2022-12-27 DIAGNOSIS — Z1159 Encounter for screening for other viral diseases: Secondary | ICD-10-CM | POA: Diagnosis not present

## 2022-12-27 DIAGNOSIS — Z125 Encounter for screening for malignant neoplasm of prostate: Secondary | ICD-10-CM | POA: Diagnosis not present

## 2022-12-27 DIAGNOSIS — Z01818 Encounter for other preprocedural examination: Secondary | ICD-10-CM | POA: Diagnosis not present

## 2022-12-28 DIAGNOSIS — N186 End stage renal disease: Secondary | ICD-10-CM | POA: Diagnosis not present

## 2022-12-28 DIAGNOSIS — N2581 Secondary hyperparathyroidism of renal origin: Secondary | ICD-10-CM | POA: Diagnosis not present

## 2022-12-28 DIAGNOSIS — Z992 Dependence on renal dialysis: Secondary | ICD-10-CM | POA: Diagnosis not present

## 2022-12-31 DIAGNOSIS — N2581 Secondary hyperparathyroidism of renal origin: Secondary | ICD-10-CM | POA: Diagnosis not present

## 2022-12-31 DIAGNOSIS — N186 End stage renal disease: Secondary | ICD-10-CM | POA: Diagnosis not present

## 2022-12-31 DIAGNOSIS — Z992 Dependence on renal dialysis: Secondary | ICD-10-CM | POA: Diagnosis not present

## 2023-01-02 DIAGNOSIS — Z992 Dependence on renal dialysis: Secondary | ICD-10-CM | POA: Diagnosis not present

## 2023-01-02 DIAGNOSIS — N2581 Secondary hyperparathyroidism of renal origin: Secondary | ICD-10-CM | POA: Diagnosis not present

## 2023-01-02 DIAGNOSIS — N186 End stage renal disease: Secondary | ICD-10-CM | POA: Diagnosis not present

## 2023-01-04 DIAGNOSIS — N186 End stage renal disease: Secondary | ICD-10-CM | POA: Diagnosis not present

## 2023-01-04 DIAGNOSIS — Z992 Dependence on renal dialysis: Secondary | ICD-10-CM | POA: Diagnosis not present

## 2023-01-04 DIAGNOSIS — N2581 Secondary hyperparathyroidism of renal origin: Secondary | ICD-10-CM | POA: Diagnosis not present

## 2023-01-05 DIAGNOSIS — N186 End stage renal disease: Secondary | ICD-10-CM | POA: Diagnosis not present

## 2023-01-05 DIAGNOSIS — Z992 Dependence on renal dialysis: Secondary | ICD-10-CM | POA: Diagnosis not present

## 2023-01-05 DIAGNOSIS — E1122 Type 2 diabetes mellitus with diabetic chronic kidney disease: Secondary | ICD-10-CM | POA: Diagnosis not present

## 2023-01-07 DIAGNOSIS — Z992 Dependence on renal dialysis: Secondary | ICD-10-CM | POA: Diagnosis not present

## 2023-01-07 DIAGNOSIS — N186 End stage renal disease: Secondary | ICD-10-CM | POA: Diagnosis not present

## 2023-01-07 DIAGNOSIS — N2581 Secondary hyperparathyroidism of renal origin: Secondary | ICD-10-CM | POA: Diagnosis not present

## 2023-01-09 DIAGNOSIS — N186 End stage renal disease: Secondary | ICD-10-CM | POA: Diagnosis not present

## 2023-01-09 DIAGNOSIS — N2581 Secondary hyperparathyroidism of renal origin: Secondary | ICD-10-CM | POA: Diagnosis not present

## 2023-01-09 DIAGNOSIS — Z992 Dependence on renal dialysis: Secondary | ICD-10-CM | POA: Diagnosis not present

## 2023-01-11 DIAGNOSIS — N186 End stage renal disease: Secondary | ICD-10-CM | POA: Diagnosis not present

## 2023-01-11 DIAGNOSIS — Z992 Dependence on renal dialysis: Secondary | ICD-10-CM | POA: Diagnosis not present

## 2023-01-11 DIAGNOSIS — N2581 Secondary hyperparathyroidism of renal origin: Secondary | ICD-10-CM | POA: Diagnosis not present

## 2023-01-14 DIAGNOSIS — N186 End stage renal disease: Secondary | ICD-10-CM | POA: Diagnosis not present

## 2023-01-14 DIAGNOSIS — Z992 Dependence on renal dialysis: Secondary | ICD-10-CM | POA: Diagnosis not present

## 2023-01-14 DIAGNOSIS — Z23 Encounter for immunization: Secondary | ICD-10-CM | POA: Diagnosis not present

## 2023-01-14 DIAGNOSIS — N2581 Secondary hyperparathyroidism of renal origin: Secondary | ICD-10-CM | POA: Diagnosis not present

## 2023-01-16 DIAGNOSIS — N186 End stage renal disease: Secondary | ICD-10-CM | POA: Diagnosis not present

## 2023-01-16 DIAGNOSIS — Z992 Dependence on renal dialysis: Secondary | ICD-10-CM | POA: Diagnosis not present

## 2023-01-16 DIAGNOSIS — N2581 Secondary hyperparathyroidism of renal origin: Secondary | ICD-10-CM | POA: Diagnosis not present

## 2023-01-16 DIAGNOSIS — Z23 Encounter for immunization: Secondary | ICD-10-CM | POA: Diagnosis not present

## 2023-01-18 DIAGNOSIS — Z23 Encounter for immunization: Secondary | ICD-10-CM | POA: Diagnosis not present

## 2023-01-18 DIAGNOSIS — N186 End stage renal disease: Secondary | ICD-10-CM | POA: Diagnosis not present

## 2023-01-18 DIAGNOSIS — N2581 Secondary hyperparathyroidism of renal origin: Secondary | ICD-10-CM | POA: Diagnosis not present

## 2023-01-18 DIAGNOSIS — Z992 Dependence on renal dialysis: Secondary | ICD-10-CM | POA: Diagnosis not present

## 2023-01-21 DIAGNOSIS — Z992 Dependence on renal dialysis: Secondary | ICD-10-CM | POA: Diagnosis not present

## 2023-01-21 DIAGNOSIS — N186 End stage renal disease: Secondary | ICD-10-CM | POA: Diagnosis not present

## 2023-01-21 DIAGNOSIS — N2581 Secondary hyperparathyroidism of renal origin: Secondary | ICD-10-CM | POA: Diagnosis not present

## 2023-01-23 DIAGNOSIS — N2581 Secondary hyperparathyroidism of renal origin: Secondary | ICD-10-CM | POA: Diagnosis not present

## 2023-01-23 DIAGNOSIS — N186 End stage renal disease: Secondary | ICD-10-CM | POA: Diagnosis not present

## 2023-01-23 DIAGNOSIS — Z992 Dependence on renal dialysis: Secondary | ICD-10-CM | POA: Diagnosis not present

## 2023-01-25 DIAGNOSIS — N186 End stage renal disease: Secondary | ICD-10-CM | POA: Diagnosis not present

## 2023-01-25 DIAGNOSIS — Z992 Dependence on renal dialysis: Secondary | ICD-10-CM | POA: Diagnosis not present

## 2023-01-25 DIAGNOSIS — N2581 Secondary hyperparathyroidism of renal origin: Secondary | ICD-10-CM | POA: Diagnosis not present

## 2023-01-28 DIAGNOSIS — Z992 Dependence on renal dialysis: Secondary | ICD-10-CM | POA: Diagnosis not present

## 2023-01-28 DIAGNOSIS — N186 End stage renal disease: Secondary | ICD-10-CM | POA: Diagnosis not present

## 2023-01-28 DIAGNOSIS — N2581 Secondary hyperparathyroidism of renal origin: Secondary | ICD-10-CM | POA: Diagnosis not present

## 2023-01-30 DIAGNOSIS — Z992 Dependence on renal dialysis: Secondary | ICD-10-CM | POA: Diagnosis not present

## 2023-01-30 DIAGNOSIS — N186 End stage renal disease: Secondary | ICD-10-CM | POA: Diagnosis not present

## 2023-01-30 DIAGNOSIS — N2581 Secondary hyperparathyroidism of renal origin: Secondary | ICD-10-CM | POA: Diagnosis not present

## 2023-02-01 DIAGNOSIS — N186 End stage renal disease: Secondary | ICD-10-CM | POA: Diagnosis not present

## 2023-02-01 DIAGNOSIS — Z992 Dependence on renal dialysis: Secondary | ICD-10-CM | POA: Diagnosis not present

## 2023-02-01 DIAGNOSIS — N2581 Secondary hyperparathyroidism of renal origin: Secondary | ICD-10-CM | POA: Diagnosis not present

## 2023-02-04 DIAGNOSIS — N2581 Secondary hyperparathyroidism of renal origin: Secondary | ICD-10-CM | POA: Diagnosis not present

## 2023-02-04 DIAGNOSIS — N186 End stage renal disease: Secondary | ICD-10-CM | POA: Diagnosis not present

## 2023-02-04 DIAGNOSIS — E1122 Type 2 diabetes mellitus with diabetic chronic kidney disease: Secondary | ICD-10-CM | POA: Diagnosis not present

## 2023-02-04 DIAGNOSIS — Z992 Dependence on renal dialysis: Secondary | ICD-10-CM | POA: Diagnosis not present

## 2023-02-06 DIAGNOSIS — N186 End stage renal disease: Secondary | ICD-10-CM | POA: Diagnosis not present

## 2023-02-06 DIAGNOSIS — N2581 Secondary hyperparathyroidism of renal origin: Secondary | ICD-10-CM | POA: Diagnosis not present

## 2023-02-06 DIAGNOSIS — Z992 Dependence on renal dialysis: Secondary | ICD-10-CM | POA: Diagnosis not present

## 2023-02-08 DIAGNOSIS — Z992 Dependence on renal dialysis: Secondary | ICD-10-CM | POA: Diagnosis not present

## 2023-02-08 DIAGNOSIS — N2581 Secondary hyperparathyroidism of renal origin: Secondary | ICD-10-CM | POA: Diagnosis not present

## 2023-02-08 DIAGNOSIS — N186 End stage renal disease: Secondary | ICD-10-CM | POA: Diagnosis not present

## 2023-02-11 DIAGNOSIS — N2581 Secondary hyperparathyroidism of renal origin: Secondary | ICD-10-CM | POA: Diagnosis not present

## 2023-02-11 DIAGNOSIS — N186 End stage renal disease: Secondary | ICD-10-CM | POA: Diagnosis not present

## 2023-02-11 DIAGNOSIS — Z992 Dependence on renal dialysis: Secondary | ICD-10-CM | POA: Diagnosis not present

## 2023-02-13 DIAGNOSIS — N2581 Secondary hyperparathyroidism of renal origin: Secondary | ICD-10-CM | POA: Diagnosis not present

## 2023-02-13 DIAGNOSIS — Z992 Dependence on renal dialysis: Secondary | ICD-10-CM | POA: Diagnosis not present

## 2023-02-13 DIAGNOSIS — N186 End stage renal disease: Secondary | ICD-10-CM | POA: Diagnosis not present

## 2023-02-15 DIAGNOSIS — Z992 Dependence on renal dialysis: Secondary | ICD-10-CM | POA: Diagnosis not present

## 2023-02-15 DIAGNOSIS — N186 End stage renal disease: Secondary | ICD-10-CM | POA: Diagnosis not present

## 2023-02-15 DIAGNOSIS — N2581 Secondary hyperparathyroidism of renal origin: Secondary | ICD-10-CM | POA: Diagnosis not present

## 2023-02-18 DIAGNOSIS — Z992 Dependence on renal dialysis: Secondary | ICD-10-CM | POA: Diagnosis not present

## 2023-02-18 DIAGNOSIS — Z23 Encounter for immunization: Secondary | ICD-10-CM | POA: Diagnosis not present

## 2023-02-18 DIAGNOSIS — N186 End stage renal disease: Secondary | ICD-10-CM | POA: Diagnosis not present

## 2023-02-18 DIAGNOSIS — N2581 Secondary hyperparathyroidism of renal origin: Secondary | ICD-10-CM | POA: Diagnosis not present

## 2023-02-20 DIAGNOSIS — N186 End stage renal disease: Secondary | ICD-10-CM | POA: Diagnosis not present

## 2023-02-20 DIAGNOSIS — Z992 Dependence on renal dialysis: Secondary | ICD-10-CM | POA: Diagnosis not present

## 2023-02-20 DIAGNOSIS — N2581 Secondary hyperparathyroidism of renal origin: Secondary | ICD-10-CM | POA: Diagnosis not present

## 2023-02-20 DIAGNOSIS — Z23 Encounter for immunization: Secondary | ICD-10-CM | POA: Diagnosis not present

## 2023-02-22 DIAGNOSIS — N186 End stage renal disease: Secondary | ICD-10-CM | POA: Diagnosis not present

## 2023-02-22 DIAGNOSIS — Z23 Encounter for immunization: Secondary | ICD-10-CM | POA: Diagnosis not present

## 2023-02-22 DIAGNOSIS — N2581 Secondary hyperparathyroidism of renal origin: Secondary | ICD-10-CM | POA: Diagnosis not present

## 2023-02-22 DIAGNOSIS — Z992 Dependence on renal dialysis: Secondary | ICD-10-CM | POA: Diagnosis not present

## 2023-02-25 DIAGNOSIS — N186 End stage renal disease: Secondary | ICD-10-CM | POA: Diagnosis not present

## 2023-02-25 DIAGNOSIS — Z992 Dependence on renal dialysis: Secondary | ICD-10-CM | POA: Diagnosis not present

## 2023-02-25 DIAGNOSIS — N2581 Secondary hyperparathyroidism of renal origin: Secondary | ICD-10-CM | POA: Diagnosis not present

## 2023-02-27 DIAGNOSIS — Z992 Dependence on renal dialysis: Secondary | ICD-10-CM | POA: Diagnosis not present

## 2023-02-27 DIAGNOSIS — N2581 Secondary hyperparathyroidism of renal origin: Secondary | ICD-10-CM | POA: Diagnosis not present

## 2023-02-27 DIAGNOSIS — N186 End stage renal disease: Secondary | ICD-10-CM | POA: Diagnosis not present

## 2023-03-01 DIAGNOSIS — N2581 Secondary hyperparathyroidism of renal origin: Secondary | ICD-10-CM | POA: Diagnosis not present

## 2023-03-01 DIAGNOSIS — N186 End stage renal disease: Secondary | ICD-10-CM | POA: Diagnosis not present

## 2023-03-01 DIAGNOSIS — Z992 Dependence on renal dialysis: Secondary | ICD-10-CM | POA: Diagnosis not present

## 2023-03-04 DIAGNOSIS — Z992 Dependence on renal dialysis: Secondary | ICD-10-CM | POA: Diagnosis not present

## 2023-03-04 DIAGNOSIS — N186 End stage renal disease: Secondary | ICD-10-CM | POA: Diagnosis not present

## 2023-03-04 DIAGNOSIS — N2581 Secondary hyperparathyroidism of renal origin: Secondary | ICD-10-CM | POA: Diagnosis not present

## 2023-03-06 DIAGNOSIS — N186 End stage renal disease: Secondary | ICD-10-CM | POA: Diagnosis not present

## 2023-03-06 DIAGNOSIS — Z992 Dependence on renal dialysis: Secondary | ICD-10-CM | POA: Diagnosis not present

## 2023-03-06 DIAGNOSIS — N2581 Secondary hyperparathyroidism of renal origin: Secondary | ICD-10-CM | POA: Diagnosis not present

## 2023-03-07 DIAGNOSIS — Z992 Dependence on renal dialysis: Secondary | ICD-10-CM | POA: Diagnosis not present

## 2023-03-07 DIAGNOSIS — E1122 Type 2 diabetes mellitus with diabetic chronic kidney disease: Secondary | ICD-10-CM | POA: Diagnosis not present

## 2023-03-07 DIAGNOSIS — N186 End stage renal disease: Secondary | ICD-10-CM | POA: Diagnosis not present

## 2023-03-08 DIAGNOSIS — Z992 Dependence on renal dialysis: Secondary | ICD-10-CM | POA: Diagnosis not present

## 2023-03-08 DIAGNOSIS — N186 End stage renal disease: Secondary | ICD-10-CM | POA: Diagnosis not present

## 2023-03-08 DIAGNOSIS — N2581 Secondary hyperparathyroidism of renal origin: Secondary | ICD-10-CM | POA: Diagnosis not present

## 2023-03-11 DIAGNOSIS — N186 End stage renal disease: Secondary | ICD-10-CM | POA: Diagnosis not present

## 2023-03-11 DIAGNOSIS — N2581 Secondary hyperparathyroidism of renal origin: Secondary | ICD-10-CM | POA: Diagnosis not present

## 2023-03-11 DIAGNOSIS — Z992 Dependence on renal dialysis: Secondary | ICD-10-CM | POA: Diagnosis not present

## 2023-03-13 DIAGNOSIS — N186 End stage renal disease: Secondary | ICD-10-CM | POA: Diagnosis not present

## 2023-03-13 DIAGNOSIS — Z992 Dependence on renal dialysis: Secondary | ICD-10-CM | POA: Diagnosis not present

## 2023-03-13 DIAGNOSIS — N2581 Secondary hyperparathyroidism of renal origin: Secondary | ICD-10-CM | POA: Diagnosis not present

## 2023-03-15 DIAGNOSIS — N186 End stage renal disease: Secondary | ICD-10-CM | POA: Diagnosis not present

## 2023-03-15 DIAGNOSIS — Z992 Dependence on renal dialysis: Secondary | ICD-10-CM | POA: Diagnosis not present

## 2023-03-15 DIAGNOSIS — N2581 Secondary hyperparathyroidism of renal origin: Secondary | ICD-10-CM | POA: Diagnosis not present

## 2023-03-18 DIAGNOSIS — N186 End stage renal disease: Secondary | ICD-10-CM | POA: Diagnosis not present

## 2023-03-18 DIAGNOSIS — Z992 Dependence on renal dialysis: Secondary | ICD-10-CM | POA: Diagnosis not present

## 2023-03-18 DIAGNOSIS — N2581 Secondary hyperparathyroidism of renal origin: Secondary | ICD-10-CM | POA: Diagnosis not present

## 2023-03-20 DIAGNOSIS — N2581 Secondary hyperparathyroidism of renal origin: Secondary | ICD-10-CM | POA: Diagnosis not present

## 2023-03-20 DIAGNOSIS — N186 End stage renal disease: Secondary | ICD-10-CM | POA: Diagnosis not present

## 2023-03-20 DIAGNOSIS — Z992 Dependence on renal dialysis: Secondary | ICD-10-CM | POA: Diagnosis not present

## 2023-03-22 DIAGNOSIS — N2581 Secondary hyperparathyroidism of renal origin: Secondary | ICD-10-CM | POA: Diagnosis not present

## 2023-03-22 DIAGNOSIS — N186 End stage renal disease: Secondary | ICD-10-CM | POA: Diagnosis not present

## 2023-03-22 DIAGNOSIS — Z992 Dependence on renal dialysis: Secondary | ICD-10-CM | POA: Diagnosis not present

## 2023-03-25 DIAGNOSIS — N186 End stage renal disease: Secondary | ICD-10-CM | POA: Diagnosis not present

## 2023-03-25 DIAGNOSIS — Z992 Dependence on renal dialysis: Secondary | ICD-10-CM | POA: Diagnosis not present

## 2023-03-25 DIAGNOSIS — N2581 Secondary hyperparathyroidism of renal origin: Secondary | ICD-10-CM | POA: Diagnosis not present

## 2023-03-27 DIAGNOSIS — N2581 Secondary hyperparathyroidism of renal origin: Secondary | ICD-10-CM | POA: Diagnosis not present

## 2023-03-27 DIAGNOSIS — N186 End stage renal disease: Secondary | ICD-10-CM | POA: Diagnosis not present

## 2023-03-27 DIAGNOSIS — Z992 Dependence on renal dialysis: Secondary | ICD-10-CM | POA: Diagnosis not present

## 2023-03-29 DIAGNOSIS — N2581 Secondary hyperparathyroidism of renal origin: Secondary | ICD-10-CM | POA: Diagnosis not present

## 2023-03-29 DIAGNOSIS — Z992 Dependence on renal dialysis: Secondary | ICD-10-CM | POA: Diagnosis not present

## 2023-03-29 DIAGNOSIS — N186 End stage renal disease: Secondary | ICD-10-CM | POA: Diagnosis not present

## 2023-04-01 DIAGNOSIS — N2581 Secondary hyperparathyroidism of renal origin: Secondary | ICD-10-CM | POA: Diagnosis not present

## 2023-04-01 DIAGNOSIS — Z992 Dependence on renal dialysis: Secondary | ICD-10-CM | POA: Diagnosis not present

## 2023-04-01 DIAGNOSIS — N186 End stage renal disease: Secondary | ICD-10-CM | POA: Diagnosis not present

## 2023-04-03 DIAGNOSIS — Z992 Dependence on renal dialysis: Secondary | ICD-10-CM | POA: Diagnosis not present

## 2023-04-03 DIAGNOSIS — N186 End stage renal disease: Secondary | ICD-10-CM | POA: Diagnosis not present

## 2023-04-03 DIAGNOSIS — N2581 Secondary hyperparathyroidism of renal origin: Secondary | ICD-10-CM | POA: Diagnosis not present

## 2023-04-05 DIAGNOSIS — Z992 Dependence on renal dialysis: Secondary | ICD-10-CM | POA: Diagnosis not present

## 2023-04-05 DIAGNOSIS — N186 End stage renal disease: Secondary | ICD-10-CM | POA: Diagnosis not present

## 2023-04-05 DIAGNOSIS — N2581 Secondary hyperparathyroidism of renal origin: Secondary | ICD-10-CM | POA: Diagnosis not present

## 2023-04-06 DIAGNOSIS — E1122 Type 2 diabetes mellitus with diabetic chronic kidney disease: Secondary | ICD-10-CM | POA: Diagnosis not present

## 2023-04-06 DIAGNOSIS — N186 End stage renal disease: Secondary | ICD-10-CM | POA: Diagnosis not present

## 2023-04-06 DIAGNOSIS — Z992 Dependence on renal dialysis: Secondary | ICD-10-CM | POA: Diagnosis not present

## 2023-04-08 DIAGNOSIS — N2581 Secondary hyperparathyroidism of renal origin: Secondary | ICD-10-CM | POA: Diagnosis not present

## 2023-04-08 DIAGNOSIS — N186 End stage renal disease: Secondary | ICD-10-CM | POA: Diagnosis not present

## 2023-04-08 DIAGNOSIS — Z992 Dependence on renal dialysis: Secondary | ICD-10-CM | POA: Diagnosis not present

## 2023-04-10 DIAGNOSIS — N2581 Secondary hyperparathyroidism of renal origin: Secondary | ICD-10-CM | POA: Diagnosis not present

## 2023-04-10 DIAGNOSIS — Z992 Dependence on renal dialysis: Secondary | ICD-10-CM | POA: Diagnosis not present

## 2023-04-10 DIAGNOSIS — N186 End stage renal disease: Secondary | ICD-10-CM | POA: Diagnosis not present

## 2023-04-12 DIAGNOSIS — N2581 Secondary hyperparathyroidism of renal origin: Secondary | ICD-10-CM | POA: Diagnosis not present

## 2023-04-12 DIAGNOSIS — Z992 Dependence on renal dialysis: Secondary | ICD-10-CM | POA: Diagnosis not present

## 2023-04-12 DIAGNOSIS — N186 End stage renal disease: Secondary | ICD-10-CM | POA: Diagnosis not present

## 2023-04-15 DIAGNOSIS — Z23 Encounter for immunization: Secondary | ICD-10-CM | POA: Diagnosis not present

## 2023-04-15 DIAGNOSIS — N186 End stage renal disease: Secondary | ICD-10-CM | POA: Diagnosis not present

## 2023-04-15 DIAGNOSIS — N2581 Secondary hyperparathyroidism of renal origin: Secondary | ICD-10-CM | POA: Diagnosis not present

## 2023-04-15 DIAGNOSIS — Z992 Dependence on renal dialysis: Secondary | ICD-10-CM | POA: Diagnosis not present

## 2023-04-17 DIAGNOSIS — Z992 Dependence on renal dialysis: Secondary | ICD-10-CM | POA: Diagnosis not present

## 2023-04-17 DIAGNOSIS — N186 End stage renal disease: Secondary | ICD-10-CM | POA: Diagnosis not present

## 2023-04-17 DIAGNOSIS — N2581 Secondary hyperparathyroidism of renal origin: Secondary | ICD-10-CM | POA: Diagnosis not present

## 2023-04-17 DIAGNOSIS — Z23 Encounter for immunization: Secondary | ICD-10-CM | POA: Diagnosis not present

## 2023-04-19 DIAGNOSIS — N186 End stage renal disease: Secondary | ICD-10-CM | POA: Diagnosis not present

## 2023-04-19 DIAGNOSIS — Z23 Encounter for immunization: Secondary | ICD-10-CM | POA: Diagnosis not present

## 2023-04-19 DIAGNOSIS — Z992 Dependence on renal dialysis: Secondary | ICD-10-CM | POA: Diagnosis not present

## 2023-04-19 DIAGNOSIS — N2581 Secondary hyperparathyroidism of renal origin: Secondary | ICD-10-CM | POA: Diagnosis not present

## 2023-04-22 DIAGNOSIS — Z992 Dependence on renal dialysis: Secondary | ICD-10-CM | POA: Diagnosis not present

## 2023-04-22 DIAGNOSIS — N186 End stage renal disease: Secondary | ICD-10-CM | POA: Diagnosis not present

## 2023-04-22 DIAGNOSIS — N2581 Secondary hyperparathyroidism of renal origin: Secondary | ICD-10-CM | POA: Diagnosis not present

## 2023-04-24 DIAGNOSIS — N186 End stage renal disease: Secondary | ICD-10-CM | POA: Diagnosis not present

## 2023-04-24 DIAGNOSIS — N2581 Secondary hyperparathyroidism of renal origin: Secondary | ICD-10-CM | POA: Diagnosis not present

## 2023-04-24 DIAGNOSIS — Z992 Dependence on renal dialysis: Secondary | ICD-10-CM | POA: Diagnosis not present

## 2023-04-26 DIAGNOSIS — N2581 Secondary hyperparathyroidism of renal origin: Secondary | ICD-10-CM | POA: Diagnosis not present

## 2023-04-26 DIAGNOSIS — N186 End stage renal disease: Secondary | ICD-10-CM | POA: Diagnosis not present

## 2023-04-26 DIAGNOSIS — Z992 Dependence on renal dialysis: Secondary | ICD-10-CM | POA: Diagnosis not present

## 2023-04-29 DIAGNOSIS — N2581 Secondary hyperparathyroidism of renal origin: Secondary | ICD-10-CM | POA: Diagnosis not present

## 2023-04-29 DIAGNOSIS — Z992 Dependence on renal dialysis: Secondary | ICD-10-CM | POA: Diagnosis not present

## 2023-04-29 DIAGNOSIS — N186 End stage renal disease: Secondary | ICD-10-CM | POA: Diagnosis not present

## 2023-05-01 DIAGNOSIS — Z992 Dependence on renal dialysis: Secondary | ICD-10-CM | POA: Diagnosis not present

## 2023-05-01 DIAGNOSIS — N2581 Secondary hyperparathyroidism of renal origin: Secondary | ICD-10-CM | POA: Diagnosis not present

## 2023-05-01 DIAGNOSIS — N186 End stage renal disease: Secondary | ICD-10-CM | POA: Diagnosis not present

## 2023-05-03 DIAGNOSIS — N2581 Secondary hyperparathyroidism of renal origin: Secondary | ICD-10-CM | POA: Diagnosis not present

## 2023-05-03 DIAGNOSIS — Z992 Dependence on renal dialysis: Secondary | ICD-10-CM | POA: Diagnosis not present

## 2023-05-03 DIAGNOSIS — N186 End stage renal disease: Secondary | ICD-10-CM | POA: Diagnosis not present

## 2023-05-06 DIAGNOSIS — N2581 Secondary hyperparathyroidism of renal origin: Secondary | ICD-10-CM | POA: Diagnosis not present

## 2023-05-06 DIAGNOSIS — Z992 Dependence on renal dialysis: Secondary | ICD-10-CM | POA: Diagnosis not present

## 2023-05-06 DIAGNOSIS — N186 End stage renal disease: Secondary | ICD-10-CM | POA: Diagnosis not present

## 2023-05-07 DIAGNOSIS — N186 End stage renal disease: Secondary | ICD-10-CM | POA: Diagnosis not present

## 2023-05-07 DIAGNOSIS — Z992 Dependence on renal dialysis: Secondary | ICD-10-CM | POA: Diagnosis not present

## 2023-05-07 DIAGNOSIS — E1122 Type 2 diabetes mellitus with diabetic chronic kidney disease: Secondary | ICD-10-CM | POA: Diagnosis not present

## 2023-05-08 DIAGNOSIS — Z992 Dependence on renal dialysis: Secondary | ICD-10-CM | POA: Diagnosis not present

## 2023-05-08 DIAGNOSIS — N2581 Secondary hyperparathyroidism of renal origin: Secondary | ICD-10-CM | POA: Diagnosis not present

## 2023-05-08 DIAGNOSIS — N186 End stage renal disease: Secondary | ICD-10-CM | POA: Diagnosis not present

## 2023-05-10 DIAGNOSIS — N2581 Secondary hyperparathyroidism of renal origin: Secondary | ICD-10-CM | POA: Diagnosis not present

## 2023-05-10 DIAGNOSIS — Z992 Dependence on renal dialysis: Secondary | ICD-10-CM | POA: Diagnosis not present

## 2023-05-10 DIAGNOSIS — N186 End stage renal disease: Secondary | ICD-10-CM | POA: Diagnosis not present

## 2023-05-13 DIAGNOSIS — N2581 Secondary hyperparathyroidism of renal origin: Secondary | ICD-10-CM | POA: Diagnosis not present

## 2023-05-13 DIAGNOSIS — Z992 Dependence on renal dialysis: Secondary | ICD-10-CM | POA: Diagnosis not present

## 2023-05-13 DIAGNOSIS — N186 End stage renal disease: Secondary | ICD-10-CM | POA: Diagnosis not present

## 2023-05-15 DIAGNOSIS — N2581 Secondary hyperparathyroidism of renal origin: Secondary | ICD-10-CM | POA: Diagnosis not present

## 2023-05-15 DIAGNOSIS — N186 End stage renal disease: Secondary | ICD-10-CM | POA: Diagnosis not present

## 2023-05-15 DIAGNOSIS — Z992 Dependence on renal dialysis: Secondary | ICD-10-CM | POA: Diagnosis not present

## 2023-05-17 DIAGNOSIS — Z992 Dependence on renal dialysis: Secondary | ICD-10-CM | POA: Diagnosis not present

## 2023-05-17 DIAGNOSIS — N2581 Secondary hyperparathyroidism of renal origin: Secondary | ICD-10-CM | POA: Diagnosis not present

## 2023-05-17 DIAGNOSIS — N186 End stage renal disease: Secondary | ICD-10-CM | POA: Diagnosis not present

## 2023-05-20 DIAGNOSIS — Z992 Dependence on renal dialysis: Secondary | ICD-10-CM | POA: Diagnosis not present

## 2023-05-20 DIAGNOSIS — N186 End stage renal disease: Secondary | ICD-10-CM | POA: Diagnosis not present

## 2023-05-20 DIAGNOSIS — N2581 Secondary hyperparathyroidism of renal origin: Secondary | ICD-10-CM | POA: Diagnosis not present

## 2023-05-22 DIAGNOSIS — N2581 Secondary hyperparathyroidism of renal origin: Secondary | ICD-10-CM | POA: Diagnosis not present

## 2023-05-22 DIAGNOSIS — N186 End stage renal disease: Secondary | ICD-10-CM | POA: Diagnosis not present

## 2023-05-22 DIAGNOSIS — Z992 Dependence on renal dialysis: Secondary | ICD-10-CM | POA: Diagnosis not present

## 2023-05-24 DIAGNOSIS — N186 End stage renal disease: Secondary | ICD-10-CM | POA: Diagnosis not present

## 2023-05-24 DIAGNOSIS — Z992 Dependence on renal dialysis: Secondary | ICD-10-CM | POA: Diagnosis not present

## 2023-05-24 DIAGNOSIS — N2581 Secondary hyperparathyroidism of renal origin: Secondary | ICD-10-CM | POA: Diagnosis not present

## 2023-05-27 DIAGNOSIS — N186 End stage renal disease: Secondary | ICD-10-CM | POA: Diagnosis not present

## 2023-05-27 DIAGNOSIS — N2581 Secondary hyperparathyroidism of renal origin: Secondary | ICD-10-CM | POA: Diagnosis not present

## 2023-05-27 DIAGNOSIS — Z992 Dependence on renal dialysis: Secondary | ICD-10-CM | POA: Diagnosis not present

## 2023-05-29 DIAGNOSIS — N2581 Secondary hyperparathyroidism of renal origin: Secondary | ICD-10-CM | POA: Diagnosis not present

## 2023-05-29 DIAGNOSIS — N186 End stage renal disease: Secondary | ICD-10-CM | POA: Diagnosis not present

## 2023-05-29 DIAGNOSIS — Z992 Dependence on renal dialysis: Secondary | ICD-10-CM | POA: Diagnosis not present

## 2023-05-31 DIAGNOSIS — N2581 Secondary hyperparathyroidism of renal origin: Secondary | ICD-10-CM | POA: Diagnosis not present

## 2023-05-31 DIAGNOSIS — Z992 Dependence on renal dialysis: Secondary | ICD-10-CM | POA: Diagnosis not present

## 2023-05-31 DIAGNOSIS — N186 End stage renal disease: Secondary | ICD-10-CM | POA: Diagnosis not present

## 2023-06-03 DIAGNOSIS — Z992 Dependence on renal dialysis: Secondary | ICD-10-CM | POA: Diagnosis not present

## 2023-06-03 DIAGNOSIS — N2581 Secondary hyperparathyroidism of renal origin: Secondary | ICD-10-CM | POA: Diagnosis not present

## 2023-06-03 DIAGNOSIS — N186 End stage renal disease: Secondary | ICD-10-CM | POA: Diagnosis not present

## 2023-06-05 DIAGNOSIS — Z992 Dependence on renal dialysis: Secondary | ICD-10-CM | POA: Diagnosis not present

## 2023-06-05 DIAGNOSIS — N2581 Secondary hyperparathyroidism of renal origin: Secondary | ICD-10-CM | POA: Diagnosis not present

## 2023-06-05 DIAGNOSIS — N186 End stage renal disease: Secondary | ICD-10-CM | POA: Diagnosis not present

## 2023-06-07 DIAGNOSIS — E1122 Type 2 diabetes mellitus with diabetic chronic kidney disease: Secondary | ICD-10-CM | POA: Diagnosis not present

## 2023-06-07 DIAGNOSIS — N2581 Secondary hyperparathyroidism of renal origin: Secondary | ICD-10-CM | POA: Diagnosis not present

## 2023-06-07 DIAGNOSIS — N186 End stage renal disease: Secondary | ICD-10-CM | POA: Diagnosis not present

## 2023-06-07 DIAGNOSIS — Z992 Dependence on renal dialysis: Secondary | ICD-10-CM | POA: Diagnosis not present

## 2023-06-10 DIAGNOSIS — N2581 Secondary hyperparathyroidism of renal origin: Secondary | ICD-10-CM | POA: Diagnosis not present

## 2023-06-10 DIAGNOSIS — Z992 Dependence on renal dialysis: Secondary | ICD-10-CM | POA: Diagnosis not present

## 2023-06-10 DIAGNOSIS — N186 End stage renal disease: Secondary | ICD-10-CM | POA: Diagnosis not present

## 2023-06-12 DIAGNOSIS — N2581 Secondary hyperparathyroidism of renal origin: Secondary | ICD-10-CM | POA: Diagnosis not present

## 2023-06-12 DIAGNOSIS — N186 End stage renal disease: Secondary | ICD-10-CM | POA: Diagnosis not present

## 2023-06-12 DIAGNOSIS — Z992 Dependence on renal dialysis: Secondary | ICD-10-CM | POA: Diagnosis not present

## 2023-06-14 DIAGNOSIS — Z992 Dependence on renal dialysis: Secondary | ICD-10-CM | POA: Diagnosis not present

## 2023-06-14 DIAGNOSIS — N2581 Secondary hyperparathyroidism of renal origin: Secondary | ICD-10-CM | POA: Diagnosis not present

## 2023-06-14 DIAGNOSIS — N186 End stage renal disease: Secondary | ICD-10-CM | POA: Diagnosis not present

## 2023-06-17 DIAGNOSIS — N186 End stage renal disease: Secondary | ICD-10-CM | POA: Diagnosis not present

## 2023-06-17 DIAGNOSIS — Z992 Dependence on renal dialysis: Secondary | ICD-10-CM | POA: Diagnosis not present

## 2023-06-17 DIAGNOSIS — N2581 Secondary hyperparathyroidism of renal origin: Secondary | ICD-10-CM | POA: Diagnosis not present

## 2023-06-19 DIAGNOSIS — Z992 Dependence on renal dialysis: Secondary | ICD-10-CM | POA: Diagnosis not present

## 2023-06-19 DIAGNOSIS — N2581 Secondary hyperparathyroidism of renal origin: Secondary | ICD-10-CM | POA: Diagnosis not present

## 2023-06-19 DIAGNOSIS — N186 End stage renal disease: Secondary | ICD-10-CM | POA: Diagnosis not present

## 2023-06-21 DIAGNOSIS — N186 End stage renal disease: Secondary | ICD-10-CM | POA: Diagnosis not present

## 2023-06-21 DIAGNOSIS — N2581 Secondary hyperparathyroidism of renal origin: Secondary | ICD-10-CM | POA: Diagnosis not present

## 2023-06-21 DIAGNOSIS — Z992 Dependence on renal dialysis: Secondary | ICD-10-CM | POA: Diagnosis not present

## 2023-06-24 DIAGNOSIS — N186 End stage renal disease: Secondary | ICD-10-CM | POA: Diagnosis not present

## 2023-06-24 DIAGNOSIS — Z992 Dependence on renal dialysis: Secondary | ICD-10-CM | POA: Diagnosis not present

## 2023-06-24 DIAGNOSIS — N2581 Secondary hyperparathyroidism of renal origin: Secondary | ICD-10-CM | POA: Diagnosis not present

## 2023-06-26 DIAGNOSIS — N2581 Secondary hyperparathyroidism of renal origin: Secondary | ICD-10-CM | POA: Diagnosis not present

## 2023-06-26 DIAGNOSIS — N186 End stage renal disease: Secondary | ICD-10-CM | POA: Diagnosis not present

## 2023-06-26 DIAGNOSIS — Z992 Dependence on renal dialysis: Secondary | ICD-10-CM | POA: Diagnosis not present

## 2023-06-28 DIAGNOSIS — Z992 Dependence on renal dialysis: Secondary | ICD-10-CM | POA: Diagnosis not present

## 2023-06-28 DIAGNOSIS — N186 End stage renal disease: Secondary | ICD-10-CM | POA: Diagnosis not present

## 2023-06-28 DIAGNOSIS — N2581 Secondary hyperparathyroidism of renal origin: Secondary | ICD-10-CM | POA: Diagnosis not present

## 2023-07-01 DIAGNOSIS — Z992 Dependence on renal dialysis: Secondary | ICD-10-CM | POA: Diagnosis not present

## 2023-07-01 DIAGNOSIS — N186 End stage renal disease: Secondary | ICD-10-CM | POA: Diagnosis not present

## 2023-07-01 DIAGNOSIS — N2581 Secondary hyperparathyroidism of renal origin: Secondary | ICD-10-CM | POA: Diagnosis not present

## 2023-07-03 DIAGNOSIS — Z992 Dependence on renal dialysis: Secondary | ICD-10-CM | POA: Diagnosis not present

## 2023-07-03 DIAGNOSIS — N186 End stage renal disease: Secondary | ICD-10-CM | POA: Diagnosis not present

## 2023-07-03 DIAGNOSIS — N2581 Secondary hyperparathyroidism of renal origin: Secondary | ICD-10-CM | POA: Diagnosis not present

## 2023-07-05 DIAGNOSIS — N2581 Secondary hyperparathyroidism of renal origin: Secondary | ICD-10-CM | POA: Diagnosis not present

## 2023-07-05 DIAGNOSIS — Z992 Dependence on renal dialysis: Secondary | ICD-10-CM | POA: Diagnosis not present

## 2023-07-05 DIAGNOSIS — N186 End stage renal disease: Secondary | ICD-10-CM | POA: Diagnosis not present

## 2023-07-07 DIAGNOSIS — Z992 Dependence on renal dialysis: Secondary | ICD-10-CM | POA: Diagnosis not present

## 2023-07-07 DIAGNOSIS — E1122 Type 2 diabetes mellitus with diabetic chronic kidney disease: Secondary | ICD-10-CM | POA: Diagnosis not present

## 2023-07-07 DIAGNOSIS — N186 End stage renal disease: Secondary | ICD-10-CM | POA: Diagnosis not present

## 2023-07-08 DIAGNOSIS — N2581 Secondary hyperparathyroidism of renal origin: Secondary | ICD-10-CM | POA: Diagnosis not present

## 2023-07-08 DIAGNOSIS — N186 End stage renal disease: Secondary | ICD-10-CM | POA: Diagnosis not present

## 2023-07-08 DIAGNOSIS — Z992 Dependence on renal dialysis: Secondary | ICD-10-CM | POA: Diagnosis not present

## 2023-07-10 DIAGNOSIS — N186 End stage renal disease: Secondary | ICD-10-CM | POA: Diagnosis not present

## 2023-07-10 DIAGNOSIS — Z992 Dependence on renal dialysis: Secondary | ICD-10-CM | POA: Diagnosis not present

## 2023-07-10 DIAGNOSIS — N2581 Secondary hyperparathyroidism of renal origin: Secondary | ICD-10-CM | POA: Diagnosis not present

## 2023-07-12 DIAGNOSIS — N186 End stage renal disease: Secondary | ICD-10-CM | POA: Diagnosis not present

## 2023-07-12 DIAGNOSIS — N2581 Secondary hyperparathyroidism of renal origin: Secondary | ICD-10-CM | POA: Diagnosis not present

## 2023-07-12 DIAGNOSIS — Z992 Dependence on renal dialysis: Secondary | ICD-10-CM | POA: Diagnosis not present

## 2023-07-15 DIAGNOSIS — N2581 Secondary hyperparathyroidism of renal origin: Secondary | ICD-10-CM | POA: Diagnosis not present

## 2023-07-15 DIAGNOSIS — Z992 Dependence on renal dialysis: Secondary | ICD-10-CM | POA: Diagnosis not present

## 2023-07-15 DIAGNOSIS — N186 End stage renal disease: Secondary | ICD-10-CM | POA: Diagnosis not present

## 2023-07-17 DIAGNOSIS — N186 End stage renal disease: Secondary | ICD-10-CM | POA: Diagnosis not present

## 2023-07-17 DIAGNOSIS — Z992 Dependence on renal dialysis: Secondary | ICD-10-CM | POA: Diagnosis not present

## 2023-07-17 DIAGNOSIS — N2581 Secondary hyperparathyroidism of renal origin: Secondary | ICD-10-CM | POA: Diagnosis not present

## 2023-07-19 DIAGNOSIS — N2581 Secondary hyperparathyroidism of renal origin: Secondary | ICD-10-CM | POA: Diagnosis not present

## 2023-07-19 DIAGNOSIS — Z992 Dependence on renal dialysis: Secondary | ICD-10-CM | POA: Diagnosis not present

## 2023-07-19 DIAGNOSIS — N186 End stage renal disease: Secondary | ICD-10-CM | POA: Diagnosis not present

## 2023-07-22 DIAGNOSIS — N2581 Secondary hyperparathyroidism of renal origin: Secondary | ICD-10-CM | POA: Diagnosis not present

## 2023-07-22 DIAGNOSIS — Z992 Dependence on renal dialysis: Secondary | ICD-10-CM | POA: Diagnosis not present

## 2023-07-22 DIAGNOSIS — N186 End stage renal disease: Secondary | ICD-10-CM | POA: Diagnosis not present

## 2023-07-24 DIAGNOSIS — N2581 Secondary hyperparathyroidism of renal origin: Secondary | ICD-10-CM | POA: Diagnosis not present

## 2023-07-24 DIAGNOSIS — N186 End stage renal disease: Secondary | ICD-10-CM | POA: Diagnosis not present

## 2023-07-24 DIAGNOSIS — Z992 Dependence on renal dialysis: Secondary | ICD-10-CM | POA: Diagnosis not present

## 2023-07-26 DIAGNOSIS — Z992 Dependence on renal dialysis: Secondary | ICD-10-CM | POA: Diagnosis not present

## 2023-07-26 DIAGNOSIS — N2581 Secondary hyperparathyroidism of renal origin: Secondary | ICD-10-CM | POA: Diagnosis not present

## 2023-07-26 DIAGNOSIS — N186 End stage renal disease: Secondary | ICD-10-CM | POA: Diagnosis not present

## 2023-07-29 DIAGNOSIS — N186 End stage renal disease: Secondary | ICD-10-CM | POA: Diagnosis not present

## 2023-07-29 DIAGNOSIS — Z992 Dependence on renal dialysis: Secondary | ICD-10-CM | POA: Diagnosis not present

## 2023-07-29 DIAGNOSIS — Z23 Encounter for immunization: Secondary | ICD-10-CM | POA: Diagnosis not present

## 2023-07-29 DIAGNOSIS — N2581 Secondary hyperparathyroidism of renal origin: Secondary | ICD-10-CM | POA: Diagnosis not present

## 2023-07-31 DIAGNOSIS — Z992 Dependence on renal dialysis: Secondary | ICD-10-CM | POA: Diagnosis not present

## 2023-07-31 DIAGNOSIS — N2581 Secondary hyperparathyroidism of renal origin: Secondary | ICD-10-CM | POA: Diagnosis not present

## 2023-07-31 DIAGNOSIS — Z23 Encounter for immunization: Secondary | ICD-10-CM | POA: Diagnosis not present

## 2023-07-31 DIAGNOSIS — N186 End stage renal disease: Secondary | ICD-10-CM | POA: Diagnosis not present

## 2023-08-02 DIAGNOSIS — N2581 Secondary hyperparathyroidism of renal origin: Secondary | ICD-10-CM | POA: Diagnosis not present

## 2023-08-02 DIAGNOSIS — N186 End stage renal disease: Secondary | ICD-10-CM | POA: Diagnosis not present

## 2023-08-02 DIAGNOSIS — Z992 Dependence on renal dialysis: Secondary | ICD-10-CM | POA: Diagnosis not present

## 2023-08-02 DIAGNOSIS — Z23 Encounter for immunization: Secondary | ICD-10-CM | POA: Diagnosis not present

## 2023-08-05 DIAGNOSIS — N186 End stage renal disease: Secondary | ICD-10-CM | POA: Diagnosis not present

## 2023-08-05 DIAGNOSIS — N2581 Secondary hyperparathyroidism of renal origin: Secondary | ICD-10-CM | POA: Diagnosis not present

## 2023-08-05 DIAGNOSIS — Z992 Dependence on renal dialysis: Secondary | ICD-10-CM | POA: Diagnosis not present

## 2023-08-07 DIAGNOSIS — N2581 Secondary hyperparathyroidism of renal origin: Secondary | ICD-10-CM | POA: Diagnosis not present

## 2023-08-07 DIAGNOSIS — Z992 Dependence on renal dialysis: Secondary | ICD-10-CM | POA: Diagnosis not present

## 2023-08-07 DIAGNOSIS — N186 End stage renal disease: Secondary | ICD-10-CM | POA: Diagnosis not present

## 2023-08-07 DIAGNOSIS — E1122 Type 2 diabetes mellitus with diabetic chronic kidney disease: Secondary | ICD-10-CM | POA: Diagnosis not present

## 2023-08-09 DIAGNOSIS — N2581 Secondary hyperparathyroidism of renal origin: Secondary | ICD-10-CM | POA: Diagnosis not present

## 2023-08-09 DIAGNOSIS — N186 End stage renal disease: Secondary | ICD-10-CM | POA: Diagnosis not present

## 2023-08-09 DIAGNOSIS — Z992 Dependence on renal dialysis: Secondary | ICD-10-CM | POA: Diagnosis not present

## 2023-08-12 DIAGNOSIS — N186 End stage renal disease: Secondary | ICD-10-CM | POA: Diagnosis not present

## 2023-08-12 DIAGNOSIS — Z992 Dependence on renal dialysis: Secondary | ICD-10-CM | POA: Diagnosis not present

## 2023-08-12 DIAGNOSIS — N2581 Secondary hyperparathyroidism of renal origin: Secondary | ICD-10-CM | POA: Diagnosis not present

## 2023-08-14 DIAGNOSIS — N2581 Secondary hyperparathyroidism of renal origin: Secondary | ICD-10-CM | POA: Diagnosis not present

## 2023-08-14 DIAGNOSIS — Z992 Dependence on renal dialysis: Secondary | ICD-10-CM | POA: Diagnosis not present

## 2023-08-14 DIAGNOSIS — N186 End stage renal disease: Secondary | ICD-10-CM | POA: Diagnosis not present

## 2023-08-16 DIAGNOSIS — Z992 Dependence on renal dialysis: Secondary | ICD-10-CM | POA: Diagnosis not present

## 2023-08-16 DIAGNOSIS — N2581 Secondary hyperparathyroidism of renal origin: Secondary | ICD-10-CM | POA: Diagnosis not present

## 2023-08-16 DIAGNOSIS — N186 End stage renal disease: Secondary | ICD-10-CM | POA: Diagnosis not present

## 2023-08-19 DIAGNOSIS — N186 End stage renal disease: Secondary | ICD-10-CM | POA: Diagnosis not present

## 2023-08-19 DIAGNOSIS — N2581 Secondary hyperparathyroidism of renal origin: Secondary | ICD-10-CM | POA: Diagnosis not present

## 2023-08-19 DIAGNOSIS — Z992 Dependence on renal dialysis: Secondary | ICD-10-CM | POA: Diagnosis not present

## 2023-08-21 DIAGNOSIS — N2581 Secondary hyperparathyroidism of renal origin: Secondary | ICD-10-CM | POA: Diagnosis not present

## 2023-08-21 DIAGNOSIS — Z992 Dependence on renal dialysis: Secondary | ICD-10-CM | POA: Diagnosis not present

## 2023-08-21 DIAGNOSIS — N186 End stage renal disease: Secondary | ICD-10-CM | POA: Diagnosis not present

## 2023-08-23 DIAGNOSIS — Z992 Dependence on renal dialysis: Secondary | ICD-10-CM | POA: Diagnosis not present

## 2023-08-23 DIAGNOSIS — N2581 Secondary hyperparathyroidism of renal origin: Secondary | ICD-10-CM | POA: Diagnosis not present

## 2023-08-23 DIAGNOSIS — N186 End stage renal disease: Secondary | ICD-10-CM | POA: Diagnosis not present

## 2023-08-26 DIAGNOSIS — Z992 Dependence on renal dialysis: Secondary | ICD-10-CM | POA: Diagnosis not present

## 2023-08-26 DIAGNOSIS — N186 End stage renal disease: Secondary | ICD-10-CM | POA: Diagnosis not present

## 2023-08-26 DIAGNOSIS — N2581 Secondary hyperparathyroidism of renal origin: Secondary | ICD-10-CM | POA: Diagnosis not present

## 2023-08-28 DIAGNOSIS — Z992 Dependence on renal dialysis: Secondary | ICD-10-CM | POA: Diagnosis not present

## 2023-08-28 DIAGNOSIS — N2581 Secondary hyperparathyroidism of renal origin: Secondary | ICD-10-CM | POA: Diagnosis not present

## 2023-08-28 DIAGNOSIS — N186 End stage renal disease: Secondary | ICD-10-CM | POA: Diagnosis not present

## 2023-08-30 DIAGNOSIS — N186 End stage renal disease: Secondary | ICD-10-CM | POA: Diagnosis not present

## 2023-08-30 DIAGNOSIS — N2581 Secondary hyperparathyroidism of renal origin: Secondary | ICD-10-CM | POA: Diagnosis not present

## 2023-08-30 DIAGNOSIS — Z992 Dependence on renal dialysis: Secondary | ICD-10-CM | POA: Diagnosis not present

## 2023-09-01 DIAGNOSIS — N186 End stage renal disease: Secondary | ICD-10-CM | POA: Diagnosis not present

## 2023-09-01 DIAGNOSIS — Z992 Dependence on renal dialysis: Secondary | ICD-10-CM | POA: Diagnosis not present

## 2023-09-01 DIAGNOSIS — N2581 Secondary hyperparathyroidism of renal origin: Secondary | ICD-10-CM | POA: Diagnosis not present

## 2023-09-03 DIAGNOSIS — N186 End stage renal disease: Secondary | ICD-10-CM | POA: Diagnosis not present

## 2023-09-03 DIAGNOSIS — N2581 Secondary hyperparathyroidism of renal origin: Secondary | ICD-10-CM | POA: Diagnosis not present

## 2023-09-03 DIAGNOSIS — Z992 Dependence on renal dialysis: Secondary | ICD-10-CM | POA: Diagnosis not present

## 2023-09-06 DIAGNOSIS — E1122 Type 2 diabetes mellitus with diabetic chronic kidney disease: Secondary | ICD-10-CM | POA: Diagnosis not present

## 2023-09-06 DIAGNOSIS — Z992 Dependence on renal dialysis: Secondary | ICD-10-CM | POA: Diagnosis not present

## 2023-09-06 DIAGNOSIS — N186 End stage renal disease: Secondary | ICD-10-CM | POA: Diagnosis not present

## 2023-09-06 DIAGNOSIS — N2581 Secondary hyperparathyroidism of renal origin: Secondary | ICD-10-CM | POA: Diagnosis not present

## 2023-09-09 DIAGNOSIS — Z992 Dependence on renal dialysis: Secondary | ICD-10-CM | POA: Diagnosis not present

## 2023-09-09 DIAGNOSIS — N2581 Secondary hyperparathyroidism of renal origin: Secondary | ICD-10-CM | POA: Diagnosis not present

## 2023-09-09 DIAGNOSIS — N186 End stage renal disease: Secondary | ICD-10-CM | POA: Diagnosis not present

## 2023-09-11 DIAGNOSIS — N2581 Secondary hyperparathyroidism of renal origin: Secondary | ICD-10-CM | POA: Diagnosis not present

## 2023-09-11 DIAGNOSIS — N186 End stage renal disease: Secondary | ICD-10-CM | POA: Diagnosis not present

## 2023-09-11 DIAGNOSIS — Z992 Dependence on renal dialysis: Secondary | ICD-10-CM | POA: Diagnosis not present

## 2023-09-13 DIAGNOSIS — Z992 Dependence on renal dialysis: Secondary | ICD-10-CM | POA: Diagnosis not present

## 2023-09-13 DIAGNOSIS — N186 End stage renal disease: Secondary | ICD-10-CM | POA: Diagnosis not present

## 2023-09-13 DIAGNOSIS — N2581 Secondary hyperparathyroidism of renal origin: Secondary | ICD-10-CM | POA: Diagnosis not present

## 2023-09-16 DIAGNOSIS — Z992 Dependence on renal dialysis: Secondary | ICD-10-CM | POA: Diagnosis not present

## 2023-09-16 DIAGNOSIS — N2581 Secondary hyperparathyroidism of renal origin: Secondary | ICD-10-CM | POA: Diagnosis not present

## 2023-09-16 DIAGNOSIS — N186 End stage renal disease: Secondary | ICD-10-CM | POA: Diagnosis not present

## 2023-09-18 DIAGNOSIS — N186 End stage renal disease: Secondary | ICD-10-CM | POA: Diagnosis not present

## 2023-09-18 DIAGNOSIS — Z992 Dependence on renal dialysis: Secondary | ICD-10-CM | POA: Diagnosis not present

## 2023-09-18 DIAGNOSIS — N2581 Secondary hyperparathyroidism of renal origin: Secondary | ICD-10-CM | POA: Diagnosis not present

## 2023-09-20 DIAGNOSIS — N2581 Secondary hyperparathyroidism of renal origin: Secondary | ICD-10-CM | POA: Diagnosis not present

## 2023-09-20 DIAGNOSIS — Z992 Dependence on renal dialysis: Secondary | ICD-10-CM | POA: Diagnosis not present

## 2023-09-20 DIAGNOSIS — N186 End stage renal disease: Secondary | ICD-10-CM | POA: Diagnosis not present

## 2023-09-23 DIAGNOSIS — N2581 Secondary hyperparathyroidism of renal origin: Secondary | ICD-10-CM | POA: Diagnosis not present

## 2023-09-23 DIAGNOSIS — Z992 Dependence on renal dialysis: Secondary | ICD-10-CM | POA: Diagnosis not present

## 2023-09-23 DIAGNOSIS — N186 End stage renal disease: Secondary | ICD-10-CM | POA: Diagnosis not present

## 2023-09-25 DIAGNOSIS — Z992 Dependence on renal dialysis: Secondary | ICD-10-CM | POA: Diagnosis not present

## 2023-09-25 DIAGNOSIS — N186 End stage renal disease: Secondary | ICD-10-CM | POA: Diagnosis not present

## 2023-09-25 DIAGNOSIS — N2581 Secondary hyperparathyroidism of renal origin: Secondary | ICD-10-CM | POA: Diagnosis not present

## 2023-09-27 DIAGNOSIS — Z992 Dependence on renal dialysis: Secondary | ICD-10-CM | POA: Diagnosis not present

## 2023-09-27 DIAGNOSIS — N2581 Secondary hyperparathyroidism of renal origin: Secondary | ICD-10-CM | POA: Diagnosis not present

## 2023-09-27 DIAGNOSIS — N186 End stage renal disease: Secondary | ICD-10-CM | POA: Diagnosis not present

## 2023-09-29 DIAGNOSIS — N186 End stage renal disease: Secondary | ICD-10-CM | POA: Diagnosis not present

## 2023-09-29 DIAGNOSIS — N2581 Secondary hyperparathyroidism of renal origin: Secondary | ICD-10-CM | POA: Diagnosis not present

## 2023-09-29 DIAGNOSIS — Z992 Dependence on renal dialysis: Secondary | ICD-10-CM | POA: Diagnosis not present

## 2023-10-02 ENCOUNTER — Telehealth: Payer: Self-pay

## 2023-10-02 NOTE — Telephone Encounter (Signed)
Marchelle Folks, RN from [facility] called stating that the patient's fistula is unusable and he has no access at this time.  Reviewed pt's chart,  two identifiers used.I reviewed procedures and recommendations with Angel,RN here in the office, she stated that we should direct them to send patient to the ED for evaluation as we are unable to get the patient on the schedule for a procedure tomorrow. I let the nurse at [facility] knows this and she will tell the patient to go to the ED

## 2023-10-03 ENCOUNTER — Telehealth (HOSPITAL_COMMUNITY): Payer: Self-pay | Admitting: *Deleted

## 2023-10-03 ENCOUNTER — Other Ambulatory Visit: Payer: Self-pay

## 2023-10-03 ENCOUNTER — Encounter (HOSPITAL_COMMUNITY)
Admission: RE | Disposition: A | Discharge status: Home / Self Care | Payer: Self-pay | Source: Ambulatory Visit | Attending: Nephrology

## 2023-10-03 ENCOUNTER — Ambulatory Visit (HOSPITAL_COMMUNITY)
Admission: RE | Admit: 2023-10-03 | Discharge: 2023-10-03 | Disposition: A | Discharge status: Home / Self Care | Payer: BC Managed Care – PPO | Source: Ambulatory Visit | Attending: Nephrology | Admitting: Nephrology

## 2023-10-03 DIAGNOSIS — Z87891 Personal history of nicotine dependence: Secondary | ICD-10-CM | POA: Insufficient documentation

## 2023-10-03 DIAGNOSIS — N186 End stage renal disease: Secondary | ICD-10-CM | POA: Insufficient documentation

## 2023-10-03 DIAGNOSIS — D631 Anemia in chronic kidney disease: Secondary | ICD-10-CM | POA: Diagnosis not present

## 2023-10-03 DIAGNOSIS — Y832 Surgical operation with anastomosis, bypass or graft as the cause of abnormal reaction of the patient, or of later complication, without mention of misadventure at the time of the procedure: Secondary | ICD-10-CM | POA: Diagnosis not present

## 2023-10-03 DIAGNOSIS — J449 Chronic obstructive pulmonary disease, unspecified: Secondary | ICD-10-CM | POA: Insufficient documentation

## 2023-10-03 DIAGNOSIS — N250 Renal osteodystrophy: Secondary | ICD-10-CM | POA: Insufficient documentation

## 2023-10-03 DIAGNOSIS — Z992 Dependence on renal dialysis: Secondary | ICD-10-CM | POA: Insufficient documentation

## 2023-10-03 DIAGNOSIS — T82858A Stenosis of vascular prosthetic devices, implants and grafts, initial encounter: Secondary | ICD-10-CM | POA: Insufficient documentation

## 2023-10-03 DIAGNOSIS — I120 Hypertensive chronic kidney disease with stage 5 chronic kidney disease or end stage renal disease: Secondary | ICD-10-CM | POA: Diagnosis not present

## 2023-10-03 DIAGNOSIS — I871 Compression of vein: Secondary | ICD-10-CM | POA: Diagnosis not present

## 2023-10-03 HISTORY — PX: PERIPHERAL VASCULAR BALLOON ANGIOPLASTY: CATH118281

## 2023-10-03 HISTORY — PX: A/V FISTULAGRAM: CATH118298

## 2023-10-03 SURGERY — A/V FISTULAGRAM
Anesthesia: LOCAL

## 2023-10-03 MED ORDER — ACETAMINOPHEN 325 MG PO TABS
650.0000 mg | ORAL_TABLET | ORAL | Status: DC | PRN
Start: 2023-10-03 — End: 2023-10-03

## 2023-10-03 MED ORDER — SODIUM CHLORIDE 0.9% FLUSH: 3.0000 mL | INTRAVENOUS | Status: DC | PRN

## 2023-10-03 MED ORDER — HEPARIN (PORCINE) IN NACL 1000-0.9 UT/500ML-% IV SOLN
INTRAVENOUS | Status: DC | PRN
  Administered 2023-10-03: 500 mL

## 2023-10-03 MED ORDER — LIDOCAINE HCL (PF) 1 % IJ SOLN
INTRAMUSCULAR | Status: AC
Start: 2023-10-03 — End: ?
  Filled 2023-10-03: qty 30

## 2023-10-03 MED ORDER — MIDAZOLAM HCL 2 MG/2ML IJ SOLN
INTRAMUSCULAR | Status: DC | PRN
  Administered 2023-10-03: 1 mg via INTRAVENOUS

## 2023-10-03 MED ORDER — MIDAZOLAM HCL 2 MG/2ML IJ SOLN
INTRAMUSCULAR | Status: AC
Start: 2023-10-03 — End: ?
  Filled 2023-10-03: qty 2

## 2023-10-03 MED ORDER — FENTANYL CITRATE (PF) 100 MCG/2ML IJ SOLN
INTRAMUSCULAR | Status: AC
  Filled 2023-10-03: qty 2

## 2023-10-03 MED ORDER — FENTANYL CITRATE (PF) 100 MCG/2ML IJ SOLN
INTRAMUSCULAR | Status: DC | PRN
  Administered 2023-10-03: 50 ug via INTRAVENOUS

## 2023-10-03 MED ORDER — ONDANSETRON HCL 4 MG/2ML IJ SOLN: 4.0000 mg | Freq: Four times a day (QID) | INTRAMUSCULAR | Status: DC | PRN

## 2023-10-03 MED ORDER — LIDOCAINE HCL (PF) 1 % IJ SOLN
INTRAMUSCULAR | Status: DC | PRN
  Administered 2023-10-03: 2 mL via INTRADERMAL

## 2023-10-03 MED ORDER — SODIUM CHLORIDE 0.9 % IV SOLN: INTRAVENOUS | Status: DC

## 2023-10-03 MED ORDER — IODIXANOL 320 MG/ML IV SOLN
INTRAVENOUS | Status: DC | PRN
  Administered 2023-10-03: 10 mL via INTRA_ARTERIAL

## 2023-10-03 SURGICAL SUPPLY — 9 items
BAG SNAP BAND KOVER 36X36 (MISCELLANEOUS) ×2 IMPLANT
BALLN MUSTANG 7X80X75 (BALLOONS) ×2
BALLOON MUSTANG 7X80X75 (BALLOONS) IMPLANT
CATH ANGIO 5F BER2 65CM (CATHETERS) IMPLANT
GUIDEWIRE ANGLED .035X150CM (WIRE) IMPLANT
KIT MICROPUNCTURE NIT STIFF (SHEATH) IMPLANT
SHEATH PINNACLE R/O II 6F 4CM (SHEATH) IMPLANT
SYR MEDALLION 10ML (SYRINGE) IMPLANT
TRAY PV CATH (CUSTOM PROCEDURE TRAY) IMPLANT

## 2023-10-03 NOTE — Op Note (Signed)
Patient presents for concerns of difficult cannulation in his left Cimino which was placed by Dr. Edilia Bo on November 09, 2021.  Patient has had inflow 7 mm balloon angioplasty as well as 5 mm arterial anastomosis angioplasty last on December 18, 2022.Marland Kitchen    On the physical exam, the fistula is hyperpulsatile at the inflow.  Summary:  1)      The patient had successful angioplasty (7 mm Mustang FE ~18 atm) of significant multiple 50% stenosis in the inflow cephalic vein site. May tolerate 8 mm PTA in the future. 2)      Dual  upper arm drainage, arch, axillary vein, central veins and arterial anastomosis were patent; flows improved after outflow angioplasty. 3)      This left Cimino remains amenable to future percutaneous intervention as long as it remains patent at least 3 months.  Description of procedure: The arm was prepped and draped in the usual sterile fashion. The left upper arm Cimino fistula was cannulated (01027) with a 21G micropuncture needle directed in a retrograde direction in venous limb of the fistula. A guidewire was inserted and exchanged for a 5 French stylette and then to a 6 Fr sheath. Contrast [phone]) injection via the side port of the sheath was performed. The angiogram of the fistula (44034) showed a patent outflow cephalic, upper arm drainage, patent arch, axillary vein, central veins.  The angled Glidewire was advanced and manipulated until the tip of the wire was in the proximal radial artery.  Arteriogram revealed a patent radial artery + arterial anastomosis gnosis and a multiple inflow limb and body of cephalic 50% stenoses. A 7 mm Mustang angioplasty balloon was then inserted over the guidewire and positioned at the cephalic vein inflow at all levels of stenosis.   Venous angioplasty (74259) was carried out to 15 ATM with FULL effacement of the waist on the balloon at all cephalic vein lesion sites. The repeat angiogram showed 10% residual stenosis with no evidence of  extravasation or dissection and more importantly the entire fistula had a nice thrill now.    Hemostasis: A 3-0 ethilon purse string suture was placed at the cannulation site on removal of the sheath.  Sedation: 1 mg Versed, 50 mcg Fentanyl. Sedation time: 10 minutes  Contrast. 10 mL  Monitoring: Because of the patient's comorbid conditions and sedation during the procedure, continuous EKG monitoring and O2 saturation monitoring was performed throughout the procedure by the RN. There were no abnormal arrhythmias encountered.  Complications: None  Diagnoses: I87.1 Stricture of vein  N18.6 ESRD T82.858A Stricture of access  Procedure Coding:  [phone] Cannulation and angiogram of fistula, venous angioplasty (cephalic  vein inflow)  [id number] Contrast  Recommendations:  1. Continue to cannulate the fistula with 15G needles.  2. Refer for problems with flows/swelling. 3. Remove the suture next treatment.   Discharge: The patient was discharged home in stable condition. The patient was given education regarding the care of the dialysis access AVF and specific instructions in case of any problems.

## 2023-10-03 NOTE — H&P (Addendum)
Chief Complaint: Decreased flows  Interval H&P  The patient has presented today for an angiogram/ angioplasty; [facility] is followed [facility] with [facility]. Ronalee Belts.  Various methods of treatment have been discussed with the patient.  After consideration of risk, benefits and other options for treatment, the patient has consented to a angiogram/ angioplasty with  possible stent placement.   Risks of angiogram with potential angioplasty and stenting if needed.contrast reaction, extravasation/ bleeding, dissection, hypotension and death were explained to the patient.  The patient's history has been reviewed and the patient has been examined, no changes in status.  Stable for angiogram/angioplasty  I have reviewed the patient's chart and labs.  Questions were answered to the patient's satisfaction.  Assessment/Plan: ESRD dialyzing at [facility] regimen with last dialysis Monday Unable to access Cimino fistula- Cimino fistula on the left side was placed by Dr. Durwin Nora on November 09, 2021.  Planning on angiogram with possibly angioplasty as there is hyper pulsatility in the arterial limb with an obvious stenosis with an adequate filling for consistent cannulation of the body of the fistula.  Patient's last procedure was in inflow limb 7 mm balloon angioplasty as well as a 5 mm arterial anastomosis angioplasty complicated by arterial spasm December 18, 2022.  Renal osteodystrophy - continue binders per home regimen. Anemia - managed with ESA's and IV iron at dialysis center. HTN - resume home regimen. COPD   HPI: Allen Mathis is an 57 y.o. male with hypertension, COPD, diabetes, asthma, ESRD initiated on dialysis in January 2023.  Patient currently dialyzes at [facility] on Tuesday Thursday Saturdays with last dialysis on Monday according to the holiday schedule.  Patient is referred because they were not able to access the left Cimino fistula on Thursday.  ROS Per HPI.  Chemistry and CBC: Creatinine, Ser   Date/Time Value Ref Range Status  06/19/2022 11:08 AM 8.40 (H) 0.61 - 1.24 mg/dL Final  21/30/8657 84:69 AM 4.99 (H) 0.61 - 1.24 mg/dL Final  62/95/2841 32:44 AM 7.21 (H) 0.61 - 1.24 mg/dL Final  10/09/7251 66:44 AM 5.61 (H) 0.61 - 1.24 mg/dL Final    Comment:    DELTA CHECK NOTED  11/06/2021 03:08 AM 9.66 (H) 0.61 - 1.24 mg/dL Final  03/47/4259 56:38 AM 8.56 (H) 0.61 - 1.24 mg/dL Final  75/64/3329 51:88 AM 6.95 (H) 0.61 - 1.24 mg/dL Final  41/66/0630 16:01 AM 10.12 (H) 0.61 - 1.24 mg/dL Final  09/32/3557 32:20 AM 15.06 (H) 0.61 - 1.24 mg/dL Final  25/42/7062 37:62 PM 14.18 (H) 0.61 - 1.24 mg/dL Final   No results for input(s): "NA", "K", "CL", "CO2", "GLUCOSE", "BUN", "CREATININE", "CALCIUM", "PHOS" in the last 168 hours.  Invalid input(s): "ALB" No results for input(s): "WBC", "NEUTROABS", "HGB", "HCT", "MCV", "PLT" in the last 168 hours. Liver Function Tests: No results for input(s): "AST", "ALT", "ALKPHOS", "BILITOT", "PROT", "ALBUMIN" in the last 168 hours. No results for input(s): "LIPASE", "AMYLASE" in the last 168 hours. No results for input(s): "AMMONIA" in the last 168 hours. Cardiac Enzymes: No results for input(s): "CKTOTAL", "CKMB", "CKMBINDEX", "TROPONINI" in the last 168 hours. Iron Studies: No results for input(s): "IRON", "TIBC", "TRANSFERRIN", "FERRITIN" in the last 72 hours. PT/INR: @LABRCNTIP (inr:5)  Xrays/Other Studies: )No results found for this or any previous visit (from the past 48 hours). No results found.  PMH:   Past Medical History:  Diagnosis Date   Asthma    Chronic kidney disease    COPD (chronic obstructive pulmonary disease) (HCC)  Diabetes mellitus without complication (HCC)    Hypertension     PSH:   Past Surgical History:  Procedure Laterality Date   A/V FISTULAGRAM Left 06/19/2022   Procedure: A/V Fistulagram;  Surgeon: Victorino Sparrow, MD;  Location: [facility] INVASIVE CV LAB;  Service: Cardiovascular;  Laterality: Left;   AV FISTULA  PLACEMENT Left 11/09/2021   Procedure: LEFT ARM ARTERIOVENOUS (AV) FISTULA CREATION ;  Surgeon: Chuck Hint, MD;  Location: [facility] OR;  Service: Vascular;  Laterality: Left;   BIOPSY  11/08/2021   Procedure: BIOPSY;  Surgeon: Jeani Hawking, MD;  Location[facility] ENDOSCOPY;  Service: Endoscopy;;   COLONOSCOPY WITH PROPOFOL N/A 11/08/2021   Procedure: COLONOSCOPY WITH PROPOFOL;  Surgeon: Jeani Hawking, MD;  Location: [facility] ENDOSCOPY;  Service: Endoscopy;  Laterality: N/A;   ESOPHAGOGASTRODUODENOSCOPY (EGD) WITH PROPOFOL N/A 11/08/2021   Procedure: ESOPHAGOGASTRODUODENOSCOPY (EGD) WITH PROPOFOL;  Surgeon: Jeani Hawking, MD;  Location: [facility] ENDOSCOPY;  Service: Endoscopy;  Laterality: N/A;   IR FLUORO GUIDE CV LINE RIGHT  11/02/2021   POLYPECTOMY  11/08/2021   Procedure: POLYPECTOMY;  Surgeon: Jeani Hawking, MD;  Location: [facility] ENDOSCOPY;  Service: Endoscopy;;    Allergies: No Known Allergies  Medications:   Prior to Admission medications   Medication Sig Start Date End Date Taking? Authorizing Provider  aspirin 81 MG EC tablet Take 81 mg by mouth daily.   Yes [provider]  atorvastatin (LIPITOR) 20 MG tablet Take 1 tablet (20 mg total) by mouth daily. 11/09/21  Yes Ghimire, Werner Lean, MD  calcium carbonate (TUMS EX) 750 MG chewable tablet Chew 2 tablets by mouth daily as needed for heartburn.   Yes [provider]  Multiple Vitamins-Minerals (MULTIVITAMIN MEN 50+ PO) Take 1 tablet by mouth daily. One a day   Yes [provider]  Omega-3 Fatty Acids (FISH OIL OMEGA-3 PO) Take 1,000 mg by mouth daily.   Yes [provider]  pantoprazole (PROTONIX) 40 MG tablet Take 1 tablet (40 mg total) by mouth daily. 11/09/21  Yes Ghimire, Werner Lean, MD  sevelamer (RENAGEL) 800 MG tablet Take 1,600 mg by mouth 3 (three) times daily with meals. 06/17/22  Yes [provider]  vitamin B-12 (CYANOCOBALAMIN) 100 MCG tablet Take 1 tablet (100 mcg total) by mouth daily. Patient taking  differently: Take 1,000 mcg by mouth daily. 11/09/21  Yes Ghimire, Werner Lean, MD    Discontinued Meds:   Medications Discontinued During This Encounter  Medication Reason   cyanocobalamin (VITAMIN B12) 1000 MCG tablet Patient Preference   multivitamin (RENA-VIT) TABS tablet Patient Preference    Social History:  reports that he has quit smoking. His smoking use included cigarettes. His smokeless tobacco use includes snuff. He reports current alcohol use. He reports current drug use. Drug: Marijuana.  Family History:   Family History  Problem Relation Age of Onset   Diabetes Mother    Diabetes Father     Blood pressure (!) 137/93, pulse 63, resp. rate 14, height 5\' 5"  (1.651 m), weight 80.7 kg, SpO2 94%. General: Supine in bed, no distress Heart: RRR Lungs: Bilateral chest rise with no increased work of breathing Abdomen: soft, non distended Extremities: No edema in the lower extremities, warm and well perfused Dialysis Access: left-sided Cimino fistula hyper pulsatile at the inflow positive bruit        Marquel Pottenger, Len Blalock, MD 10/03/2023, 7:03 AM

## 2023-10-03 NOTE — Telephone Encounter (Signed)
Received fax request from Dr Crista Elliot for access intervention. Notification was made by triage for patient to be sent to the ED due to no availability at this time for a fistulogram. Fax has been scanned into media.

## 2023-10-03 NOTE — Discharge Instructions (Signed)

## 2023-10-04 DIAGNOSIS — N186 End stage renal disease: Secondary | ICD-10-CM | POA: Diagnosis not present

## 2023-10-04 DIAGNOSIS — Z992 Dependence on renal dialysis: Secondary | ICD-10-CM | POA: Diagnosis not present

## 2023-10-04 DIAGNOSIS — N2581 Secondary hyperparathyroidism of renal origin: Secondary | ICD-10-CM | POA: Diagnosis not present

## 2023-10-04 IMAGING — DX DG CHEST 1V PORT
1 series · 1 of 1 positions shown · non-contrast
Comparison: Portable exam 3923 hours without priors for comparison

CLINICAL DATA: Proteinuria, dyspnea, diabetes mellitus, smoker

EXAM:
PORTABLE CHEST 1 VIEW

[chest ap]
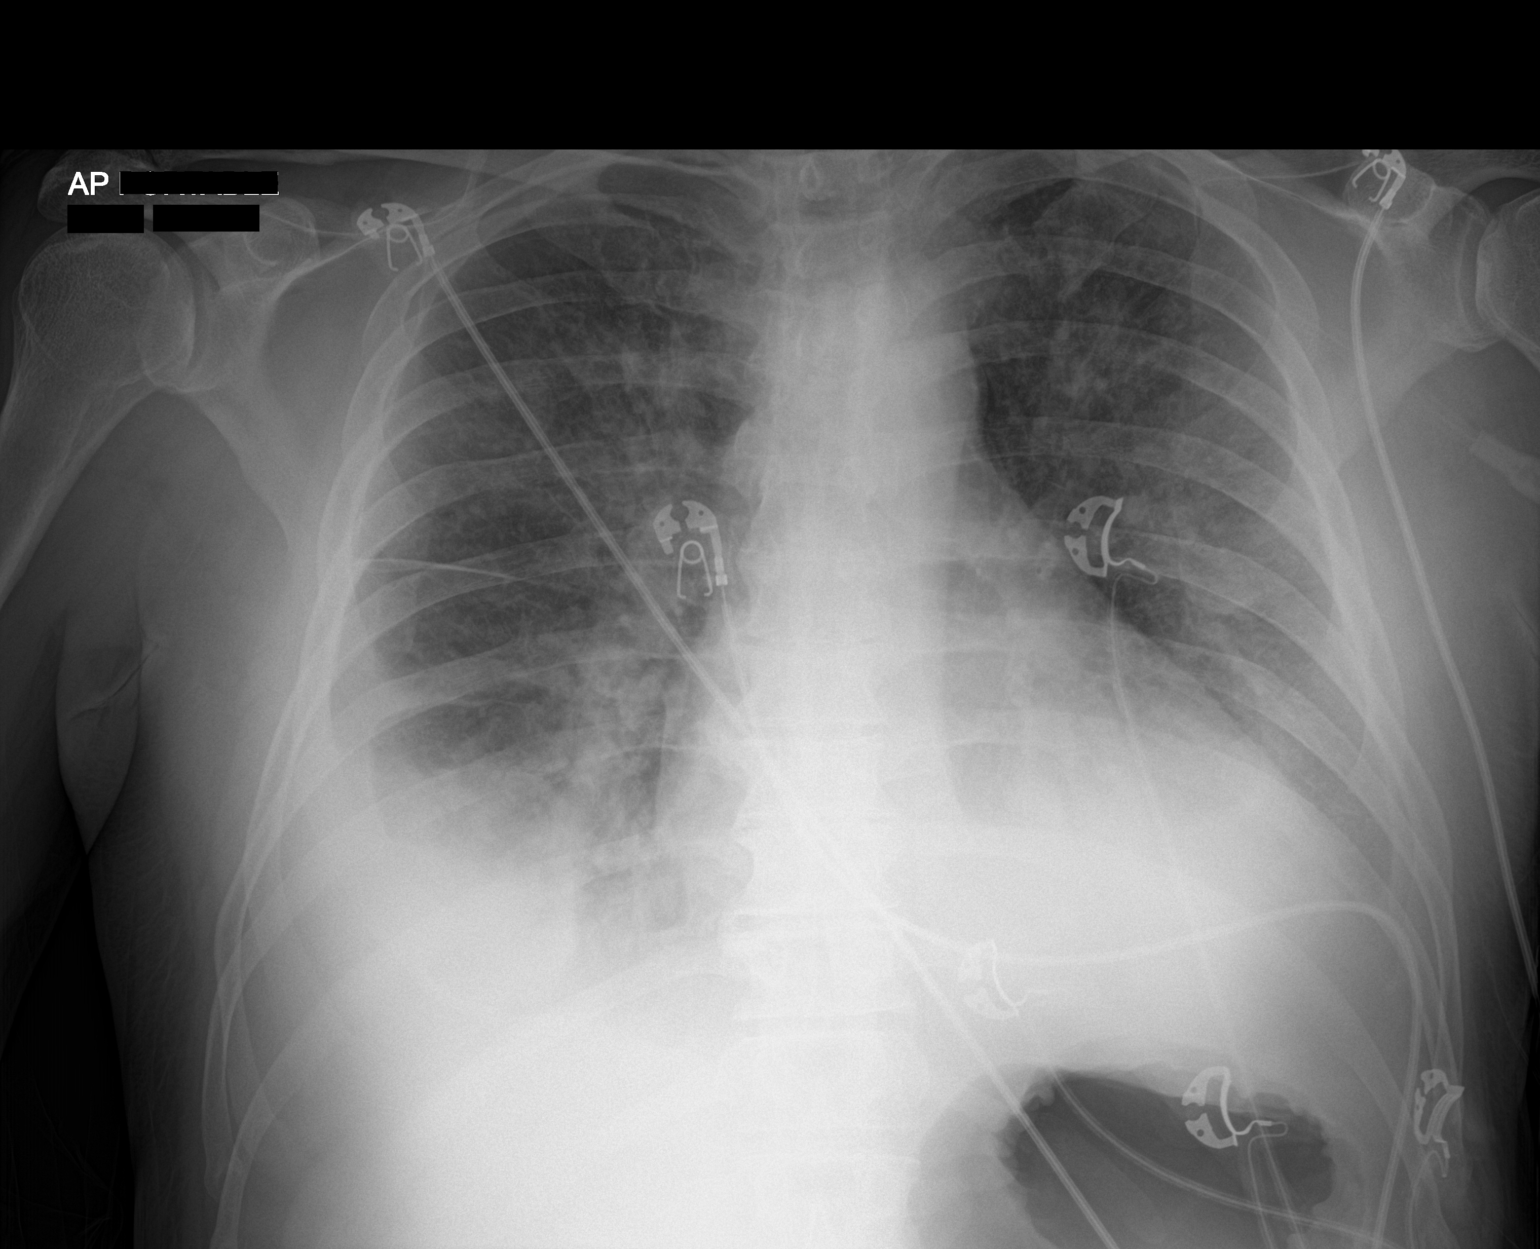

[1 of 1 positions shown; findings below may reference images not displayed]

FINDINGS: Enlargement of cardiac silhouette.

Mediastinal contours normal.

Patchy BILATERAL pulmonary infiltrates with bibasilar pleural
effusions and atelectasis.

No pneumothorax or acute osseous findings.
IMPRESSION: BILATERAL pulmonary infiltrates with bibasilar pleural effusions
atelectasis associated with cardiomegaly.

Findings favor pulmonary edema and CHF though infection is not
completely excluded.

## 2023-10-04 IMAGING — US US RENAL
1 series · 15 of 25 positions shown · non-contrast
Comparison: Ultrasound dated 02/23/2021.

CLINICAL DATA: Acute renal insufficiency.

EXAM:
RENAL / URINARY TRACT ULTRASOUND COMPLETE

[Series 1: us renal mc & wl · 15 of 43 slices shown]
[im 1/43]
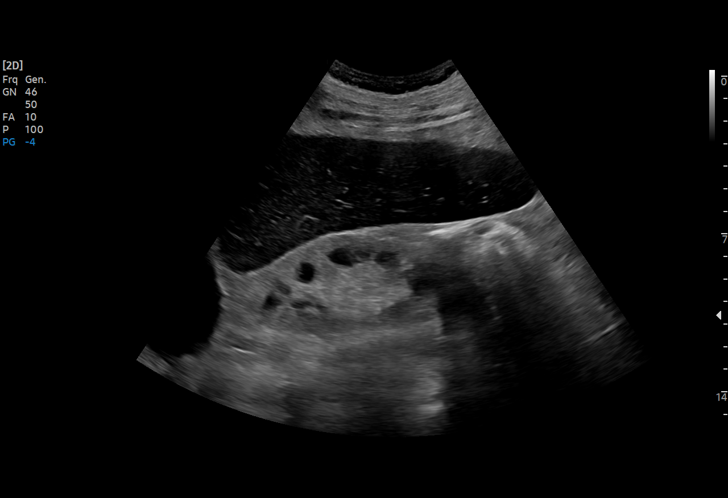
[im 4/43]
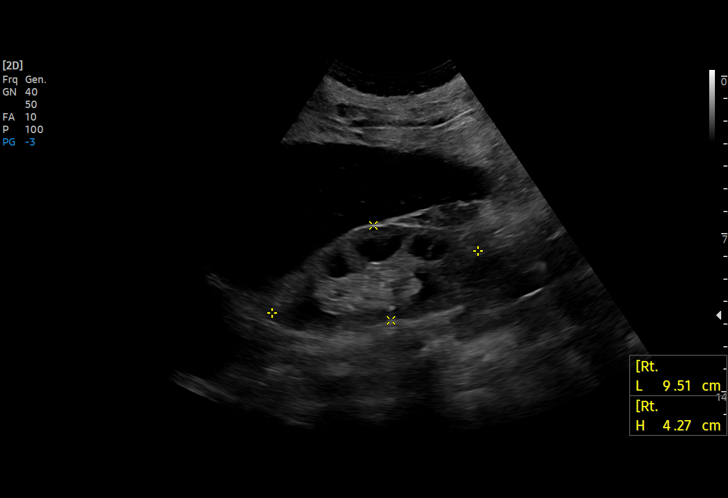
[im 8/43]
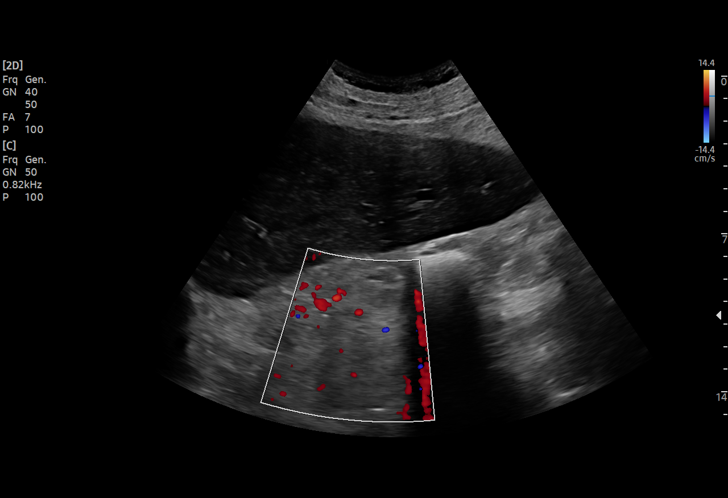
[im 9/43]
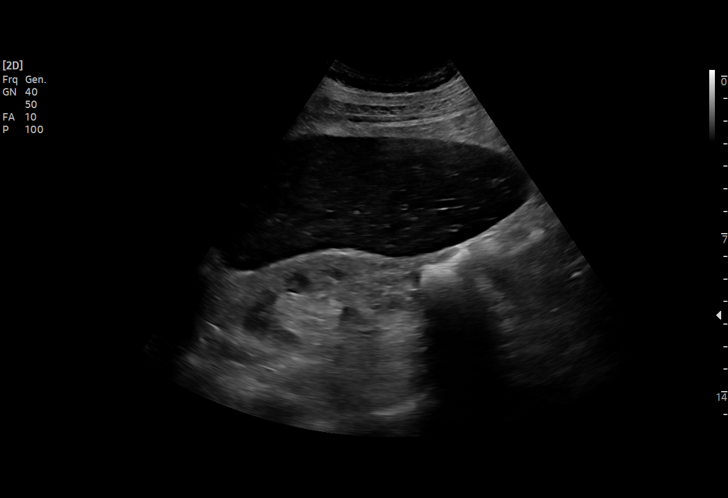
[im 13/43]
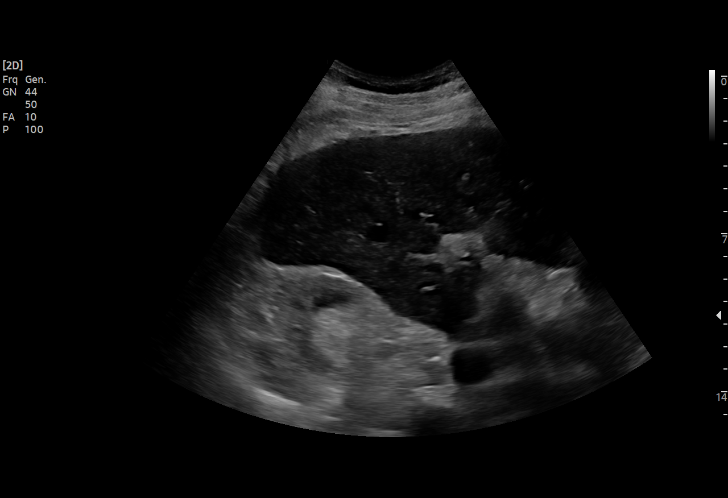
[im 16/43]
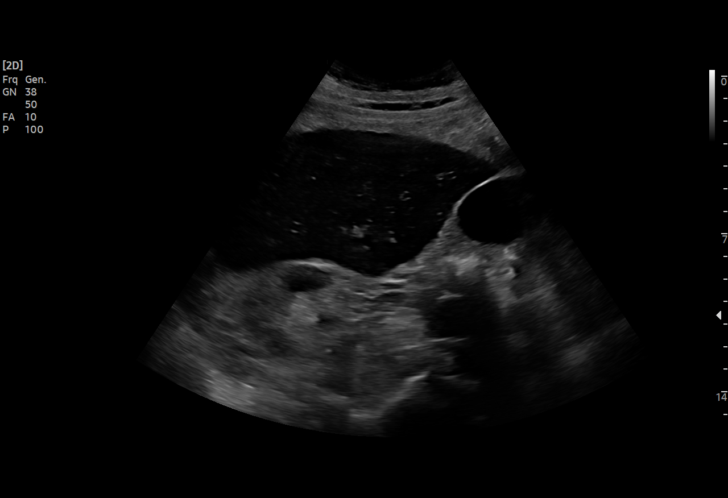
[im 18/43]
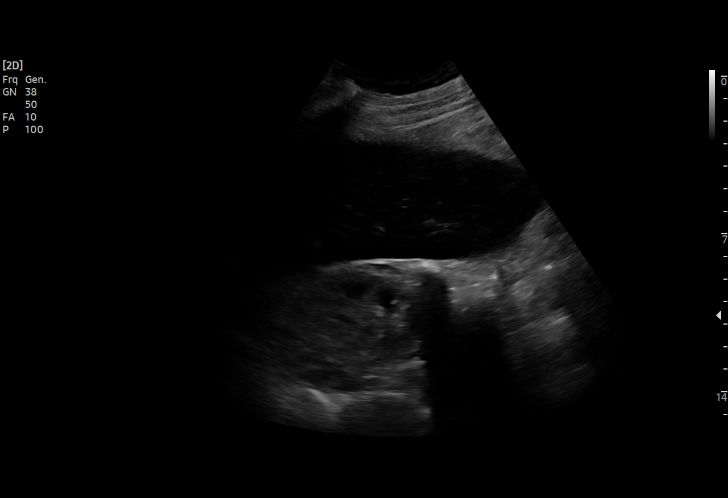
[im 22/43]
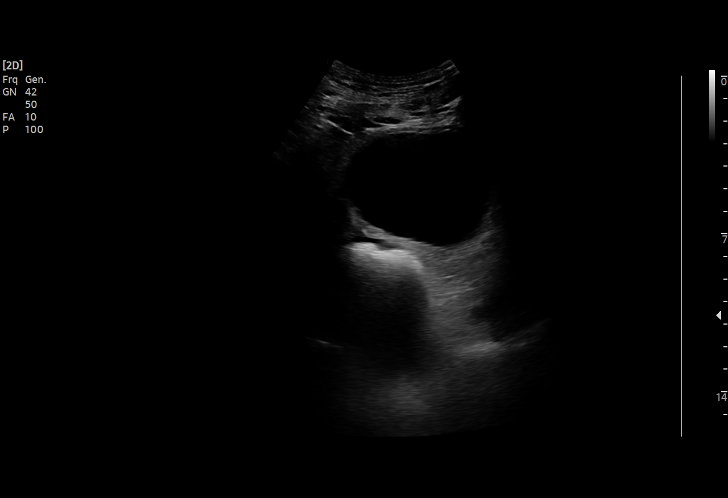
[im 25/43]
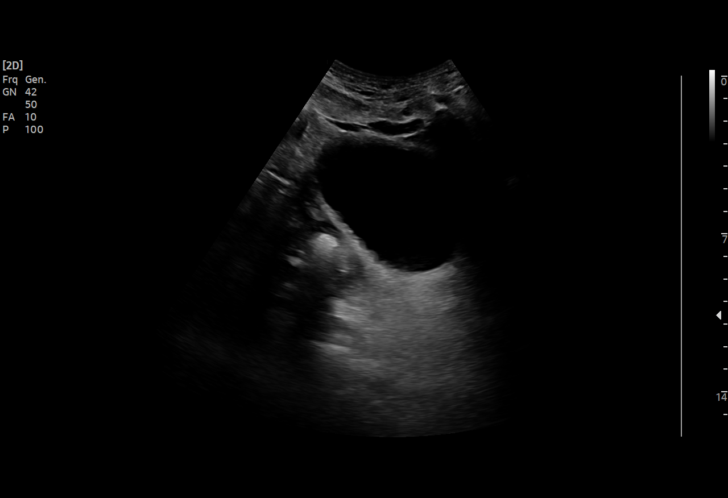
[im 27/43]
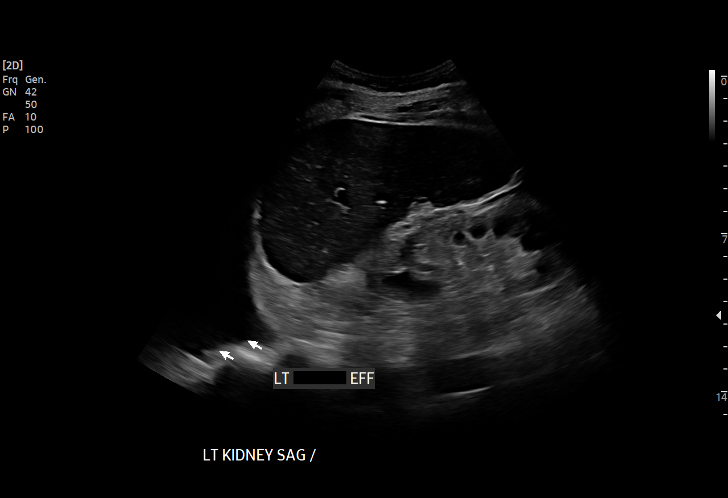
[im 30/43]
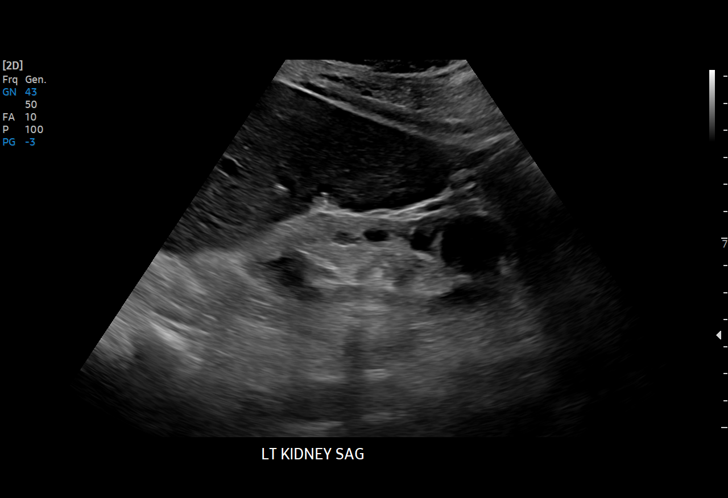
[im 34/43]
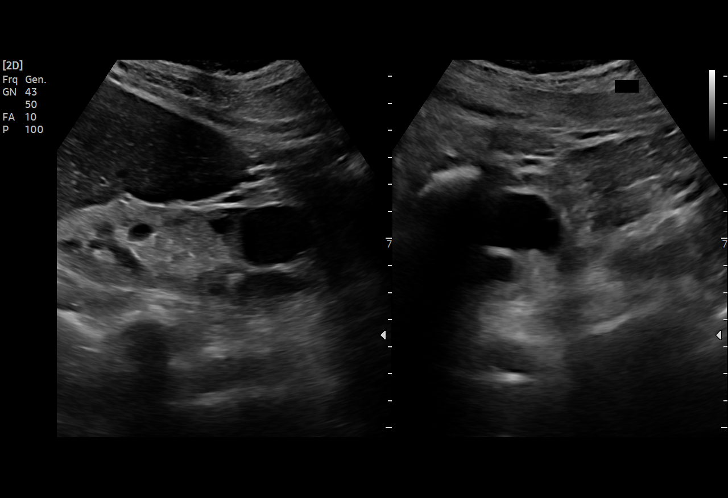
[im 36/43]
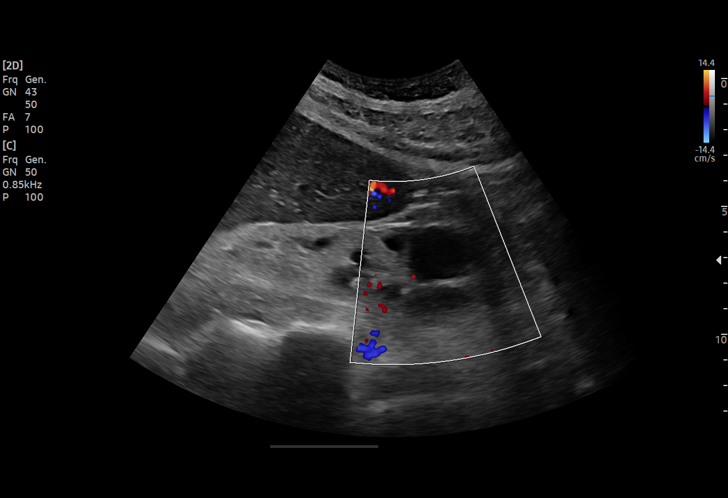
[im 39/43]
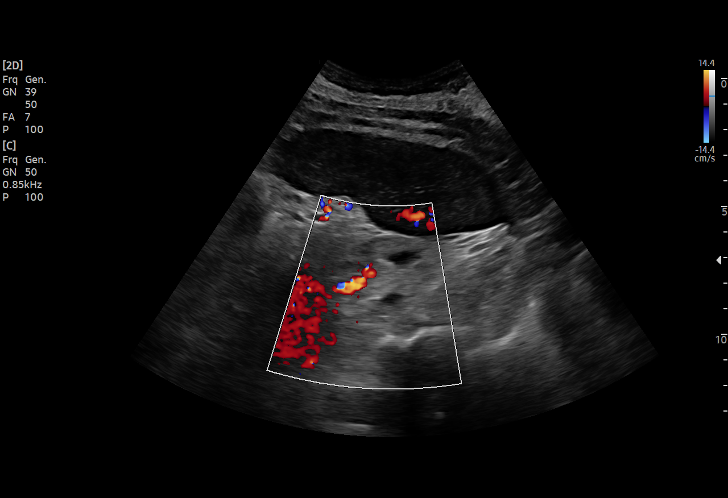
[im 43/43]
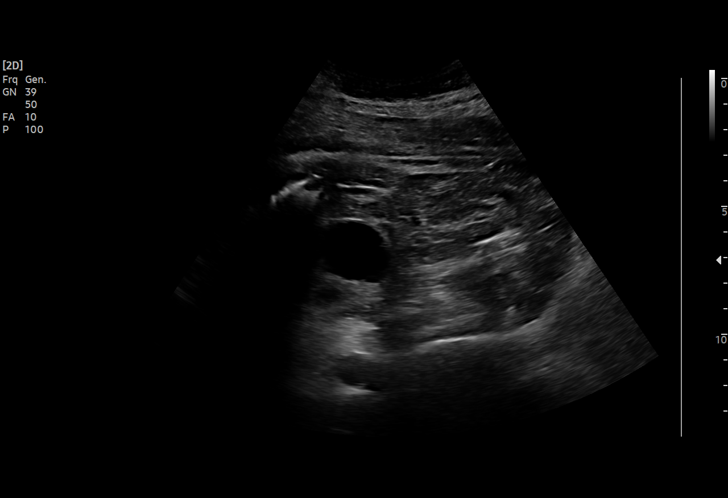

[15 of 25 positions shown; findings below may reference images not displayed]

FINDINGS: Right Kidney:

Renal measurements: 9.5 x 4.3 x 8.2 cm = volume: 89 mL. The right
kidney is atrophic and echogenic. No hydronephrosis or shadowing
stone.

Left Kidney:

Renal measurements: 9.8 x 3.4 x 4.3 cm = volume: 75 mL. The left
kidney is atrophic and echogenic. No hydronephrosis or shadowing
stone. There is a 2.5 cm inferior pole cyst.

Bladder:

Appears normal for degree of bladder distention.

Other:

None.
IMPRESSION: Echogenic and atrophic kidneys in keeping with chronic kidney
disease. No hydronephrosis or shadowing stone.

## 2023-10-05 IMAGING — XA IR FLUORO GUIDE CV LINE*R*
1 series · 1 of 1 positions shown · non-contrast
Comparison: none

INDICATION: 55-year-old with end-stage renal disease. Patient needs a catheter
for hemodialysis.

[Series 1: fl (-) angio · 1 of 1 slices shown]
[im 1/1]
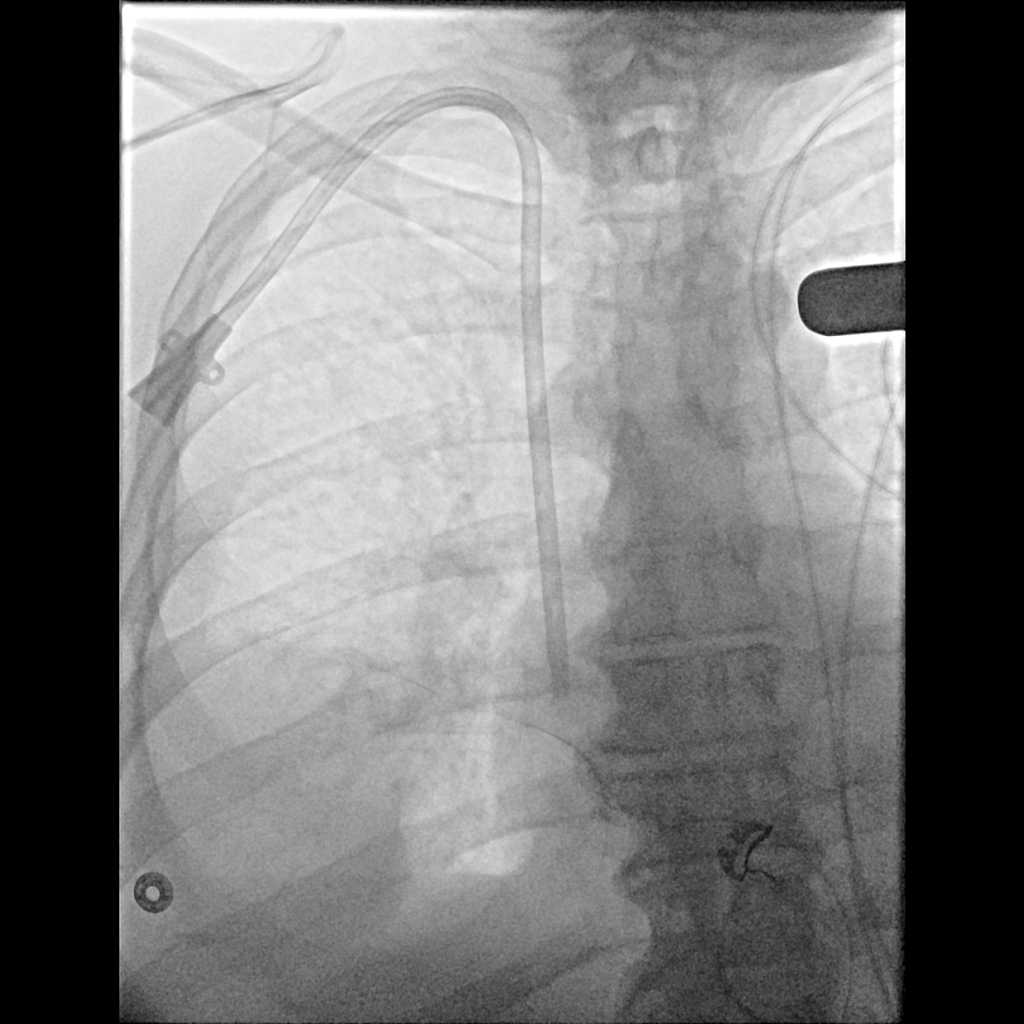

[1 of 1 positions shown; findings below may reference images not displayed]

EXAM:
FLUOROSCOPIC AND ULTRASOUND GUIDED PLACEMENT OF A TUNNELED DIALYSIS
CATHETER

MEDICATIONS:
Ancef 2 g; The antibiotic was administered within an appropriate
time interval prior to skin puncture.

ANESTHESIA/SEDATION:
Moderate (conscious) sedation was employed during this procedure. A
total of Versed 0.5mg and fentanyl 25 mcg was administered
intravenously at the order of the provider performing the procedure.

Total intra-service moderate sedation time: 22 minutes.

Patient's level of consciousness and vital signs were monitored
continuously by radiology nurse throughout the procedure under the
supervision of the provider performing the procedure.

FLUOROSCOPY TIME:  Fluoroscopy Time: 1 minute, 12 seconds, 8 mGy

COMPLICATIONS:
None immediate.

PROCEDURE:
The procedure was explained to the patient. The risks and benefits
of the procedure were discussed and the patient's questions were
addressed. Informed consent was obtained from the patient. The
patient was placed supine on the interventional table. Ultrasound
confirmed a patent right internal jugular vein. Ultrasound image
obtained for documentation. The right neck and chest was prepped and
draped in a sterile fashion. Maximal barrier sterile technique was
utilized including caps, mask, sterile gowns, sterile gloves,
sterile drape, hand hygiene and skin antiseptic. The right neck was
anesthetized with 1% lidocaine. A small incision was made with #11
blade scalpel. A 21 gauge needle directed into the right internal
jugular vein with ultrasound guidance. A micropuncture dilator set
was placed. A 19 cm tip to cuff Palindrome catheter was selected.
The skin below the right clavicle was anesthetized and a small
incision was made with an #11 blade scalpel. A subcutaneous tunnel
was formed to the vein dermatotomy site. The catheter was brought
through the tunnel. The vein dermatotomy site was dilated to
accommodate a peel-away sheath. The catheter was placed through the
peel-away sheath and directed into the central venous structures.
The tip of the catheter was placed at superior cavoatrial junction
with fluoroscopy. Fluoroscopic images were obtained for
documentation. Both lumens were found to aspirate and flush well.
The proper amount of heparin was flushed in both lumens. The vein
dermatotomy site was closed using a single layer of absorbable
suture and Dermabond. Gel-Foam was placed in the subcutaneous tract.
The catheter was secured to the skin using Prolene suture.
IMPRESSION: Successful placement of a right jugular tunneled dialysis catheter
using ultrasound and fluoroscopic guidance.

## 2023-10-06 ENCOUNTER — Encounter (HOSPITAL_COMMUNITY): Payer: Self-pay | Admitting: Nephrology

## 2023-10-06 DIAGNOSIS — N186 End stage renal disease: Secondary | ICD-10-CM | POA: Diagnosis not present

## 2023-10-06 DIAGNOSIS — Z992 Dependence on renal dialysis: Secondary | ICD-10-CM | POA: Diagnosis not present

## 2023-10-06 DIAGNOSIS — N2581 Secondary hyperparathyroidism of renal origin: Secondary | ICD-10-CM | POA: Diagnosis not present

## 2023-10-07 DIAGNOSIS — N186 End stage renal disease: Secondary | ICD-10-CM | POA: Diagnosis not present

## 2023-10-07 DIAGNOSIS — E1122 Type 2 diabetes mellitus with diabetic chronic kidney disease: Secondary | ICD-10-CM | POA: Diagnosis not present

## 2023-10-07 DIAGNOSIS — Z992 Dependence on renal dialysis: Secondary | ICD-10-CM | POA: Diagnosis not present

## 2023-10-09 DIAGNOSIS — Z992 Dependence on renal dialysis: Secondary | ICD-10-CM | POA: Diagnosis not present

## 2023-10-09 DIAGNOSIS — N2581 Secondary hyperparathyroidism of renal origin: Secondary | ICD-10-CM | POA: Diagnosis not present

## 2023-10-09 DIAGNOSIS — N186 End stage renal disease: Secondary | ICD-10-CM | POA: Diagnosis not present

## 2023-10-11 DIAGNOSIS — N2581 Secondary hyperparathyroidism of renal origin: Secondary | ICD-10-CM | POA: Diagnosis not present

## 2023-10-11 DIAGNOSIS — Z992 Dependence on renal dialysis: Secondary | ICD-10-CM | POA: Diagnosis not present

## 2023-10-11 DIAGNOSIS — N186 End stage renal disease: Secondary | ICD-10-CM | POA: Diagnosis not present

## 2023-10-14 DIAGNOSIS — N2581 Secondary hyperparathyroidism of renal origin: Secondary | ICD-10-CM | POA: Diagnosis not present

## 2023-10-14 DIAGNOSIS — Z992 Dependence on renal dialysis: Secondary | ICD-10-CM | POA: Diagnosis not present

## 2023-10-14 DIAGNOSIS — N186 End stage renal disease: Secondary | ICD-10-CM | POA: Diagnosis not present

## 2023-10-16 DIAGNOSIS — Z992 Dependence on renal dialysis: Secondary | ICD-10-CM | POA: Diagnosis not present

## 2023-10-16 DIAGNOSIS — N2581 Secondary hyperparathyroidism of renal origin: Secondary | ICD-10-CM | POA: Diagnosis not present

## 2023-10-16 DIAGNOSIS — N186 End stage renal disease: Secondary | ICD-10-CM | POA: Diagnosis not present

## 2023-10-18 DIAGNOSIS — Z992 Dependence on renal dialysis: Secondary | ICD-10-CM | POA: Diagnosis not present

## 2023-10-18 DIAGNOSIS — N2581 Secondary hyperparathyroidism of renal origin: Secondary | ICD-10-CM | POA: Diagnosis not present

## 2023-10-18 DIAGNOSIS — N186 End stage renal disease: Secondary | ICD-10-CM | POA: Diagnosis not present

## 2023-10-21 DIAGNOSIS — Z992 Dependence on renal dialysis: Secondary | ICD-10-CM | POA: Diagnosis not present

## 2023-10-21 DIAGNOSIS — N186 End stage renal disease: Secondary | ICD-10-CM | POA: Diagnosis not present

## 2023-10-21 DIAGNOSIS — N2581 Secondary hyperparathyroidism of renal origin: Secondary | ICD-10-CM | POA: Diagnosis not present

## 2023-10-23 DIAGNOSIS — N2581 Secondary hyperparathyroidism of renal origin: Secondary | ICD-10-CM | POA: Diagnosis not present

## 2023-10-23 DIAGNOSIS — N186 End stage renal disease: Secondary | ICD-10-CM | POA: Diagnosis not present

## 2023-10-23 DIAGNOSIS — Z992 Dependence on renal dialysis: Secondary | ICD-10-CM | POA: Diagnosis not present

## 2023-10-25 DIAGNOSIS — Z992 Dependence on renal dialysis: Secondary | ICD-10-CM | POA: Diagnosis not present

## 2023-10-25 DIAGNOSIS — N186 End stage renal disease: Secondary | ICD-10-CM | POA: Diagnosis not present

## 2023-10-25 DIAGNOSIS — N2581 Secondary hyperparathyroidism of renal origin: Secondary | ICD-10-CM | POA: Diagnosis not present

## 2023-10-28 DIAGNOSIS — N2581 Secondary hyperparathyroidism of renal origin: Secondary | ICD-10-CM | POA: Diagnosis not present

## 2023-10-28 DIAGNOSIS — N186 End stage renal disease: Secondary | ICD-10-CM | POA: Diagnosis not present

## 2023-10-28 DIAGNOSIS — Z992 Dependence on renal dialysis: Secondary | ICD-10-CM | POA: Diagnosis not present

## 2023-10-30 DIAGNOSIS — N2581 Secondary hyperparathyroidism of renal origin: Secondary | ICD-10-CM | POA: Diagnosis not present

## 2023-10-30 DIAGNOSIS — Z992 Dependence on renal dialysis: Secondary | ICD-10-CM | POA: Diagnosis not present

## 2023-10-30 DIAGNOSIS — N186 End stage renal disease: Secondary | ICD-10-CM | POA: Diagnosis not present

## 2023-11-01 DIAGNOSIS — N2581 Secondary hyperparathyroidism of renal origin: Secondary | ICD-10-CM | POA: Diagnosis not present

## 2023-11-01 DIAGNOSIS — N186 End stage renal disease: Secondary | ICD-10-CM | POA: Diagnosis not present

## 2023-11-01 DIAGNOSIS — Z992 Dependence on renal dialysis: Secondary | ICD-10-CM | POA: Diagnosis not present

## 2023-11-04 DIAGNOSIS — N2581 Secondary hyperparathyroidism of renal origin: Secondary | ICD-10-CM | POA: Diagnosis not present

## 2023-11-04 DIAGNOSIS — N186 End stage renal disease: Secondary | ICD-10-CM | POA: Diagnosis not present

## 2023-11-04 DIAGNOSIS — Z992 Dependence on renal dialysis: Secondary | ICD-10-CM | POA: Diagnosis not present

## 2023-11-06 DIAGNOSIS — Z992 Dependence on renal dialysis: Secondary | ICD-10-CM | POA: Diagnosis not present

## 2023-11-06 DIAGNOSIS — N2581 Secondary hyperparathyroidism of renal origin: Secondary | ICD-10-CM | POA: Diagnosis not present

## 2023-11-06 DIAGNOSIS — N186 End stage renal disease: Secondary | ICD-10-CM | POA: Diagnosis not present

## 2023-11-07 DIAGNOSIS — N186 End stage renal disease: Secondary | ICD-10-CM | POA: Diagnosis not present

## 2023-11-07 DIAGNOSIS — E1122 Type 2 diabetes mellitus with diabetic chronic kidney disease: Secondary | ICD-10-CM | POA: Diagnosis not present

## 2023-11-07 DIAGNOSIS — Z992 Dependence on renal dialysis: Secondary | ICD-10-CM | POA: Diagnosis not present

## 2023-11-08 DIAGNOSIS — N2581 Secondary hyperparathyroidism of renal origin: Secondary | ICD-10-CM | POA: Diagnosis not present

## 2023-11-08 DIAGNOSIS — Z992 Dependence on renal dialysis: Secondary | ICD-10-CM | POA: Diagnosis not present

## 2023-11-08 DIAGNOSIS — N186 End stage renal disease: Secondary | ICD-10-CM | POA: Diagnosis not present

## 2023-11-11 DIAGNOSIS — N2581 Secondary hyperparathyroidism of renal origin: Secondary | ICD-10-CM | POA: Diagnosis not present

## 2023-11-11 DIAGNOSIS — N186 End stage renal disease: Secondary | ICD-10-CM | POA: Diagnosis not present

## 2023-11-11 DIAGNOSIS — Z992 Dependence on renal dialysis: Secondary | ICD-10-CM | POA: Diagnosis not present

## 2023-11-13 DIAGNOSIS — N186 End stage renal disease: Secondary | ICD-10-CM | POA: Diagnosis not present

## 2023-11-13 DIAGNOSIS — N2581 Secondary hyperparathyroidism of renal origin: Secondary | ICD-10-CM | POA: Diagnosis not present

## 2023-11-13 DIAGNOSIS — Z992 Dependence on renal dialysis: Secondary | ICD-10-CM | POA: Diagnosis not present

## 2023-11-15 DIAGNOSIS — N186 End stage renal disease: Secondary | ICD-10-CM | POA: Diagnosis not present

## 2023-11-15 DIAGNOSIS — N2581 Secondary hyperparathyroidism of renal origin: Secondary | ICD-10-CM | POA: Diagnosis not present

## 2023-11-15 DIAGNOSIS — Z992 Dependence on renal dialysis: Secondary | ICD-10-CM | POA: Diagnosis not present

## 2023-11-18 DIAGNOSIS — Z992 Dependence on renal dialysis: Secondary | ICD-10-CM | POA: Diagnosis not present

## 2023-11-18 DIAGNOSIS — N2581 Secondary hyperparathyroidism of renal origin: Secondary | ICD-10-CM | POA: Diagnosis not present

## 2023-11-18 DIAGNOSIS — N186 End stage renal disease: Secondary | ICD-10-CM | POA: Diagnosis not present

## 2023-11-20 DIAGNOSIS — N186 End stage renal disease: Secondary | ICD-10-CM | POA: Diagnosis not present

## 2023-11-20 DIAGNOSIS — N2581 Secondary hyperparathyroidism of renal origin: Secondary | ICD-10-CM | POA: Diagnosis not present

## 2023-11-20 DIAGNOSIS — Z992 Dependence on renal dialysis: Secondary | ICD-10-CM | POA: Diagnosis not present

## 2023-11-22 DIAGNOSIS — N2581 Secondary hyperparathyroidism of renal origin: Secondary | ICD-10-CM | POA: Diagnosis not present

## 2023-11-22 DIAGNOSIS — Z992 Dependence on renal dialysis: Secondary | ICD-10-CM | POA: Diagnosis not present

## 2023-11-22 DIAGNOSIS — N186 End stage renal disease: Secondary | ICD-10-CM | POA: Diagnosis not present

## 2023-11-25 DIAGNOSIS — N186 End stage renal disease: Secondary | ICD-10-CM | POA: Diagnosis not present

## 2023-11-25 DIAGNOSIS — Z992 Dependence on renal dialysis: Secondary | ICD-10-CM | POA: Diagnosis not present

## 2023-11-25 DIAGNOSIS — N2581 Secondary hyperparathyroidism of renal origin: Secondary | ICD-10-CM | POA: Diagnosis not present

## 2023-11-27 DIAGNOSIS — N186 End stage renal disease: Secondary | ICD-10-CM | POA: Diagnosis not present

## 2023-11-27 DIAGNOSIS — Z992 Dependence on renal dialysis: Secondary | ICD-10-CM | POA: Diagnosis not present

## 2023-11-27 DIAGNOSIS — N2581 Secondary hyperparathyroidism of renal origin: Secondary | ICD-10-CM | POA: Diagnosis not present

## 2023-11-29 DIAGNOSIS — N2581 Secondary hyperparathyroidism of renal origin: Secondary | ICD-10-CM | POA: Diagnosis not present

## 2023-11-29 DIAGNOSIS — Z992 Dependence on renal dialysis: Secondary | ICD-10-CM | POA: Diagnosis not present

## 2023-11-29 DIAGNOSIS — N186 End stage renal disease: Secondary | ICD-10-CM | POA: Diagnosis not present

## 2023-12-02 DIAGNOSIS — Z992 Dependence on renal dialysis: Secondary | ICD-10-CM | POA: Diagnosis not present

## 2023-12-02 DIAGNOSIS — N186 End stage renal disease: Secondary | ICD-10-CM | POA: Diagnosis not present

## 2023-12-02 DIAGNOSIS — N2581 Secondary hyperparathyroidism of renal origin: Secondary | ICD-10-CM | POA: Diagnosis not present

## 2023-12-04 DIAGNOSIS — N186 End stage renal disease: Secondary | ICD-10-CM | POA: Diagnosis not present

## 2023-12-04 DIAGNOSIS — Z992 Dependence on renal dialysis: Secondary | ICD-10-CM | POA: Diagnosis not present

## 2023-12-04 DIAGNOSIS — N2581 Secondary hyperparathyroidism of renal origin: Secondary | ICD-10-CM | POA: Diagnosis not present

## 2023-12-05 DIAGNOSIS — N186 End stage renal disease: Secondary | ICD-10-CM | POA: Diagnosis not present

## 2023-12-05 DIAGNOSIS — Z992 Dependence on renal dialysis: Secondary | ICD-10-CM | POA: Diagnosis not present

## 2023-12-05 DIAGNOSIS — E1122 Type 2 diabetes mellitus with diabetic chronic kidney disease: Secondary | ICD-10-CM | POA: Diagnosis not present

## 2023-12-06 DIAGNOSIS — N2581 Secondary hyperparathyroidism of renal origin: Secondary | ICD-10-CM | POA: Diagnosis not present

## 2023-12-06 DIAGNOSIS — N186 End stage renal disease: Secondary | ICD-10-CM | POA: Diagnosis not present

## 2023-12-06 DIAGNOSIS — Z992 Dependence on renal dialysis: Secondary | ICD-10-CM | POA: Diagnosis not present

## 2023-12-09 DIAGNOSIS — N186 End stage renal disease: Secondary | ICD-10-CM | POA: Diagnosis not present

## 2023-12-09 DIAGNOSIS — N2581 Secondary hyperparathyroidism of renal origin: Secondary | ICD-10-CM | POA: Diagnosis not present

## 2023-12-09 DIAGNOSIS — Z992 Dependence on renal dialysis: Secondary | ICD-10-CM | POA: Diagnosis not present

## 2023-12-11 DIAGNOSIS — N2581 Secondary hyperparathyroidism of renal origin: Secondary | ICD-10-CM | POA: Diagnosis not present

## 2023-12-11 DIAGNOSIS — N186 End stage renal disease: Secondary | ICD-10-CM | POA: Diagnosis not present

## 2023-12-11 DIAGNOSIS — Z992 Dependence on renal dialysis: Secondary | ICD-10-CM | POA: Diagnosis not present

## 2023-12-13 DIAGNOSIS — Z992 Dependence on renal dialysis: Secondary | ICD-10-CM | POA: Diagnosis not present

## 2023-12-13 DIAGNOSIS — N186 End stage renal disease: Secondary | ICD-10-CM | POA: Diagnosis not present

## 2023-12-13 DIAGNOSIS — N2581 Secondary hyperparathyroidism of renal origin: Secondary | ICD-10-CM | POA: Diagnosis not present

## 2023-12-16 DIAGNOSIS — N186 End stage renal disease: Secondary | ICD-10-CM | POA: Diagnosis not present

## 2023-12-16 DIAGNOSIS — Z992 Dependence on renal dialysis: Secondary | ICD-10-CM | POA: Diagnosis not present

## 2023-12-16 DIAGNOSIS — N2581 Secondary hyperparathyroidism of renal origin: Secondary | ICD-10-CM | POA: Diagnosis not present

## 2023-12-18 DIAGNOSIS — Z992 Dependence on renal dialysis: Secondary | ICD-10-CM | POA: Diagnosis not present

## 2023-12-18 DIAGNOSIS — N2581 Secondary hyperparathyroidism of renal origin: Secondary | ICD-10-CM | POA: Diagnosis not present

## 2023-12-18 DIAGNOSIS — N186 End stage renal disease: Secondary | ICD-10-CM | POA: Diagnosis not present

## 2023-12-20 DIAGNOSIS — N186 End stage renal disease: Secondary | ICD-10-CM | POA: Diagnosis not present

## 2023-12-20 DIAGNOSIS — N2581 Secondary hyperparathyroidism of renal origin: Secondary | ICD-10-CM | POA: Diagnosis not present

## 2023-12-20 DIAGNOSIS — Z992 Dependence on renal dialysis: Secondary | ICD-10-CM | POA: Diagnosis not present

## 2023-12-23 DIAGNOSIS — Z992 Dependence on renal dialysis: Secondary | ICD-10-CM | POA: Diagnosis not present

## 2023-12-23 DIAGNOSIS — N186 End stage renal disease: Secondary | ICD-10-CM | POA: Diagnosis not present

## 2023-12-23 DIAGNOSIS — N2581 Secondary hyperparathyroidism of renal origin: Secondary | ICD-10-CM | POA: Diagnosis not present

## 2023-12-25 DIAGNOSIS — N2581 Secondary hyperparathyroidism of renal origin: Secondary | ICD-10-CM | POA: Diagnosis not present

## 2023-12-25 DIAGNOSIS — N186 End stage renal disease: Secondary | ICD-10-CM | POA: Diagnosis not present

## 2023-12-25 DIAGNOSIS — Z992 Dependence on renal dialysis: Secondary | ICD-10-CM | POA: Diagnosis not present

## 2023-12-27 DIAGNOSIS — N2581 Secondary hyperparathyroidism of renal origin: Secondary | ICD-10-CM | POA: Diagnosis not present

## 2023-12-27 DIAGNOSIS — N186 End stage renal disease: Secondary | ICD-10-CM | POA: Diagnosis not present

## 2023-12-27 DIAGNOSIS — Z992 Dependence on renal dialysis: Secondary | ICD-10-CM | POA: Diagnosis not present

## 2023-12-30 DIAGNOSIS — N186 End stage renal disease: Secondary | ICD-10-CM | POA: Diagnosis not present

## 2023-12-30 DIAGNOSIS — Z992 Dependence on renal dialysis: Secondary | ICD-10-CM | POA: Diagnosis not present

## 2023-12-30 DIAGNOSIS — N2581 Secondary hyperparathyroidism of renal origin: Secondary | ICD-10-CM | POA: Diagnosis not present

## 2024-01-01 DIAGNOSIS — N2581 Secondary hyperparathyroidism of renal origin: Secondary | ICD-10-CM | POA: Diagnosis not present

## 2024-01-01 DIAGNOSIS — Z992 Dependence on renal dialysis: Secondary | ICD-10-CM | POA: Diagnosis not present

## 2024-01-01 DIAGNOSIS — N186 End stage renal disease: Secondary | ICD-10-CM | POA: Diagnosis not present

## 2024-01-03 DIAGNOSIS — N2581 Secondary hyperparathyroidism of renal origin: Secondary | ICD-10-CM | POA: Diagnosis not present

## 2024-01-03 DIAGNOSIS — N186 End stage renal disease: Secondary | ICD-10-CM | POA: Diagnosis not present

## 2024-01-03 DIAGNOSIS — Z992 Dependence on renal dialysis: Secondary | ICD-10-CM | POA: Diagnosis not present

## 2024-01-05 DIAGNOSIS — Z992 Dependence on renal dialysis: Secondary | ICD-10-CM | POA: Diagnosis not present

## 2024-01-05 DIAGNOSIS — N186 End stage renal disease: Secondary | ICD-10-CM | POA: Diagnosis not present

## 2024-01-05 DIAGNOSIS — E1122 Type 2 diabetes mellitus with diabetic chronic kidney disease: Secondary | ICD-10-CM | POA: Diagnosis not present

## 2024-01-06 DIAGNOSIS — N2581 Secondary hyperparathyroidism of renal origin: Secondary | ICD-10-CM | POA: Diagnosis not present

## 2024-01-06 DIAGNOSIS — N186 End stage renal disease: Secondary | ICD-10-CM | POA: Diagnosis not present

## 2024-01-06 DIAGNOSIS — Z992 Dependence on renal dialysis: Secondary | ICD-10-CM | POA: Diagnosis not present

## 2024-01-08 DIAGNOSIS — N2581 Secondary hyperparathyroidism of renal origin: Secondary | ICD-10-CM | POA: Diagnosis not present

## 2024-01-08 DIAGNOSIS — Z992 Dependence on renal dialysis: Secondary | ICD-10-CM | POA: Diagnosis not present

## 2024-01-08 DIAGNOSIS — N186 End stage renal disease: Secondary | ICD-10-CM | POA: Diagnosis not present

## 2024-01-10 DIAGNOSIS — Z992 Dependence on renal dialysis: Secondary | ICD-10-CM | POA: Diagnosis not present

## 2024-01-10 DIAGNOSIS — N186 End stage renal disease: Secondary | ICD-10-CM | POA: Diagnosis not present

## 2024-01-10 DIAGNOSIS — N2581 Secondary hyperparathyroidism of renal origin: Secondary | ICD-10-CM | POA: Diagnosis not present

## 2024-01-13 DIAGNOSIS — Z992 Dependence on renal dialysis: Secondary | ICD-10-CM | POA: Diagnosis not present

## 2024-01-13 DIAGNOSIS — N2581 Secondary hyperparathyroidism of renal origin: Secondary | ICD-10-CM | POA: Diagnosis not present

## 2024-01-13 DIAGNOSIS — N186 End stage renal disease: Secondary | ICD-10-CM | POA: Diagnosis not present

## 2024-01-15 DIAGNOSIS — N186 End stage renal disease: Secondary | ICD-10-CM | POA: Diagnosis not present

## 2024-01-15 DIAGNOSIS — N2581 Secondary hyperparathyroidism of renal origin: Secondary | ICD-10-CM | POA: Diagnosis not present

## 2024-01-15 DIAGNOSIS — Z992 Dependence on renal dialysis: Secondary | ICD-10-CM | POA: Diagnosis not present

## 2024-01-17 DIAGNOSIS — N2581 Secondary hyperparathyroidism of renal origin: Secondary | ICD-10-CM | POA: Diagnosis not present

## 2024-01-17 DIAGNOSIS — Z992 Dependence on renal dialysis: Secondary | ICD-10-CM | POA: Diagnosis not present

## 2024-01-17 DIAGNOSIS — N186 End stage renal disease: Secondary | ICD-10-CM | POA: Diagnosis not present

## 2024-01-20 DIAGNOSIS — N186 End stage renal disease: Secondary | ICD-10-CM | POA: Diagnosis not present

## 2024-01-20 DIAGNOSIS — Z992 Dependence on renal dialysis: Secondary | ICD-10-CM | POA: Diagnosis not present

## 2024-01-20 DIAGNOSIS — N2581 Secondary hyperparathyroidism of renal origin: Secondary | ICD-10-CM | POA: Diagnosis not present

## 2024-01-22 DIAGNOSIS — Z992 Dependence on renal dialysis: Secondary | ICD-10-CM | POA: Diagnosis not present

## 2024-01-22 DIAGNOSIS — N2581 Secondary hyperparathyroidism of renal origin: Secondary | ICD-10-CM | POA: Diagnosis not present

## 2024-01-22 DIAGNOSIS — N186 End stage renal disease: Secondary | ICD-10-CM | POA: Diagnosis not present

## 2024-01-24 DIAGNOSIS — Z992 Dependence on renal dialysis: Secondary | ICD-10-CM | POA: Diagnosis not present

## 2024-01-24 DIAGNOSIS — N186 End stage renal disease: Secondary | ICD-10-CM | POA: Diagnosis not present

## 2024-01-24 DIAGNOSIS — N2581 Secondary hyperparathyroidism of renal origin: Secondary | ICD-10-CM | POA: Diagnosis not present

## 2024-01-27 DIAGNOSIS — N2581 Secondary hyperparathyroidism of renal origin: Secondary | ICD-10-CM | POA: Diagnosis not present

## 2024-01-27 DIAGNOSIS — Z992 Dependence on renal dialysis: Secondary | ICD-10-CM | POA: Diagnosis not present

## 2024-01-27 DIAGNOSIS — N186 End stage renal disease: Secondary | ICD-10-CM | POA: Diagnosis not present

## 2024-01-29 DIAGNOSIS — N2581 Secondary hyperparathyroidism of renal origin: Secondary | ICD-10-CM | POA: Diagnosis not present

## 2024-01-29 DIAGNOSIS — N186 End stage renal disease: Secondary | ICD-10-CM | POA: Diagnosis not present

## 2024-01-29 DIAGNOSIS — Z992 Dependence on renal dialysis: Secondary | ICD-10-CM | POA: Diagnosis not present

## 2024-01-31 DIAGNOSIS — N186 End stage renal disease: Secondary | ICD-10-CM | POA: Diagnosis not present

## 2024-01-31 DIAGNOSIS — Z992 Dependence on renal dialysis: Secondary | ICD-10-CM | POA: Diagnosis not present

## 2024-01-31 DIAGNOSIS — N2581 Secondary hyperparathyroidism of renal origin: Secondary | ICD-10-CM | POA: Diagnosis not present

## 2024-02-03 DIAGNOSIS — N2581 Secondary hyperparathyroidism of renal origin: Secondary | ICD-10-CM | POA: Diagnosis not present

## 2024-02-03 DIAGNOSIS — N186 End stage renal disease: Secondary | ICD-10-CM | POA: Diagnosis not present

## 2024-02-03 DIAGNOSIS — Z992 Dependence on renal dialysis: Secondary | ICD-10-CM | POA: Diagnosis not present

## 2024-02-04 DIAGNOSIS — N186 End stage renal disease: Secondary | ICD-10-CM | POA: Diagnosis not present

## 2024-02-04 DIAGNOSIS — E1122 Type 2 diabetes mellitus with diabetic chronic kidney disease: Secondary | ICD-10-CM | POA: Diagnosis not present

## 2024-02-04 DIAGNOSIS — Z992 Dependence on renal dialysis: Secondary | ICD-10-CM | POA: Diagnosis not present

## 2024-02-05 DIAGNOSIS — N2581 Secondary hyperparathyroidism of renal origin: Secondary | ICD-10-CM | POA: Diagnosis not present

## 2024-02-05 DIAGNOSIS — Z992 Dependence on renal dialysis: Secondary | ICD-10-CM | POA: Diagnosis not present

## 2024-02-05 DIAGNOSIS — N186 End stage renal disease: Secondary | ICD-10-CM | POA: Diagnosis not present

## 2024-02-07 DIAGNOSIS — N186 End stage renal disease: Secondary | ICD-10-CM | POA: Diagnosis not present

## 2024-02-07 DIAGNOSIS — Z992 Dependence on renal dialysis: Secondary | ICD-10-CM | POA: Diagnosis not present

## 2024-02-07 DIAGNOSIS — N2581 Secondary hyperparathyroidism of renal origin: Secondary | ICD-10-CM | POA: Diagnosis not present

## 2024-02-10 DIAGNOSIS — Z992 Dependence on renal dialysis: Secondary | ICD-10-CM | POA: Diagnosis not present

## 2024-02-10 DIAGNOSIS — N2581 Secondary hyperparathyroidism of renal origin: Secondary | ICD-10-CM | POA: Diagnosis not present

## 2024-02-10 DIAGNOSIS — N186 End stage renal disease: Secondary | ICD-10-CM | POA: Diagnosis not present

## 2024-02-12 DIAGNOSIS — N186 End stage renal disease: Secondary | ICD-10-CM | POA: Diagnosis not present

## 2024-02-12 DIAGNOSIS — Z992 Dependence on renal dialysis: Secondary | ICD-10-CM | POA: Diagnosis not present

## 2024-02-12 DIAGNOSIS — N2581 Secondary hyperparathyroidism of renal origin: Secondary | ICD-10-CM | POA: Diagnosis not present

## 2024-02-14 DIAGNOSIS — N186 End stage renal disease: Secondary | ICD-10-CM | POA: Diagnosis not present

## 2024-02-14 DIAGNOSIS — Z992 Dependence on renal dialysis: Secondary | ICD-10-CM | POA: Diagnosis not present

## 2024-02-14 DIAGNOSIS — N2581 Secondary hyperparathyroidism of renal origin: Secondary | ICD-10-CM | POA: Diagnosis not present

## 2024-02-17 DIAGNOSIS — N186 End stage renal disease: Secondary | ICD-10-CM | POA: Diagnosis not present

## 2024-02-17 DIAGNOSIS — N2581 Secondary hyperparathyroidism of renal origin: Secondary | ICD-10-CM | POA: Diagnosis not present

## 2024-02-17 DIAGNOSIS — Z992 Dependence on renal dialysis: Secondary | ICD-10-CM | POA: Diagnosis not present

## 2024-02-19 DIAGNOSIS — N186 End stage renal disease: Secondary | ICD-10-CM | POA: Diagnosis not present

## 2024-02-19 DIAGNOSIS — N2581 Secondary hyperparathyroidism of renal origin: Secondary | ICD-10-CM | POA: Diagnosis not present

## 2024-02-19 DIAGNOSIS — Z992 Dependence on renal dialysis: Secondary | ICD-10-CM | POA: Diagnosis not present

## 2024-02-21 DIAGNOSIS — N2581 Secondary hyperparathyroidism of renal origin: Secondary | ICD-10-CM | POA: Diagnosis not present

## 2024-02-21 DIAGNOSIS — N186 End stage renal disease: Secondary | ICD-10-CM | POA: Diagnosis not present

## 2024-02-21 DIAGNOSIS — Z992 Dependence on renal dialysis: Secondary | ICD-10-CM | POA: Diagnosis not present

## 2024-02-23 ENCOUNTER — Ambulatory Visit (HOSPITAL_COMMUNITY)
Admission: RE | Admit: 2024-02-23 | Discharge: 2024-02-23 | Disposition: A | Discharge status: Home / Self Care | Attending: Vascular Surgery | Admitting: Vascular Surgery

## 2024-02-23 ENCOUNTER — Other Ambulatory Visit: Payer: Self-pay

## 2024-02-23 ENCOUNTER — Encounter (HOSPITAL_COMMUNITY)
Admission: RE | Disposition: A | Discharge status: Home / Self Care | Payer: Self-pay | Source: Home / Self Care | Attending: Vascular Surgery

## 2024-02-23 DIAGNOSIS — I120 Hypertensive chronic kidney disease with stage 5 chronic kidney disease or end stage renal disease: Secondary | ICD-10-CM | POA: Diagnosis not present

## 2024-02-23 DIAGNOSIS — N186 End stage renal disease: Secondary | ICD-10-CM | POA: Diagnosis not present

## 2024-02-23 DIAGNOSIS — E1122 Type 2 diabetes mellitus with diabetic chronic kidney disease: Secondary | ICD-10-CM | POA: Diagnosis not present

## 2024-02-23 DIAGNOSIS — Z992 Dependence on renal dialysis: Secondary | ICD-10-CM | POA: Diagnosis not present

## 2024-02-23 DIAGNOSIS — T82898A Other specified complication of vascular prosthetic devices, implants and grafts, initial encounter: Secondary | ICD-10-CM | POA: Diagnosis not present

## 2024-02-23 HISTORY — PX: A/V SHUNT INTERVENTION: CATH118220

## 2024-02-23 HISTORY — PX: VENOUS ANGIOPLASTY: CATH118376

## 2024-02-23 SURGERY — A/V SHUNT INTERVENTION
Anesthesia: LOCAL

## 2024-02-23 MED ORDER — HEPARIN (PORCINE) IN NACL 1000-0.9 UT/500ML-% IV SOLN
INTRAVENOUS | Status: DC | PRN
  Administered 2024-02-23: 500 mL

## 2024-02-23 MED ORDER — LIDOCAINE HCL (PF) 1 % IJ SOLN
INTRAMUSCULAR | Status: AC
  Filled 2024-02-23: qty 30

## 2024-02-23 MED ORDER — LIDOCAINE HCL (PF) 1 % IJ SOLN
INTRAMUSCULAR | Status: DC | PRN
  Administered 2024-02-23: 5 mL via SUBCUTANEOUS

## 2024-02-23 MED ORDER — IODIXANOL 320 MG/ML IV SOLN
INTRAVENOUS | Status: DC | PRN
  Administered 2024-02-23: 12 mL via INTRAVENOUS

## 2024-02-23 SURGICAL SUPPLY — 9 items
BALLOON MUSTANG 7X80X75 (BALLOONS) IMPLANT
KIT ENCORE 40 (KITS) IMPLANT
KIT MICROPUNCTURE NIT STIFF (SHEATH) IMPLANT
SHEATH PINNACLE R/O II 6F 4CM (SHEATH) IMPLANT
SHEATH PROBE COVER 6X72 (BAG) IMPLANT
STOPCOCK MORSE 400PSI 3WAY (MISCELLANEOUS) IMPLANT
TRAY PV CATH (CUSTOM PROCEDURE TRAY) IMPLANT
TUBE CONN 8.8X1320 FR HP M-F (CONNECTOR) IMPLANT
WIRE BENTSON .035X145CM (WIRE) IMPLANT

## 2024-02-23 NOTE — Op Note (Signed)
 DATE OF SERVICE: 02/23/2024  PATIENT:  Allen Mathis  58 y.o. male  PRE-OPERATIVE DIAGNOSIS:  ESRD, poorly functioning AVF  POST-OPERATIVE DIAGNOSIS:  Same  PROCEDURE:   1) ultrasound-guided left arteriovenous fistula access 2) left upper extremity fistulogram 3) left upper extremity fistula angioplasty (7 x 80 mm Mustang)  SURGEON:  Surgeons and Role:    * Carlene Che, MD - Primary  ASSISTANT: none  ANESTHESIA:   local  EBL: minimal  BLOOD ADMINISTERED:none  DRAINS: none   LOCAL MEDICATIONS USED:  LIDOCAINE    SPECIMEN:  none  COUNTS: confirmed correct.  TOURNIQUET:  none  PATIENT DISPOSITION:  PACU - hemodynamically stable.   Delay start of Pharmacological VTE agent (>24hrs) due to surgical blood loss or risk of bleeding: no  INDICATION FOR PROCEDURE: Allen Mathis is a 58 y.o. male with ESRD dialyzing through a left forearm radial basilic arteriovenous fistula.  The fistula has begun to perform poorly at dialysis.  After careful discussion of risks, benefits, and alternatives the patient was offered fistulogram. The patient understood and wished to proceed.  OPERATIVE FINDINGS:  No central venous stenosis No subclavian vein stenosis on the left No left axillary vein stenosis No cephalic or brachial vein stenosis Significant stenosis (greater than 70%) in area of chronic cannulation in the mid forearm.  DESCRIPTION OF PROCEDURE: After identification of the patient in the pre-operative holding area, the patient was transferred to the operating room. The patient was positioned supine on the operating room table. Anesthesia was induced. The left wrist was prepped and draped in standard fashion. A surgical pause was performed confirming correct patient, procedure, and operative location.  Ultrasound guidance was used to obtain micropuncture access in the left wrist forearm near the arterial anastomosis.  Using Seldinger technique, micro sheath was introduced  into the fistula.  Fistulogram was performed in stations moving centrally to peripherally.  Significant stenosis was seen in the fistula in the forearm.  A Bentson wire was used to cross the lesion.  6 [place] access was introduced via Occupational hygienist.  Angioplasty was performed of the lesion using a 7 x 80 mm Mustang balloon.  Follow-up fistulogram showed significant improvement in flow through the fistula.  All endovascular equipment was removed.  A pursestring suture was applied around the access site.  Upon completion of the case instrument and sharps counts were confirmed correct. The patient was transferred to the PACU in good condition. I was present for all portions of the procedure.  FOLLOW UP PLAN: Assuming a normal postoperative course, VVS PA will see the patient in 4 weeks with AV fistula duplex.   [name]. [name], MD [facility] Phone Number: [phone] 02/23/2024 11:46 AM

## 2024-02-23 NOTE — H&P (Signed)
 [facility] [place]  ASSESSMENT / PLAN: 58 y.o. male with poorly functioning left forearm Cimino fistula. Patient reports low flows. Plan fistulagram in the cath lab today.   CHIEF COMPLAINT: poorly functioning fistula  HISTORY OF PRESENT ILLNESS: Allen Mathis is a 59 y.o. male with poorly functioning left arm Cimino type AV fistula.  He has undergone multiple endovascular interventions for this in the past.  The fistula is working well as long as certain techs access the fistula.  His nephrologist is reporting low flow rates to him.   Past Medical History:  Diagnosis Date   Asthma    Chronic kidney disease    COPD (chronic obstructive pulmonary disease) (HCC)    Diabetes mellitus without complication (HCC)    Hypertension     Past Surgical History:  Procedure Laterality Date   A/V FISTULAGRAM Left 06/19/2022   Procedure: A/V Fistulagram;  Surgeon: Kayla Part, MD;  Location: [facility] INVASIVE CV LAB;  Service: Cardiovascular;  Laterality: Left;   A/V FISTULAGRAM N/A 10/03/2023   Procedure: A/V Fistulagram;  Surgeon: Patrick Boor, MD;  Location: [facility] INVASIVE CV LAB;  Service: Cardiovascular;  Laterality: N/A;   AV FISTULA PLACEMENT Left 11/09/2021   Procedure: LEFT ARM ARTERIOVENOUS (AV) FISTULA CREATION ;  Surgeon: Dannis Dy, MD;  Location: [facility] OR;  Service: Vascular;  Laterality: Left;   BIOPSY  11/08/2021   Procedure: BIOPSY;  Surgeon: Alvis Jourdain, MD;  Location: [facility] ENDOSCOPY;  Service: Endoscopy;;   COLONOSCOPY WITH PROPOFOL  N/A 11/08/2021   Procedure: COLONOSCOPY WITH PROPOFOL ;  Surgeon: Alvis Jourdain, MD;  Location: [facility] ENDOSCOPY;  Service: Endoscopy;  Laterality: N/A;   ESOPHAGOGASTRODUODENOSCOPY (EGD) WITH PROPOFOL  N/A 11/08/2021   Procedure: ESOPHAGOGASTRODUODENOSCOPY (EGD) WITH PROPOFOL ;  Surgeon: Alvis Jourdain, MD;  Location: [facility] ENDOSCOPY;  Service: Endoscopy;  Laterality: N/A;   IR FLUORO GUIDE CV LINE RIGHT  11/02/2021   PERIPHERAL VASCULAR  BALLOON ANGIOPLASTY  10/03/2023   Procedure: PERIPHERAL VASCULAR BALLOON ANGIOPLASTY;  Surgeon: Patrick Boor, MD;  Location: [facility] INVASIVE CV LAB;  Service: Cardiovascular;;   POLYPECTOMY  11/08/2021   Procedure: POLYPECTOMY;  Surgeon: Alvis Jourdain, MD;  Location: [facility] ENDOSCOPY;  Service: Endoscopy;;    Family History  Problem Relation Age of Onset   Diabetes Mother    Diabetes Father     Social History   Socioeconomic History   Marital status: Single    Spouse name: Not on file   Number of children: Not on file   Years of education: Not on file   Highest education level: Not on file  Occupational History   Not on file  Tobacco Use   Smoking status: Former    Types: Cigarettes   Smokeless tobacco: Current    Types: Snuff  Vaping Use   Vaping status: Never Used  Substance and Sexual Activity   Alcohol use: Yes   Drug use: Yes    Types: Marijuana   Sexual activity: Not on file  Other Topics Concern   Not on file  Social History Narrative   Not on file   Social Drivers of Health   Financial Resource Strain: Not on file  Food Insecurity: Not on file  Transportation Needs: Not on file  Physical Activity: Not on file  Stress: Not on file  Social Connections: Not on file  Intimate Partner Violence: Not on file    No Known Allergies  No current facility-administered medications for this encounter.    PHYSICAL EXAM Vitals:  02/23/24 1023  BP: (!) 148/79  Pulse: 64  Resp: 14  SpO2: 97%   Well-appearing man in no distress Regular rate and rhythm Unlabored breathing Left arm forearm fistula with both areas of chronic cannulation.  There is a thrill in the fistula.   PERTINENT LABORATORY AND RADIOLOGIC DATA  Most recent CBC    Latest Ref Rng & Units 06/19/2022   11:08 AM 11/07/2021    2:29 AM 11/06/2021    3:08 AM  CBC  WBC 4.0 - 10.5 K/uL  7.7  7.8   Hemoglobin 13.0 - 17.0 g/dL 78.2  9.4  9.0   Hematocrit 39.0 - 52.0 % 38.0  27.1  26.2   Platelets 150 -  400 K/uL  128  134      Most recent CMP    Latest Ref Rng & Units 06/19/2022   11:08 AM 11/09/2021    1:45 AM 11/08/2021    1:11 AM  CMP  Glucose 70 - 99 mg/dL 96  956  88   BUN 6 - 20 mg/dL 35  25  43   Creatinine 0.61 - 1.24 mg/dL 2.13  0.86  5.78   Sodium 135 - 145 mmol/L 138  135  136   Potassium 3.5 - 5.1 mmol/L 5.5  3.6  3.8   Chloride 98 - 111 mmol/L 101  100  100   CO2 22 - 32 mmol/L  24  23   Calcium  8.9 - 10.3 mg/dL  8.4  8.7    Donique Hammonds N. [name], MD FACS [facility] Phone Number: [phone] 02/23/2024 10:25 AM   Total time spent on preparing this encounter including chart review, data review, collecting history, examining the patient, and coordinating care: 60 minutes.  Portions of this report may have been transcribed using voice recognition software.  Every effort has been made to ensure accuracy; however, inadvertent computerized transcription errors may still be present.

## 2024-02-24 DIAGNOSIS — N186 End stage renal disease: Secondary | ICD-10-CM | POA: Diagnosis not present

## 2024-02-24 DIAGNOSIS — N2581 Secondary hyperparathyroidism of renal origin: Secondary | ICD-10-CM | POA: Diagnosis not present

## 2024-02-24 DIAGNOSIS — Z992 Dependence on renal dialysis: Secondary | ICD-10-CM | POA: Diagnosis not present

## 2024-02-26 DIAGNOSIS — Z992 Dependence on renal dialysis: Secondary | ICD-10-CM | POA: Diagnosis not present

## 2024-02-26 DIAGNOSIS — N186 End stage renal disease: Secondary | ICD-10-CM | POA: Diagnosis not present

## 2024-02-26 DIAGNOSIS — N2581 Secondary hyperparathyroidism of renal origin: Secondary | ICD-10-CM | POA: Diagnosis not present

## 2024-02-27 ENCOUNTER — Encounter (HOSPITAL_COMMUNITY): Payer: Self-pay | Admitting: Vascular Surgery

## 2024-02-28 DIAGNOSIS — N186 End stage renal disease: Secondary | ICD-10-CM | POA: Diagnosis not present

## 2024-02-28 DIAGNOSIS — Z992 Dependence on renal dialysis: Secondary | ICD-10-CM | POA: Diagnosis not present

## 2024-02-28 DIAGNOSIS — N2581 Secondary hyperparathyroidism of renal origin: Secondary | ICD-10-CM | POA: Diagnosis not present

## 2024-03-02 DIAGNOSIS — Z992 Dependence on renal dialysis: Secondary | ICD-10-CM | POA: Diagnosis not present

## 2024-03-02 DIAGNOSIS — N186 End stage renal disease: Secondary | ICD-10-CM | POA: Diagnosis not present

## 2024-03-02 DIAGNOSIS — N2581 Secondary hyperparathyroidism of renal origin: Secondary | ICD-10-CM | POA: Diagnosis not present

## 2024-03-04 DIAGNOSIS — N2581 Secondary hyperparathyroidism of renal origin: Secondary | ICD-10-CM | POA: Diagnosis not present

## 2024-03-04 DIAGNOSIS — N186 End stage renal disease: Secondary | ICD-10-CM | POA: Diagnosis not present

## 2024-03-04 DIAGNOSIS — Z992 Dependence on renal dialysis: Secondary | ICD-10-CM | POA: Diagnosis not present

## 2024-03-06 DIAGNOSIS — N186 End stage renal disease: Secondary | ICD-10-CM | POA: Diagnosis not present

## 2024-03-06 DIAGNOSIS — E1122 Type 2 diabetes mellitus with diabetic chronic kidney disease: Secondary | ICD-10-CM | POA: Diagnosis not present

## 2024-03-06 DIAGNOSIS — Z992 Dependence on renal dialysis: Secondary | ICD-10-CM | POA: Diagnosis not present

## 2024-03-06 DIAGNOSIS — N2581 Secondary hyperparathyroidism of renal origin: Secondary | ICD-10-CM | POA: Diagnosis not present

## 2024-03-08 ENCOUNTER — Other Ambulatory Visit: Payer: Self-pay

## 2024-03-08 DIAGNOSIS — N186 End stage renal disease: Secondary | ICD-10-CM

## 2024-03-09 DIAGNOSIS — N2581 Secondary hyperparathyroidism of renal origin: Secondary | ICD-10-CM | POA: Diagnosis not present

## 2024-03-09 DIAGNOSIS — N186 End stage renal disease: Secondary | ICD-10-CM | POA: Diagnosis not present

## 2024-03-09 DIAGNOSIS — Z992 Dependence on renal dialysis: Secondary | ICD-10-CM | POA: Diagnosis not present

## 2024-03-11 DIAGNOSIS — N2581 Secondary hyperparathyroidism of renal origin: Secondary | ICD-10-CM | POA: Diagnosis not present

## 2024-03-11 DIAGNOSIS — Z992 Dependence on renal dialysis: Secondary | ICD-10-CM | POA: Diagnosis not present

## 2024-03-11 DIAGNOSIS — N186 End stage renal disease: Secondary | ICD-10-CM | POA: Diagnosis not present

## 2024-03-13 DIAGNOSIS — Z992 Dependence on renal dialysis: Secondary | ICD-10-CM | POA: Diagnosis not present

## 2024-03-13 DIAGNOSIS — N2581 Secondary hyperparathyroidism of renal origin: Secondary | ICD-10-CM | POA: Diagnosis not present

## 2024-03-13 DIAGNOSIS — N186 End stage renal disease: Secondary | ICD-10-CM | POA: Diagnosis not present

## 2024-03-16 DIAGNOSIS — Z992 Dependence on renal dialysis: Secondary | ICD-10-CM | POA: Diagnosis not present

## 2024-03-16 DIAGNOSIS — N2581 Secondary hyperparathyroidism of renal origin: Secondary | ICD-10-CM | POA: Diagnosis not present

## 2024-03-16 DIAGNOSIS — N186 End stage renal disease: Secondary | ICD-10-CM | POA: Diagnosis not present

## 2024-03-18 DIAGNOSIS — Z992 Dependence on renal dialysis: Secondary | ICD-10-CM | POA: Diagnosis not present

## 2024-03-18 DIAGNOSIS — N2581 Secondary hyperparathyroidism of renal origin: Secondary | ICD-10-CM | POA: Diagnosis not present

## 2024-03-18 DIAGNOSIS — N186 End stage renal disease: Secondary | ICD-10-CM | POA: Diagnosis not present

## 2024-03-20 DIAGNOSIS — Z992 Dependence on renal dialysis: Secondary | ICD-10-CM | POA: Diagnosis not present

## 2024-03-20 DIAGNOSIS — N186 End stage renal disease: Secondary | ICD-10-CM | POA: Diagnosis not present

## 2024-03-20 DIAGNOSIS — N2581 Secondary hyperparathyroidism of renal origin: Secondary | ICD-10-CM | POA: Diagnosis not present

## 2024-03-23 DIAGNOSIS — N186 End stage renal disease: Secondary | ICD-10-CM | POA: Diagnosis not present

## 2024-03-23 DIAGNOSIS — Z992 Dependence on renal dialysis: Secondary | ICD-10-CM | POA: Diagnosis not present

## 2024-03-23 DIAGNOSIS — N2581 Secondary hyperparathyroidism of renal origin: Secondary | ICD-10-CM | POA: Diagnosis not present

## 2024-03-24 ENCOUNTER — Ambulatory Visit (INDEPENDENT_AMBULATORY_CARE_PROVIDER_SITE_OTHER): Payer: Self-pay

## 2024-03-24 ENCOUNTER — Ambulatory Visit (HOSPITAL_COMMUNITY)
Admission: RE | Admit: 2024-03-24 | Discharge: 2024-03-24 | Disposition: A | Discharge status: Home / Self Care | Payer: Self-pay | Source: Ambulatory Visit | Attending: Vascular Surgery | Admitting: Vascular Surgery

## 2024-03-24 VITALS — BP 135/79 | HR 76 | Temp 97.7°F | Ht 65.0 in | Wt 189.6 lb

## 2024-03-24 DIAGNOSIS — N186 End stage renal disease: Secondary | ICD-10-CM

## 2024-03-24 NOTE — Progress Notes (Signed)
 HISTORY AND PHYSICAL     CC:  dialysis access Requesting Provider:  No ref. provider found  HPI: This is a 58 y.o. male here for evaluation of his hemodialysis access.    He states that since his procedure, his fistula has been working well.  He has occasional tingling/numbness in his left hand. He does not have any motor deficit.    Dialysis access history: -left RC AVF 11/09/2021 Dr. Shaunna Delaware -fistulogram 06/19/2022 Dr. Rosalva Comber -fistulogram with angioplasty fistula 02/23/2024 Dr. [name]    The pt is right hand dominant.    Pt is on dialysis.   Days of dialysis if applicable:  M/W/F    HD center:  [address]  location.   The pt is on a statin for cholesterol management.  The pt is on a daily aspirin.  Other AC:  none The pt is not on medication for hypertension.  The pt is not on medication for diabetes.   Tobacco hx:  former  Past Medical History:  Diagnosis Date   Asthma    Chronic kidney disease    COPD (chronic obstructive pulmonary disease) (HCC)    Diabetes mellitus without complication (HCC)    Hypertension     Past Surgical History:  Procedure Laterality Date   A/V FISTULAGRAM Left 06/19/2022   Procedure: A/V Fistulagram;  Surgeon: Kayla Part, MD;  Location: [facility] INVASIVE CV LAB;  Service: Cardiovascular;  Laterality: Left;   A/V FISTULAGRAM N/A 10/03/2023   Procedure: A/V Fistulagram;  Surgeon: Patrick Boor, MD;  Location: [facility] INVASIVE CV LAB;  Service: Cardiovascular;  Laterality: N/A;   A/V SHUNT INTERVENTION N/A 02/23/2024   Procedure: A/V SHUNT INTERVENTION;  Surgeon: Carlene Che, MD;  Location: [facility] PV LAB;  Service: Cardiovascular;  Laterality: N/A;   AV FISTULA PLACEMENT Left 11/09/2021   Procedure: LEFT ARM ARTERIOVENOUS (AV) FISTULA CREATION ;  Surgeon: Dannis Dy, MD;  Location: [facility] OR;  Service: Vascular;  Laterality: Left;   BIOPSY  11/08/2021   Procedure: BIOPSY;  Surgeon: Alvis Jourdain, MD;  Location: [facility] ENDOSCOPY;  Service:  Endoscopy;;   COLONOSCOPY WITH PROPOFOL  N/A 11/08/2021   Procedure: COLONOSCOPY WITH PROPOFOL ;  Surgeon: Alvis Jourdain, MD;  Location: [facility] ENDOSCOPY;  Service: Endoscopy;  Laterality: N/A;   ESOPHAGOGASTRODUODENOSCOPY (EGD) WITH PROPOFOL  N/A 11/08/2021   Procedure: ESOPHAGOGASTRODUODENOSCOPY (EGD) WITH PROPOFOL ;  Surgeon: Alvis Jourdain, MD;  Location: [facility] ENDOSCOPY;  Service: Endoscopy;  Laterality: N/A;   IR FLUORO GUIDE CV LINE RIGHT  11/02/2021   PERIPHERAL VASCULAR BALLOON ANGIOPLASTY  10/03/2023   Procedure: PERIPHERAL VASCULAR BALLOON ANGIOPLASTY;  Surgeon: Patrick Boor, MD;  Location: [facility] INVASIVE CV LAB;  Service: Cardiovascular;;   POLYPECTOMY  11/08/2021   Procedure: POLYPECTOMY;  Surgeon: Alvis Jourdain, MD;  Location: [facility] ENDOSCOPY;  Service: Endoscopy;;   VENOUS ANGIOPLASTY  02/23/2024   Procedure: VENOUS ANGIOPLASTY;  Surgeon: Carlene Che, MD;  Location: [facility] PV LAB;  Service: Cardiovascular;;    No Known Allergies  Current Outpatient Medications  Medication Sig Dispense Refill   aspirin 81 MG EC tablet Take 81 mg by mouth daily.     atorvastatin  (LIPITOR) 20 MG tablet Take 1 tablet (20 mg total) by mouth daily. 30 tablet 2   calcium  carbonate (TUMS EX) 750 MG chewable tablet Chew 2 tablets by mouth daily as needed for heartburn.     Multiple Vitamins-Minerals (MULTIVITAMIN MEN 50+ PO) Take 1 tablet by mouth daily. One a day     Omega-3 Fatty  Acids (FISH OIL OMEGA-3 PO) Take 1,000 mg by mouth daily.     pantoprazole  (PROTONIX ) 40 MG tablet Take 1 tablet (40 mg total) by mouth daily. 30 tablet 3   sevelamer (RENAGEL) 800 MG tablet Take 1,600 mg by mouth 3 (three) times daily with meals.     vitamin B-12 (CYANOCOBALAMIN ) 100 MCG tablet Take 1 tablet (100 mcg total) by mouth daily. (Patient taking differently: Take 1,000 mcg by mouth daily.) 30 tablet 2   No current facility-administered medications for this visit.    Family History  Problem Relation Age of Onset   Diabetes Mother     Diabetes Father     Social History   Socioeconomic History   Marital status: Single    Spouse name: Not on file   Number of children: Not on file   Years of education: Not on file   Highest education level: Not on file  Occupational History   Not on file  Tobacco Use   Smoking status: Former    Types: Cigarettes   Smokeless tobacco: Current    Types: Snuff  Vaping Use   Vaping status: Never Used  Substance and Sexual Activity   Alcohol use: Yes   Drug use: Yes    Types: Marijuana   Sexual activity: Not on file  Other Topics Concern   Not on file  Social History Narrative   Not on file   Social Drivers of Health   Financial Resource Strain: Not on file  Food Insecurity: Not on file  Transportation Needs: Not on file  Physical Activity: Not on file  Stress: Not on file  Social Connections: Not on file  Intimate Partner Violence: Not on file     ROS: [x]  Positive   [ ]  Negative   [ ]  All sytems reviewed and are negative  Cardiac: []  chest pain/pressure []  SOB/DOE  Vascular: [x]  tingling left hand occasionally   Pulmonary: []  asthma []  wheezing  Neurologic: []  hx CVA/TIA  Hematologic: []  bleeding problems  GI []  GERD  GU: [x]  CKD/renal failure  [x]  HD---[x]  M/W/F []  T/T/S  Psychiatric: []  hx of major depression  Integumentary: []  rashes []  ulcers  Constitutional: []  fever []  chills   PHYSICAL EXAMINATION:  Today's Vitals   03/24/24 1402  BP: 135/79  Pulse: 76  Temp: 97.7 F (36.5 C)  TempSrc: Temporal  SpO2: 97%  Weight: 189 lb 9.6 oz (86 kg)  Height: 5' 5 (1.651 m)   Body mass index is 31.55 kg/m.    General:  WDWN male in NAD Gait: Not observed HENT: WNL Pulmonary: normal non-labored breathing  Cardiac: regular Skin: without rashes Vascular Exam/Pulses:  Left radial pulse is easily palpable Extremities:  + thrill left arm fistula; motor and sensory are in tact. Musculoskeletal: no muscle wasting or  atrophy  Neurologic: A&O X 3  Non-Invasive Vascular Imaging:   Upper Extremity dialysis duplex on 03/24/2024: Findings:  +--------------------+----------+-----------------+--------+  AVF                PSV (cm/s)Flow Vol (mL/min)Comments  +--------------------+----------+-----------------+--------+  Native artery inflow   217                               +--------------------+----------+-----------------+--------+  AVF Anastomosis        329          1863                 +--------------------+----------+-----------------+--------+     +------------+----------+-------------+----------+----------------+  OUTFLOW VEINPSV (cm/s)Diameter (cm)Depth (cm)    Describe      +------------+----------+-------------+----------+----------------+  AC Fossa        96        0.67        0.26                     +------------+----------+-------------+----------+----------------+  Prox Forearm   115        0.51        0.59                     +------------+----------+-------------+----------+----------------+  Mid Forearm     77        0.50        0.42   competing branch  +------------+----------+-------------+----------+----------------+  Dist Forearm   263        0.79        0.22                     +------------+----------+-------------+----------+----------------+    ASSESSMENT/PLAN: 58 y.o. male with ESRD here for evaluation of his hemodialysis access with hx of recent fistulogram left RC AVF 02/23/2024 by Dr. [name]    -pt has easily palpable pulse and his fistula is working well since angioplasty.   Diameter on duplex 0.5cm, however, fistula is working well with 1.8L flow through it.  Would not recommend any intervention unless the fistula is malfunctioning.  -pt is on dialysis on M/W/F -discussed with pt that access does not last forever and will need intervention or even new access at some point.  -pt is right hand dominant   Maryanna Smart,  [facility] [phone]  Clinic MD:   Vikki Graves

## 2024-03-25 DIAGNOSIS — N2581 Secondary hyperparathyroidism of renal origin: Secondary | ICD-10-CM | POA: Diagnosis not present

## 2024-03-25 DIAGNOSIS — N186 End stage renal disease: Secondary | ICD-10-CM | POA: Diagnosis not present

## 2024-03-25 DIAGNOSIS — Z992 Dependence on renal dialysis: Secondary | ICD-10-CM | POA: Diagnosis not present

## 2024-03-27 DIAGNOSIS — N186 End stage renal disease: Secondary | ICD-10-CM | POA: Diagnosis not present

## 2024-03-27 DIAGNOSIS — N2581 Secondary hyperparathyroidism of renal origin: Secondary | ICD-10-CM | POA: Diagnosis not present

## 2024-03-27 DIAGNOSIS — Z992 Dependence on renal dialysis: Secondary | ICD-10-CM | POA: Diagnosis not present

## 2024-03-30 DIAGNOSIS — Z992 Dependence on renal dialysis: Secondary | ICD-10-CM | POA: Diagnosis not present

## 2024-03-30 DIAGNOSIS — N186 End stage renal disease: Secondary | ICD-10-CM | POA: Diagnosis not present

## 2024-03-30 DIAGNOSIS — N2581 Secondary hyperparathyroidism of renal origin: Secondary | ICD-10-CM | POA: Diagnosis not present

## 2024-04-01 DIAGNOSIS — N2581 Secondary hyperparathyroidism of renal origin: Secondary | ICD-10-CM | POA: Diagnosis not present

## 2024-04-01 DIAGNOSIS — N186 End stage renal disease: Secondary | ICD-10-CM | POA: Diagnosis not present

## 2024-04-01 DIAGNOSIS — Z992 Dependence on renal dialysis: Secondary | ICD-10-CM | POA: Diagnosis not present

## 2024-04-03 DIAGNOSIS — N2581 Secondary hyperparathyroidism of renal origin: Secondary | ICD-10-CM | POA: Diagnosis not present

## 2024-04-03 DIAGNOSIS — Z992 Dependence on renal dialysis: Secondary | ICD-10-CM | POA: Diagnosis not present

## 2024-04-03 DIAGNOSIS — N186 End stage renal disease: Secondary | ICD-10-CM | POA: Diagnosis not present

## 2024-04-05 DIAGNOSIS — Z992 Dependence on renal dialysis: Secondary | ICD-10-CM | POA: Diagnosis not present

## 2024-04-05 DIAGNOSIS — N186 End stage renal disease: Secondary | ICD-10-CM | POA: Diagnosis not present

## 2024-04-05 DIAGNOSIS — E1122 Type 2 diabetes mellitus with diabetic chronic kidney disease: Secondary | ICD-10-CM | POA: Diagnosis not present

## 2024-04-06 DIAGNOSIS — N186 End stage renal disease: Secondary | ICD-10-CM | POA: Diagnosis not present

## 2024-04-06 DIAGNOSIS — E1122 Type 2 diabetes mellitus with diabetic chronic kidney disease: Secondary | ICD-10-CM | POA: Diagnosis not present

## 2024-04-06 DIAGNOSIS — Z992 Dependence on renal dialysis: Secondary | ICD-10-CM | POA: Diagnosis not present

## 2024-04-06 DIAGNOSIS — N2581 Secondary hyperparathyroidism of renal origin: Secondary | ICD-10-CM | POA: Diagnosis not present

## 2024-04-06 DIAGNOSIS — D631 Anemia in chronic kidney disease: Secondary | ICD-10-CM | POA: Diagnosis not present

## 2024-04-08 DIAGNOSIS — Z992 Dependence on renal dialysis: Secondary | ICD-10-CM | POA: Diagnosis not present

## 2024-04-08 DIAGNOSIS — N2581 Secondary hyperparathyroidism of renal origin: Secondary | ICD-10-CM | POA: Diagnosis not present

## 2024-04-08 DIAGNOSIS — N186 End stage renal disease: Secondary | ICD-10-CM | POA: Diagnosis not present

## 2024-04-08 DIAGNOSIS — D631 Anemia in chronic kidney disease: Secondary | ICD-10-CM | POA: Diagnosis not present

## 2024-04-10 DIAGNOSIS — N2581 Secondary hyperparathyroidism of renal origin: Secondary | ICD-10-CM | POA: Diagnosis not present

## 2024-04-10 DIAGNOSIS — N186 End stage renal disease: Secondary | ICD-10-CM | POA: Diagnosis not present

## 2024-04-10 DIAGNOSIS — Z992 Dependence on renal dialysis: Secondary | ICD-10-CM | POA: Diagnosis not present

## 2024-04-10 DIAGNOSIS — D631 Anemia in chronic kidney disease: Secondary | ICD-10-CM | POA: Diagnosis not present

## 2024-04-13 DIAGNOSIS — D631 Anemia in chronic kidney disease: Secondary | ICD-10-CM | POA: Diagnosis not present

## 2024-04-13 DIAGNOSIS — N2581 Secondary hyperparathyroidism of renal origin: Secondary | ICD-10-CM | POA: Diagnosis not present

## 2024-04-13 DIAGNOSIS — N186 End stage renal disease: Secondary | ICD-10-CM | POA: Diagnosis not present

## 2024-04-13 DIAGNOSIS — Z992 Dependence on renal dialysis: Secondary | ICD-10-CM | POA: Diagnosis not present

## 2024-04-15 DIAGNOSIS — D631 Anemia in chronic kidney disease: Secondary | ICD-10-CM | POA: Diagnosis not present

## 2024-04-15 DIAGNOSIS — N2581 Secondary hyperparathyroidism of renal origin: Secondary | ICD-10-CM | POA: Diagnosis not present

## 2024-04-15 DIAGNOSIS — Z992 Dependence on renal dialysis: Secondary | ICD-10-CM | POA: Diagnosis not present

## 2024-04-15 DIAGNOSIS — N186 End stage renal disease: Secondary | ICD-10-CM | POA: Diagnosis not present

## 2024-04-17 DIAGNOSIS — N186 End stage renal disease: Secondary | ICD-10-CM | POA: Diagnosis not present

## 2024-04-17 DIAGNOSIS — Z992 Dependence on renal dialysis: Secondary | ICD-10-CM | POA: Diagnosis not present

## 2024-04-17 DIAGNOSIS — D631 Anemia in chronic kidney disease: Secondary | ICD-10-CM | POA: Diagnosis not present

## 2024-04-17 DIAGNOSIS — N2581 Secondary hyperparathyroidism of renal origin: Secondary | ICD-10-CM | POA: Diagnosis not present

## 2024-04-20 DIAGNOSIS — Z992 Dependence on renal dialysis: Secondary | ICD-10-CM | POA: Diagnosis not present

## 2024-04-20 DIAGNOSIS — N186 End stage renal disease: Secondary | ICD-10-CM | POA: Diagnosis not present

## 2024-04-20 DIAGNOSIS — N2581 Secondary hyperparathyroidism of renal origin: Secondary | ICD-10-CM | POA: Diagnosis not present

## 2024-04-20 DIAGNOSIS — D631 Anemia in chronic kidney disease: Secondary | ICD-10-CM | POA: Diagnosis not present

## 2024-04-22 DIAGNOSIS — N2581 Secondary hyperparathyroidism of renal origin: Secondary | ICD-10-CM | POA: Diagnosis not present

## 2024-04-22 DIAGNOSIS — N186 End stage renal disease: Secondary | ICD-10-CM | POA: Diagnosis not present

## 2024-04-22 DIAGNOSIS — D631 Anemia in chronic kidney disease: Secondary | ICD-10-CM | POA: Diagnosis not present

## 2024-04-22 DIAGNOSIS — Z992 Dependence on renal dialysis: Secondary | ICD-10-CM | POA: Diagnosis not present

## 2024-04-24 DIAGNOSIS — N2581 Secondary hyperparathyroidism of renal origin: Secondary | ICD-10-CM | POA: Diagnosis not present

## 2024-04-24 DIAGNOSIS — D631 Anemia in chronic kidney disease: Secondary | ICD-10-CM | POA: Diagnosis not present

## 2024-04-24 DIAGNOSIS — N186 End stage renal disease: Secondary | ICD-10-CM | POA: Diagnosis not present

## 2024-04-24 DIAGNOSIS — Z992 Dependence on renal dialysis: Secondary | ICD-10-CM | POA: Diagnosis not present

## 2024-04-27 DIAGNOSIS — Z992 Dependence on renal dialysis: Secondary | ICD-10-CM | POA: Diagnosis not present

## 2024-04-27 DIAGNOSIS — N2581 Secondary hyperparathyroidism of renal origin: Secondary | ICD-10-CM | POA: Diagnosis not present

## 2024-04-27 DIAGNOSIS — D631 Anemia in chronic kidney disease: Secondary | ICD-10-CM | POA: Diagnosis not present

## 2024-04-27 DIAGNOSIS — N186 End stage renal disease: Secondary | ICD-10-CM | POA: Diagnosis not present

## 2024-04-29 DIAGNOSIS — D631 Anemia in chronic kidney disease: Secondary | ICD-10-CM | POA: Diagnosis not present

## 2024-04-29 DIAGNOSIS — N2581 Secondary hyperparathyroidism of renal origin: Secondary | ICD-10-CM | POA: Diagnosis not present

## 2024-04-29 DIAGNOSIS — Z992 Dependence on renal dialysis: Secondary | ICD-10-CM | POA: Diagnosis not present

## 2024-04-29 DIAGNOSIS — N186 End stage renal disease: Secondary | ICD-10-CM | POA: Diagnosis not present

## 2024-05-01 DIAGNOSIS — N186 End stage renal disease: Secondary | ICD-10-CM | POA: Diagnosis not present

## 2024-05-01 DIAGNOSIS — Z992 Dependence on renal dialysis: Secondary | ICD-10-CM | POA: Diagnosis not present

## 2024-05-01 DIAGNOSIS — N2581 Secondary hyperparathyroidism of renal origin: Secondary | ICD-10-CM | POA: Diagnosis not present

## 2024-05-01 DIAGNOSIS — D631 Anemia in chronic kidney disease: Secondary | ICD-10-CM | POA: Diagnosis not present

## 2024-05-04 DIAGNOSIS — N186 End stage renal disease: Secondary | ICD-10-CM | POA: Diagnosis not present

## 2024-05-04 DIAGNOSIS — D631 Anemia in chronic kidney disease: Secondary | ICD-10-CM | POA: Diagnosis not present

## 2024-05-04 DIAGNOSIS — N2581 Secondary hyperparathyroidism of renal origin: Secondary | ICD-10-CM | POA: Diagnosis not present

## 2024-05-04 DIAGNOSIS — Z992 Dependence on renal dialysis: Secondary | ICD-10-CM | POA: Diagnosis not present

## 2024-05-06 DIAGNOSIS — N2581 Secondary hyperparathyroidism of renal origin: Secondary | ICD-10-CM | POA: Diagnosis not present

## 2024-05-06 DIAGNOSIS — Z992 Dependence on renal dialysis: Secondary | ICD-10-CM | POA: Diagnosis not present

## 2024-05-06 DIAGNOSIS — N186 End stage renal disease: Secondary | ICD-10-CM | POA: Diagnosis not present

## 2024-05-06 DIAGNOSIS — D631 Anemia in chronic kidney disease: Secondary | ICD-10-CM | POA: Diagnosis not present

## 2024-05-08 DIAGNOSIS — Z992 Dependence on renal dialysis: Secondary | ICD-10-CM | POA: Diagnosis not present

## 2024-05-08 DIAGNOSIS — D689 Coagulation defect, unspecified: Secondary | ICD-10-CM | POA: Diagnosis not present

## 2024-05-08 DIAGNOSIS — N2581 Secondary hyperparathyroidism of renal origin: Secondary | ICD-10-CM | POA: Diagnosis not present

## 2024-05-08 DIAGNOSIS — N186 End stage renal disease: Secondary | ICD-10-CM | POA: Diagnosis not present

## 2024-05-11 DIAGNOSIS — Z992 Dependence on renal dialysis: Secondary | ICD-10-CM | POA: Diagnosis not present

## 2024-05-11 DIAGNOSIS — N2581 Secondary hyperparathyroidism of renal origin: Secondary | ICD-10-CM | POA: Diagnosis not present

## 2024-05-11 DIAGNOSIS — N186 End stage renal disease: Secondary | ICD-10-CM | POA: Diagnosis not present

## 2024-05-11 DIAGNOSIS — D689 Coagulation defect, unspecified: Secondary | ICD-10-CM | POA: Diagnosis not present

## 2024-05-13 DIAGNOSIS — N2581 Secondary hyperparathyroidism of renal origin: Secondary | ICD-10-CM | POA: Diagnosis not present

## 2024-05-13 DIAGNOSIS — Z992 Dependence on renal dialysis: Secondary | ICD-10-CM | POA: Diagnosis not present

## 2024-05-13 DIAGNOSIS — N186 End stage renal disease: Secondary | ICD-10-CM | POA: Diagnosis not present

## 2024-05-13 DIAGNOSIS — D689 Coagulation defect, unspecified: Secondary | ICD-10-CM | POA: Diagnosis not present

## 2024-05-15 DIAGNOSIS — Z992 Dependence on renal dialysis: Secondary | ICD-10-CM | POA: Diagnosis not present

## 2024-05-15 DIAGNOSIS — N2581 Secondary hyperparathyroidism of renal origin: Secondary | ICD-10-CM | POA: Diagnosis not present

## 2024-05-15 DIAGNOSIS — N186 End stage renal disease: Secondary | ICD-10-CM | POA: Diagnosis not present

## 2024-05-15 DIAGNOSIS — D689 Coagulation defect, unspecified: Secondary | ICD-10-CM | POA: Diagnosis not present

## 2024-05-18 DIAGNOSIS — N186 End stage renal disease: Secondary | ICD-10-CM | POA: Diagnosis not present

## 2024-05-18 DIAGNOSIS — Z992 Dependence on renal dialysis: Secondary | ICD-10-CM | POA: Diagnosis not present

## 2024-05-18 DIAGNOSIS — D631 Anemia in chronic kidney disease: Secondary | ICD-10-CM | POA: Diagnosis not present

## 2024-05-18 DIAGNOSIS — D689 Coagulation defect, unspecified: Secondary | ICD-10-CM | POA: Diagnosis not present

## 2024-05-18 DIAGNOSIS — N2581 Secondary hyperparathyroidism of renal origin: Secondary | ICD-10-CM | POA: Diagnosis not present

## 2024-05-20 DIAGNOSIS — N186 End stage renal disease: Secondary | ICD-10-CM | POA: Diagnosis not present

## 2024-05-20 DIAGNOSIS — Z992 Dependence on renal dialysis: Secondary | ICD-10-CM | POA: Diagnosis not present

## 2024-05-20 DIAGNOSIS — N2581 Secondary hyperparathyroidism of renal origin: Secondary | ICD-10-CM | POA: Diagnosis not present

## 2024-05-20 DIAGNOSIS — D689 Coagulation defect, unspecified: Secondary | ICD-10-CM | POA: Diagnosis not present

## 2024-05-20 DIAGNOSIS — D631 Anemia in chronic kidney disease: Secondary | ICD-10-CM | POA: Diagnosis not present

## 2024-05-22 DIAGNOSIS — Z992 Dependence on renal dialysis: Secondary | ICD-10-CM | POA: Diagnosis not present

## 2024-05-22 DIAGNOSIS — D631 Anemia in chronic kidney disease: Secondary | ICD-10-CM | POA: Diagnosis not present

## 2024-05-22 DIAGNOSIS — N186 End stage renal disease: Secondary | ICD-10-CM | POA: Diagnosis not present

## 2024-05-22 DIAGNOSIS — N2581 Secondary hyperparathyroidism of renal origin: Secondary | ICD-10-CM | POA: Diagnosis not present

## 2024-05-22 DIAGNOSIS — D689 Coagulation defect, unspecified: Secondary | ICD-10-CM | POA: Diagnosis not present

## 2024-05-25 DIAGNOSIS — D689 Coagulation defect, unspecified: Secondary | ICD-10-CM | POA: Diagnosis not present

## 2024-05-25 DIAGNOSIS — N186 End stage renal disease: Secondary | ICD-10-CM | POA: Diagnosis not present

## 2024-05-25 DIAGNOSIS — Z992 Dependence on renal dialysis: Secondary | ICD-10-CM | POA: Diagnosis not present

## 2024-05-25 DIAGNOSIS — N2581 Secondary hyperparathyroidism of renal origin: Secondary | ICD-10-CM | POA: Diagnosis not present

## 2024-06-01 DIAGNOSIS — Z992 Dependence on renal dialysis: Secondary | ICD-10-CM | POA: Diagnosis not present

## 2024-06-01 DIAGNOSIS — N186 End stage renal disease: Secondary | ICD-10-CM | POA: Diagnosis not present

## 2024-06-01 DIAGNOSIS — D689 Coagulation defect, unspecified: Secondary | ICD-10-CM | POA: Diagnosis not present

## 2024-06-01 DIAGNOSIS — N2581 Secondary hyperparathyroidism of renal origin: Secondary | ICD-10-CM | POA: Diagnosis not present

## 2024-06-06 DIAGNOSIS — E1122 Type 2 diabetes mellitus with diabetic chronic kidney disease: Secondary | ICD-10-CM | POA: Diagnosis not present

## 2024-06-06 DIAGNOSIS — N186 End stage renal disease: Secondary | ICD-10-CM | POA: Diagnosis not present

## 2024-06-06 DIAGNOSIS — Z992 Dependence on renal dialysis: Secondary | ICD-10-CM | POA: Diagnosis not present

## 2024-06-08 DIAGNOSIS — D689 Coagulation defect, unspecified: Secondary | ICD-10-CM | POA: Diagnosis not present

## 2024-06-08 DIAGNOSIS — N186 End stage renal disease: Secondary | ICD-10-CM | POA: Diagnosis not present

## 2024-06-08 DIAGNOSIS — N2581 Secondary hyperparathyroidism of renal origin: Secondary | ICD-10-CM | POA: Diagnosis not present

## 2024-06-08 DIAGNOSIS — Z992 Dependence on renal dialysis: Secondary | ICD-10-CM | POA: Diagnosis not present

## 2024-06-15 DIAGNOSIS — D631 Anemia in chronic kidney disease: Secondary | ICD-10-CM | POA: Diagnosis not present

## 2024-06-15 DIAGNOSIS — Z992 Dependence on renal dialysis: Secondary | ICD-10-CM | POA: Diagnosis not present

## 2024-06-15 DIAGNOSIS — N2581 Secondary hyperparathyroidism of renal origin: Secondary | ICD-10-CM | POA: Diagnosis not present

## 2024-06-15 DIAGNOSIS — N186 End stage renal disease: Secondary | ICD-10-CM | POA: Diagnosis not present

## 2024-06-15 DIAGNOSIS — D689 Coagulation defect, unspecified: Secondary | ICD-10-CM | POA: Diagnosis not present

## 2024-06-22 DIAGNOSIS — Z992 Dependence on renal dialysis: Secondary | ICD-10-CM | POA: Diagnosis not present

## 2024-06-22 DIAGNOSIS — E782 Mixed hyperlipidemia: Secondary | ICD-10-CM | POA: Diagnosis not present

## 2024-06-22 DIAGNOSIS — Z6831 Body mass index (BMI) 31.0-31.9, adult: Secondary | ICD-10-CM | POA: Diagnosis not present

## 2024-06-22 DIAGNOSIS — E119 Type 2 diabetes mellitus without complications: Secondary | ICD-10-CM | POA: Diagnosis not present

## 2024-06-22 DIAGNOSIS — D689 Coagulation defect, unspecified: Secondary | ICD-10-CM | POA: Diagnosis not present

## 2024-06-22 DIAGNOSIS — N2581 Secondary hyperparathyroidism of renal origin: Secondary | ICD-10-CM | POA: Diagnosis not present

## 2024-06-22 DIAGNOSIS — R0989 Other specified symptoms and signs involving the circulatory and respiratory systems: Secondary | ICD-10-CM | POA: Diagnosis not present

## 2024-06-22 DIAGNOSIS — I10 Essential (primary) hypertension: Secondary | ICD-10-CM | POA: Diagnosis not present

## 2024-06-22 DIAGNOSIS — K219 Gastro-esophageal reflux disease without esophagitis: Secondary | ICD-10-CM | POA: Diagnosis not present

## 2024-06-22 DIAGNOSIS — Z1331 Encounter for screening for depression: Secondary | ICD-10-CM | POA: Diagnosis not present

## 2024-06-22 DIAGNOSIS — Z125 Encounter for screening for malignant neoplasm of prostate: Secondary | ICD-10-CM | POA: Diagnosis not present

## 2024-06-22 DIAGNOSIS — N186 End stage renal disease: Secondary | ICD-10-CM | POA: Diagnosis not present

## 2024-06-22 DIAGNOSIS — Z0000 Encounter for general adult medical examination without abnormal findings: Secondary | ICD-10-CM | POA: Diagnosis not present

## 2024-06-23 ENCOUNTER — Other Ambulatory Visit: Payer: Self-pay | Admitting: Physician Assistant

## 2024-06-23 ENCOUNTER — Encounter: Payer: Self-pay | Admitting: Physician Assistant

## 2024-06-23 DIAGNOSIS — R0989 Other specified symptoms and signs involving the circulatory and respiratory systems: Secondary | ICD-10-CM

## 2024-06-28 ENCOUNTER — Ambulatory Visit

## 2024-06-28 DIAGNOSIS — R011 Cardiac murmur, unspecified: Secondary | ICD-10-CM

## 2024-06-28 DIAGNOSIS — I6523 Occlusion and stenosis of bilateral carotid arteries: Secondary | ICD-10-CM | POA: Diagnosis not present

## 2024-06-28 DIAGNOSIS — R0989 Other specified symptoms and signs involving the circulatory and respiratory systems: Secondary | ICD-10-CM

## 2024-06-29 DIAGNOSIS — N186 End stage renal disease: Secondary | ICD-10-CM | POA: Diagnosis not present

## 2024-06-29 DIAGNOSIS — D689 Coagulation defect, unspecified: Secondary | ICD-10-CM | POA: Diagnosis not present

## 2024-06-29 DIAGNOSIS — N2581 Secondary hyperparathyroidism of renal origin: Secondary | ICD-10-CM | POA: Diagnosis not present

## 2024-06-29 DIAGNOSIS — Z992 Dependence on renal dialysis: Secondary | ICD-10-CM | POA: Diagnosis not present

## 2024-07-06 DIAGNOSIS — N186 End stage renal disease: Secondary | ICD-10-CM | POA: Diagnosis not present

## 2024-07-06 DIAGNOSIS — Z992 Dependence on renal dialysis: Secondary | ICD-10-CM | POA: Diagnosis not present

## 2024-07-06 DIAGNOSIS — N2581 Secondary hyperparathyroidism of renal origin: Secondary | ICD-10-CM | POA: Diagnosis not present

## 2024-07-06 DIAGNOSIS — E1122 Type 2 diabetes mellitus with diabetic chronic kidney disease: Secondary | ICD-10-CM | POA: Diagnosis not present

## 2024-07-06 DIAGNOSIS — D689 Coagulation defect, unspecified: Secondary | ICD-10-CM | POA: Diagnosis not present

## 2024-07-08 DIAGNOSIS — N2581 Secondary hyperparathyroidism of renal origin: Secondary | ICD-10-CM | POA: Diagnosis not present

## 2024-07-08 DIAGNOSIS — N186 End stage renal disease: Secondary | ICD-10-CM | POA: Diagnosis not present

## 2024-07-08 DIAGNOSIS — Z992 Dependence on renal dialysis: Secondary | ICD-10-CM | POA: Diagnosis not present

## 2024-07-08 DIAGNOSIS — D689 Coagulation defect, unspecified: Secondary | ICD-10-CM | POA: Diagnosis not present

## 2024-07-13 DIAGNOSIS — D631 Anemia in chronic kidney disease: Secondary | ICD-10-CM | POA: Diagnosis not present

## 2024-07-13 DIAGNOSIS — N186 End stage renal disease: Secondary | ICD-10-CM | POA: Diagnosis not present

## 2024-07-13 DIAGNOSIS — N2581 Secondary hyperparathyroidism of renal origin: Secondary | ICD-10-CM | POA: Diagnosis not present

## 2024-07-13 DIAGNOSIS — Z992 Dependence on renal dialysis: Secondary | ICD-10-CM | POA: Diagnosis not present

## 2024-07-13 DIAGNOSIS — D689 Coagulation defect, unspecified: Secondary | ICD-10-CM | POA: Diagnosis not present

## 2024-07-20 DIAGNOSIS — Z992 Dependence on renal dialysis: Secondary | ICD-10-CM | POA: Diagnosis not present

## 2024-07-20 DIAGNOSIS — N2581 Secondary hyperparathyroidism of renal origin: Secondary | ICD-10-CM | POA: Diagnosis not present

## 2024-07-20 DIAGNOSIS — D689 Coagulation defect, unspecified: Secondary | ICD-10-CM | POA: Diagnosis not present

## 2024-07-20 DIAGNOSIS — Z23 Encounter for immunization: Secondary | ICD-10-CM | POA: Diagnosis not present

## 2024-07-20 DIAGNOSIS — N186 End stage renal disease: Secondary | ICD-10-CM | POA: Diagnosis not present

## 2024-07-27 DIAGNOSIS — N186 End stage renal disease: Secondary | ICD-10-CM | POA: Diagnosis not present

## 2024-07-27 DIAGNOSIS — D689 Coagulation defect, unspecified: Secondary | ICD-10-CM | POA: Diagnosis not present

## 2024-07-27 DIAGNOSIS — Z992 Dependence on renal dialysis: Secondary | ICD-10-CM | POA: Diagnosis not present

## 2024-07-27 DIAGNOSIS — D631 Anemia in chronic kidney disease: Secondary | ICD-10-CM | POA: Diagnosis not present

## 2024-07-27 DIAGNOSIS — N2581 Secondary hyperparathyroidism of renal origin: Secondary | ICD-10-CM | POA: Diagnosis not present

## 2024-08-03 DIAGNOSIS — N2581 Secondary hyperparathyroidism of renal origin: Secondary | ICD-10-CM | POA: Diagnosis not present

## 2024-08-03 DIAGNOSIS — N186 End stage renal disease: Secondary | ICD-10-CM | POA: Diagnosis not present

## 2024-08-03 DIAGNOSIS — Z992 Dependence on renal dialysis: Secondary | ICD-10-CM | POA: Diagnosis not present

## 2024-08-03 DIAGNOSIS — D689 Coagulation defect, unspecified: Secondary | ICD-10-CM | POA: Diagnosis not present

## 2024-08-03 DIAGNOSIS — D631 Anemia in chronic kidney disease: Secondary | ICD-10-CM | POA: Diagnosis not present

## 2024-08-06 DIAGNOSIS — N186 End stage renal disease: Secondary | ICD-10-CM | POA: Diagnosis not present

## 2024-08-06 DIAGNOSIS — Z992 Dependence on renal dialysis: Secondary | ICD-10-CM | POA: Diagnosis not present

## 2024-08-06 DIAGNOSIS — E1122 Type 2 diabetes mellitus with diabetic chronic kidney disease: Secondary | ICD-10-CM | POA: Diagnosis not present

## 2024-08-07 DIAGNOSIS — N186 End stage renal disease: Secondary | ICD-10-CM | POA: Diagnosis not present

## 2024-08-07 DIAGNOSIS — Z992 Dependence on renal dialysis: Secondary | ICD-10-CM | POA: Diagnosis not present

## 2024-08-07 DIAGNOSIS — D689 Coagulation defect, unspecified: Secondary | ICD-10-CM | POA: Diagnosis not present

## 2024-08-07 DIAGNOSIS — N2581 Secondary hyperparathyroidism of renal origin: Secondary | ICD-10-CM | POA: Diagnosis not present

## 2024-08-10 DIAGNOSIS — D689 Coagulation defect, unspecified: Secondary | ICD-10-CM | POA: Diagnosis not present

## 2024-08-10 DIAGNOSIS — D631 Anemia in chronic kidney disease: Secondary | ICD-10-CM | POA: Diagnosis not present

## 2024-08-10 DIAGNOSIS — Z992 Dependence on renal dialysis: Secondary | ICD-10-CM | POA: Diagnosis not present

## 2024-08-10 DIAGNOSIS — N2581 Secondary hyperparathyroidism of renal origin: Secondary | ICD-10-CM | POA: Diagnosis not present

## 2024-08-10 DIAGNOSIS — N186 End stage renal disease: Secondary | ICD-10-CM | POA: Diagnosis not present

## 2024-08-16 ENCOUNTER — Ambulatory Visit (HOSPITAL_COMMUNITY)
Admission: RE | Disposition: A | Discharge status: Home / Self Care | Payer: Self-pay | Source: Home / Self Care | Attending: Vascular Surgery

## 2024-08-16 ENCOUNTER — Other Ambulatory Visit: Payer: Self-pay

## 2024-08-16 ENCOUNTER — Ambulatory Visit (HOSPITAL_COMMUNITY)
Admission: RE | Admit: 2024-08-16 | Discharge: 2024-08-16 | Disposition: A | Discharge status: Home / Self Care | Attending: Vascular Surgery | Admitting: Vascular Surgery

## 2024-08-16 DIAGNOSIS — Z992 Dependence on renal dialysis: Secondary | ICD-10-CM | POA: Diagnosis not present

## 2024-08-16 DIAGNOSIS — Y832 Surgical operation with anastomosis, bypass or graft as the cause of abnormal reaction of the patient, or of later complication, without mention of misadventure at the time of the procedure: Secondary | ICD-10-CM | POA: Insufficient documentation

## 2024-08-16 DIAGNOSIS — I120 Hypertensive chronic kidney disease with stage 5 chronic kidney disease or end stage renal disease: Secondary | ICD-10-CM | POA: Diagnosis not present

## 2024-08-16 DIAGNOSIS — T82858A Stenosis of vascular prosthetic devices, implants and grafts, initial encounter: Secondary | ICD-10-CM | POA: Insufficient documentation

## 2024-08-16 DIAGNOSIS — E1122 Type 2 diabetes mellitus with diabetic chronic kidney disease: Secondary | ICD-10-CM | POA: Diagnosis not present

## 2024-08-16 DIAGNOSIS — F17290 Nicotine dependence, other tobacco product, uncomplicated: Secondary | ICD-10-CM | POA: Insufficient documentation

## 2024-08-16 DIAGNOSIS — N186 End stage renal disease: Secondary | ICD-10-CM | POA: Insufficient documentation

## 2024-08-16 HISTORY — PX: A/V FISTULAGRAM: CATH118298

## 2024-08-16 HISTORY — PX: VENOUS ANGIOPLASTY: CATH118376

## 2024-08-16 LAB — GLUCOSE, CAPILLARY: Glucose-Capillary: 235 mg/dL — ABNORMAL HIGH (ref 70–99)

## 2024-08-16 SURGERY — A/V FISTULAGRAM
Anesthesia: LOCAL | Site: Arm Lower | Laterality: Left

## 2024-08-16 MED ORDER — LIDOCAINE HCL (PF) 1 % IJ SOLN
INTRAMUSCULAR | Status: DC | PRN
  Administered 2024-08-16: 2 mL via INTRADERMAL

## 2024-08-16 MED ORDER — IODIXANOL 320 MG/ML IV SOLN
INTRAVENOUS | Status: DC | PRN
  Administered 2024-08-16: 25 mL via INTRAVENOUS

## 2024-08-16 MED ORDER — LIDOCAINE HCL (PF) 1 % IJ SOLN
INTRAMUSCULAR | Status: AC
  Filled 2024-08-16: qty 30

## 2024-08-16 MED ORDER — HEPARIN (PORCINE) IN NACL 1000-0.9 UT/500ML-% IV SOLN
INTRAVENOUS | Status: DC | PRN
  Administered 2024-08-16: 500 mL

## 2024-08-16 SURGICAL SUPPLY — 9 items
BALLOON MUSTANG 7X80X75 (BALLOONS) IMPLANT
DEVICE INFLATION ENCORE 26 (MISCELLANEOUS) IMPLANT
KIT MICROPUNCTURE NIT STIFF (SHEATH) IMPLANT
KIT PV (KITS) ×2 IMPLANT
SHEATH PINNACLE R/O II 6F 4CM (SHEATH) IMPLANT
SHEATH PROBE COVER 6X72 (BAG) IMPLANT
TRAY PV CATH (CUSTOM PROCEDURE TRAY) ×2 IMPLANT
TUBING CIL FLEX 10 FLL-RA (TUBING) IMPLANT
WIRE BENTSON .035X145CM (WIRE) IMPLANT

## 2024-08-16 NOTE — H&P (Signed)
 [facility] [place]  ASSESSMENT / PLAN: 58 y.o. male with poorly functioning left forearm Cimino fistula. Patient reports low flows. Plan fistulagram in the cath lab today.   CHIEF COMPLAINT: poorly functioning fistula  HISTORY OF PRESENT ILLNESS: Allen Mathis is a 58 y.o. male with poorly functioning left arm Cimino type AV fistula.  He has undergone multiple endovascular interventions for this in the past.  The fistula is working well as long as certain techs access the fistula.  His nephrologist is reporting low flow rates to him.  08/16/24: patient returns to dialysis access center reporting poor function of his fistula. We reviewed plan for fistulagram.  Past Medical History:  Diagnosis Date   Asthma    Chronic kidney disease    COPD (chronic obstructive pulmonary disease) (HCC)    Diabetes mellitus without complication (HCC)    Hypertension     Past Surgical History:  Procedure Laterality Date   A/V FISTULAGRAM Left 06/19/2022   Procedure: A/V Fistulagram;  Surgeon: Lanis Fonda BRAVO, MD;  Location: [facility] INVASIVE CV LAB;  Service: Cardiovascular;  Laterality: Left;   A/V FISTULAGRAM N/A 10/03/2023   Procedure: A/V Fistulagram;  Surgeon: Melia Lynwood ORN, MD;  Location: [facility] INVASIVE CV LAB;  Service: Cardiovascular;  Laterality: N/A;   A/V SHUNT INTERVENTION N/A 02/23/2024   Procedure: A/V SHUNT INTERVENTION;  Surgeon: Magda Debby SAILOR, MD;  Location: [facility] PV LAB;  Service: Cardiovascular;  Laterality: N/A;   AV FISTULA PLACEMENT Left 11/09/2021   Procedure: LEFT ARM ARTERIOVENOUS (AV) FISTULA CREATION ;  Surgeon: Eliza Lonni RAMAN, MD;  Location: [facility] OR;  Service: Vascular;  Laterality: Left;   BIOPSY  11/08/2021   Procedure: BIOPSY;  Surgeon: Rollin Dover, MD;  Location: [facility] ENDOSCOPY;  Service: Endoscopy;;   COLONOSCOPY WITH PROPOFOL  N/A 11/08/2021   Procedure: COLONOSCOPY WITH PROPOFOL ;  Surgeon: Rollin Dover, MD;  Location: [facility] ENDOSCOPY;  Service:  Endoscopy;  Laterality: N/A;   ESOPHAGOGASTRODUODENOSCOPY (EGD) WITH PROPOFOL  N/A 11/08/2021   Procedure: ESOPHAGOGASTRODUODENOSCOPY (EGD) WITH PROPOFOL ;  Surgeon: Rollin Dover, MD;  Location: [facility] ENDOSCOPY;  Service: Endoscopy;  Laterality: N/A;   IR FLUORO GUIDE CV LINE RIGHT  11/02/2021   PERIPHERAL VASCULAR BALLOON ANGIOPLASTY  10/03/2023   Procedure: PERIPHERAL VASCULAR BALLOON ANGIOPLASTY;  Surgeon: Melia Lynwood ORN, MD;  Location: [facility] INVASIVE CV LAB;  Service: Cardiovascular;;   POLYPECTOMY  11/08/2021   Procedure: POLYPECTOMY;  Surgeon: Rollin Dover, MD;  Location: [facility] ENDOSCOPY;  Service: Endoscopy;;   VENOUS ANGIOPLASTY  02/23/2024   Procedure: VENOUS ANGIOPLASTY;  Surgeon: Magda Debby SAILOR, MD;  Location: [facility] PV LAB;  Service: Cardiovascular;;    Family History  Problem Relation Age of Onset   Diabetes Mother    Diabetes Father     Social History   Socioeconomic History   Marital status: Single    Spouse name: Not on file   Number of children: Not on file   Years of education: Not on file   Highest education level: Not on file  Occupational History   Not on file  Tobacco Use   Smoking status: Some Days    Current packs/day: 2.00    Types: Cigarettes   Smokeless tobacco: Current    Types: Snuff  Vaping Use   Vaping status: Never Used  Substance and Sexual Activity   Alcohol use: Yes   Drug use: Yes    Types: Marijuana   Sexual activity: Not on file  Other Topics Concern   Not on file  Social History Narrative   Not on file   Social Drivers of Health   Financial Resource Strain: Not on file  Food Insecurity: Not on file  Transportation Needs: Not on file  Physical Activity: Not on file  Stress: Not on file  Social Connections: Not on file  Intimate Partner Violence: Not on file    No Known Allergies  Current Facility-Administered Medications  Medication Dose Route Frequency Provider Last Rate Last Admin   Heparin  (Porcine) in NaCl 1000-0.9 UT/500ML-% SOLN     PRN Magda Debby SAILOR, MD   500 mL at 08/16/24 0908   iodixanol  (VISIPAQUE ) 320 MG/ML injection    PRN Magda Debby SAILOR, MD   25 mL at 08/16/24 0933   lidocaine  (PF) (XYLOCAINE ) 1 % injection    PRN Magda Debby SAILOR, MD   2 mL at 08/16/24 0920    PHYSICAL EXAM Vitals:   08/16/24 0918 08/16/24 0922 08/16/24 0927 08/16/24 0935  BP: (!) 118/58 126/65 129/63 (!) 144/75  Pulse: 62 60 (!) 58 60  Resp: (!) 22 (!) 21 20 12   Temp:      TempSrc:      SpO2: 97% 96% 97% 95%   Well-appearing man in no distress Regular rate and rhythm Unlabored breathing Left arm forearm fistula with both areas of chronic cannulation.  There is a thrill in the fistula.   PERTINENT LABORATORY AND RADIOLOGIC DATA  Most recent CBC    Latest Ref Rng & Units 06/19/2022   11:08 AM 11/07/2021    2:29 AM 11/06/2021    3:08 AM  CBC  WBC 4.0 - 10.5 K/uL  7.7  7.8   Hemoglobin 13.0 - 17.0 g/dL 87.0  9.4  9.0   Hematocrit 39.0 - 52.0 % 38.0  27.1  26.2   Platelets 150 - 400 K/uL  128  134      Most recent CMP    Latest Ref Rng & Units 06/19/2022   11:08 AM 11/09/2021    1:45 AM 11/08/2021    1:11 AM  CMP  Glucose 70 - 99 mg/dL 96  874  88   BUN 6 - 20 mg/dL 35  25  43   Creatinine 0.61 - 1.24 mg/dL 1.59  5.00  2.78   Sodium 135 - 145 mmol/L 138  135  136   Potassium 3.5 - 5.1 mmol/L 5.5  3.6  3.8   Chloride 98 - 111 mmol/L 101  100  100   CO2 22 - 32 mmol/L  24  23   Calcium  8.9 - 10.3 mg/dL  8.4  8.7    Deshanna Kama N. Magda, MD FACS [facility] Phone Number: [phone] 08/16/2024 9:40 AM   Total time spent on preparing this encounter including chart review, data review, collecting history, examining the patient, and coordinating care: 60 minutes.  Portions of this report may have been transcribed using voice recognition software.  Every effort has been made to ensure accuracy; however, inadvertent computerized transcription errors may still be present.

## 2024-08-16 NOTE — Op Note (Signed)
 DATE OF SERVICE: 08/16/2024  PATIENT:  Allen Mathis  58 y.o. male  PRE-OPERATIVE DIAGNOSIS:  end-stage renal disease  POST-OPERATIVE DIAGNOSIS:  Same  PROCEDURE:   1) Ultrasound guided left arm AVF access (CPT [phone]) 2) fistulagram with peripheral angioplasty - 7 x 60mm Mustang (CPT [phone]) 3) established outpatient evaluation and management - level 3 (CPT 99213)  SURGEON:  Debby SAILOR. Magda, MD  ASSISTANT: none  ANESTHESIA:   local  ESTIMATED BLOOD LOSS: min  LOCAL MEDICATIONS USED:  LIDOCAINE    COUNTS: confirmed correct.  PATIENT DISPOSITION:  PACU - hemodynamically stable.   Delay start of Pharmacological VTE agent (>24hrs) due to surgical blood loss or risk of bleeding: no  INDICATION FOR PROCEDURE: Allen Mathis is a 58 y.o. male with End-stage renal disease on dialysis through a left arm AV fistula.  The fistula is performing poorly at dialysis.  After careful discussion of risks, benefits, and alternatives the patient was offered fistulogram. The patient understood and wished to proceed.  OPERATIVE FINDINGS:  Left upper extremity Central venous: No stenosis Subclavian vein: No stenosis Axillary vein: No stenosis Fistula: 80% stenosis in the area of chronic cannulation in the mid forearm, no other stenosis seen in the fistula or the outflow in the antecubitum or upper arm Anastomosis: No stenosis  DESCRIPTION OF PROCEDURE: After identification of the patient in the pre-operative holding area, the patient was transferred to the operating room. The patient was positioned supine on the operating room table.  The left upper extremity was prepped and draped in standard fashion. A surgical pause was performed confirming correct patient, procedure, and operative location.  The left upper extremity was anesthetized with subcutaneous injection of 1% lidocaine  over the area of planned access. Using ultrasound guidance, the left upper extremity dialysis access was accessed  with micropuncture technique.  Fistulogram was performed in stations with the micro sheath.  See above for details.  The decision was made to intervene.  The lesion was crossed with a Bentson wire.  Access was upsized to 6 French.  A 7 x 60 mm Mustang balloon was used to cross the lesion and angioplasty performed.  Good technical result was achieved on follow-up angiogram.  All endovascular equipment was removed.  A figure-of-eight stitch was applied to the exit site with good hemostasis.  Sterile bandage was applied.  Upon completion of the case instrument and sharps counts were confirmed correct. The patient was transferred to the PACU in good condition. I was present for all portions of the procedure.  PLAN: Good result from fistulogram.  Okay to use fistula.  Fistula remains amenable to percutaneous intervention.  Follow-up with us  as needed for fistula related issues.  Debby SAILOR. Magda, MD [facility] Phone Number: [phone] 08/16/2024 9:42 AM

## 2024-08-17 ENCOUNTER — Encounter (HOSPITAL_COMMUNITY): Payer: Self-pay | Admitting: Vascular Surgery

## 2024-08-17 DIAGNOSIS — N186 End stage renal disease: Secondary | ICD-10-CM | POA: Diagnosis not present

## 2024-08-17 DIAGNOSIS — D631 Anemia in chronic kidney disease: Secondary | ICD-10-CM | POA: Diagnosis not present

## 2024-08-17 DIAGNOSIS — D689 Coagulation defect, unspecified: Secondary | ICD-10-CM | POA: Diagnosis not present

## 2024-08-17 DIAGNOSIS — N2581 Secondary hyperparathyroidism of renal origin: Secondary | ICD-10-CM | POA: Diagnosis not present

## 2024-08-17 DIAGNOSIS — Z992 Dependence on renal dialysis: Secondary | ICD-10-CM | POA: Diagnosis not present

## 2024-08-17 MED FILL — Lidocaine HCl Local Preservative Free (PF) Inj 1%: INTRAMUSCULAR | Qty: 30 | Status: CN

## 2024-08-24 DIAGNOSIS — N2581 Secondary hyperparathyroidism of renal origin: Secondary | ICD-10-CM | POA: Diagnosis not present

## 2024-08-24 DIAGNOSIS — D689 Coagulation defect, unspecified: Secondary | ICD-10-CM | POA: Diagnosis not present

## 2024-08-24 DIAGNOSIS — N186 End stage renal disease: Secondary | ICD-10-CM | POA: Diagnosis not present

## 2024-08-24 DIAGNOSIS — Z992 Dependence on renal dialysis: Secondary | ICD-10-CM | POA: Diagnosis not present

## 2024-08-24 DIAGNOSIS — D631 Anemia in chronic kidney disease: Secondary | ICD-10-CM | POA: Diagnosis not present

## 2024-08-30 DIAGNOSIS — N186 End stage renal disease: Secondary | ICD-10-CM | POA: Diagnosis not present

## 2024-08-30 DIAGNOSIS — N2581 Secondary hyperparathyroidism of renal origin: Secondary | ICD-10-CM | POA: Diagnosis not present

## 2024-08-30 DIAGNOSIS — Z992 Dependence on renal dialysis: Secondary | ICD-10-CM | POA: Diagnosis not present

## 2024-08-30 DIAGNOSIS — D689 Coagulation defect, unspecified: Secondary | ICD-10-CM | POA: Diagnosis not present

## 2024-09-05 DIAGNOSIS — E1122 Type 2 diabetes mellitus with diabetic chronic kidney disease: Secondary | ICD-10-CM | POA: Diagnosis not present

## 2024-09-05 DIAGNOSIS — Z992 Dependence on renal dialysis: Secondary | ICD-10-CM | POA: Diagnosis not present

## 2024-09-05 DIAGNOSIS — N186 End stage renal disease: Secondary | ICD-10-CM | POA: Diagnosis not present

## 2024-09-07 DIAGNOSIS — D689 Coagulation defect, unspecified: Secondary | ICD-10-CM | POA: Diagnosis not present

## 2024-09-07 DIAGNOSIS — N2581 Secondary hyperparathyroidism of renal origin: Secondary | ICD-10-CM | POA: Diagnosis not present

## 2024-09-07 DIAGNOSIS — Z992 Dependence on renal dialysis: Secondary | ICD-10-CM | POA: Diagnosis not present
